# Patient Record
Sex: Female | Born: 1950 | Race: White | Hispanic: No | Marital: Married | Smoking: Never smoker
Health system: Southern US, Community
[De-identification: ages and names within clinical notes are randomized; demographics above are authoritative.]

## PROBLEM LIST (undated history)

## (undated) DIAGNOSIS — I10 Essential (primary) hypertension: Secondary | ICD-10-CM

## (undated) DIAGNOSIS — E785 Hyperlipidemia, unspecified: Secondary | ICD-10-CM

## (undated) DIAGNOSIS — K449 Diaphragmatic hernia without obstruction or gangrene: Secondary | ICD-10-CM

## (undated) DIAGNOSIS — F419 Anxiety disorder, unspecified: Secondary | ICD-10-CM

## (undated) DIAGNOSIS — E538 Deficiency of other specified B group vitamins: Secondary | ICD-10-CM

## (undated) DIAGNOSIS — K5190 Ulcerative colitis, unspecified, without complications: Secondary | ICD-10-CM

## (undated) DIAGNOSIS — K219 Gastro-esophageal reflux disease without esophagitis: Secondary | ICD-10-CM

## (undated) DIAGNOSIS — N3281 Overactive bladder: Secondary | ICD-10-CM

## (undated) DIAGNOSIS — J320 Chronic maxillary sinusitis: Secondary | ICD-10-CM

## (undated) DIAGNOSIS — R748 Abnormal levels of other serum enzymes: Secondary | ICD-10-CM

## (undated) DIAGNOSIS — K222 Esophageal obstruction: Secondary | ICD-10-CM

## (undated) DIAGNOSIS — K6289 Other specified diseases of anus and rectum: Secondary | ICD-10-CM

## (undated) DIAGNOSIS — M1990 Unspecified osteoarthritis, unspecified site: Secondary | ICD-10-CM

## (undated) HISTORY — PX: CATARACT EXTRACTION: SUR2

## (undated) HISTORY — DX: Essential (primary) hypertension: I10

## (undated) HISTORY — DX: Gastro-esophageal reflux disease without esophagitis: K21.9

## (undated) HISTORY — DX: Ulcerative colitis, unspecified, without complications: K51.90

## (undated) HISTORY — DX: Chronic maxillary sinusitis: J32.0

## (undated) HISTORY — DX: Diaphragmatic hernia without obstruction or gangrene: K44.9

## (undated) HISTORY — DX: Other specified diseases of anus and rectum: K62.89

## (undated) HISTORY — DX: Hyperlipidemia, unspecified: E78.5

## (undated) HISTORY — DX: Unspecified osteoarthritis, unspecified site: M19.90

## (undated) HISTORY — PX: NOSE SURGERY: SHX723

## (undated) HISTORY — DX: Anxiety disorder, unspecified: F41.9

## (undated) HISTORY — DX: Overactive bladder: N32.81

## (undated) HISTORY — DX: Abnormal levels of other serum enzymes: R74.8

## (undated) HISTORY — PX: OTHER SURGICAL HISTORY: SHX169

## (undated) HISTORY — DX: Deficiency of other specified B group vitamins: E53.8

## (undated) HISTORY — DX: Esophageal obstruction: K22.2

---

## 1997-09-18 ENCOUNTER — Emergency Department (HOSPITAL_COMMUNITY): Admission: EM | Admit: 1997-09-18 | Discharge: 1997-09-18 | Payer: Self-pay | Admitting: Emergency Medicine

## 1998-01-04 ENCOUNTER — Encounter: Payer: Self-pay | Admitting: Obstetrics and Gynecology

## 1998-01-04 ENCOUNTER — Ambulatory Visit (HOSPITAL_COMMUNITY): Admission: RE | Admit: 1998-01-04 | Discharge: 1998-01-04 | Payer: Self-pay | Admitting: Obstetrics and Gynecology

## 1998-10-18 ENCOUNTER — Ambulatory Visit (HOSPITAL_COMMUNITY): Admission: RE | Admit: 1998-10-18 | Discharge: 1998-10-18 | Payer: Self-pay | Admitting: Gastroenterology

## 1998-10-18 ENCOUNTER — Encounter: Payer: Self-pay | Admitting: Gastroenterology

## 1998-11-07 ENCOUNTER — Encounter (INDEPENDENT_AMBULATORY_CARE_PROVIDER_SITE_OTHER): Payer: Self-pay | Admitting: Specialist

## 1998-11-07 ENCOUNTER — Other Ambulatory Visit: Admission: RE | Admit: 1998-11-07 | Discharge: 1998-11-07 | Payer: Self-pay | Admitting: Gastroenterology

## 1999-01-23 ENCOUNTER — Ambulatory Visit (HOSPITAL_COMMUNITY): Admission: RE | Admit: 1999-01-23 | Discharge: 1999-01-23 | Payer: Self-pay | Admitting: Obstetrics and Gynecology

## 1999-01-23 ENCOUNTER — Encounter: Payer: Self-pay | Admitting: Obstetrics and Gynecology

## 1999-01-24 ENCOUNTER — Other Ambulatory Visit: Admission: RE | Admit: 1999-01-24 | Discharge: 1999-01-24 | Payer: Self-pay | Admitting: Obstetrics and Gynecology

## 2000-01-25 ENCOUNTER — Ambulatory Visit (HOSPITAL_COMMUNITY): Admission: RE | Admit: 2000-01-25 | Discharge: 2000-01-25 | Payer: Self-pay | Admitting: Obstetrics and Gynecology

## 2000-01-25 ENCOUNTER — Encounter: Payer: Self-pay | Admitting: Obstetrics and Gynecology

## 2000-01-31 ENCOUNTER — Other Ambulatory Visit: Admission: RE | Admit: 2000-01-31 | Discharge: 2000-01-31 | Payer: Self-pay | Admitting: Obstetrics and Gynecology

## 2001-12-16 ENCOUNTER — Encounter: Payer: Self-pay | Admitting: Obstetrics and Gynecology

## 2001-12-16 ENCOUNTER — Other Ambulatory Visit: Admission: RE | Admit: 2001-12-16 | Discharge: 2001-12-16 | Payer: Self-pay | Admitting: Obstetrics and Gynecology

## 2001-12-16 ENCOUNTER — Ambulatory Visit (HOSPITAL_COMMUNITY): Admission: RE | Admit: 2001-12-16 | Discharge: 2001-12-16 | Payer: Self-pay | Admitting: Obstetrics and Gynecology

## 2004-11-02 ENCOUNTER — Encounter: Admission: RE | Admit: 2004-11-02 | Discharge: 2004-11-02 | Payer: Self-pay | Admitting: Obstetrics and Gynecology

## 2005-09-16 ENCOUNTER — Inpatient Hospital Stay (HOSPITAL_COMMUNITY): Admission: EM | Admit: 2005-09-16 | Discharge: 2005-09-19 | Payer: Self-pay | Admitting: Family Medicine

## 2005-09-16 ENCOUNTER — Ambulatory Visit: Payer: Self-pay | Admitting: Family Medicine

## 2007-02-25 ENCOUNTER — Ambulatory Visit: Payer: Self-pay | Admitting: Gastroenterology

## 2007-02-25 DIAGNOSIS — K5150 Left sided colitis without complications: Secondary | ICD-10-CM

## 2007-03-10 ENCOUNTER — Encounter: Payer: Self-pay | Admitting: Gastroenterology

## 2007-03-10 ENCOUNTER — Ambulatory Visit: Payer: Self-pay | Admitting: Gastroenterology

## 2007-03-14 ENCOUNTER — Ambulatory Visit: Payer: Self-pay | Admitting: Gastroenterology

## 2007-03-14 LAB — CONVERTED CEMR LAB
ALT: 22 units/L (ref 0–35)
AST: 18 units/L (ref 0–37)
Albumin: 3.7 g/dL (ref 3.5–5.2)
Albumin: 4 g/dL (ref 3.5–5.2)
Alkaline Phosphatase: 96 units/L (ref 39–117)
Alkaline Phosphatase: 87 units/L (ref 39–117)
BUN: 11 mg/dL (ref 6–23)
BUN: 11 mg/dL (ref 6–23)
Basophils Absolute: 0.1 10*3/uL (ref 0.0–0.1)
Basophils Relative: 1.5 % — ABNORMAL HIGH (ref 0.0–1.0)
CO2: 28 meq/L (ref 19–32)
Calcium: 8.9 mg/dL (ref 8.4–10.5)
Chloride: 104 meq/L (ref 96–112)
Ferritin: 158.7 ng/mL (ref 10.0–291.0)
Folate: 10.1 ng/mL
GFR calc Af Amer: 134 mL/min
GFR calc Af Amer: 112 mL/min
GFR calc non Af Amer: 93 mL/min
Hemoglobin: 13.6 g/dL (ref 12.0–15.0)
Iron: 44 ug/dL (ref 42–145)
MCHC: 34.9 g/dL (ref 30.0–36.0)
Monocytes Absolute: 0.5 10*3/uL (ref 0.2–0.7)
Monocytes Relative: 6.5 % (ref 3.0–11.0)
Potassium: 3.6 meq/L (ref 3.5–5.1)
RBC: 4.54 M/uL (ref 3.87–5.11)
RDW: 12 % (ref 11.5–14.6)
Saturation Ratios: 14.1 % — ABNORMAL LOW (ref 20.0–50.0)
Saturation Ratios: 13.7 % — ABNORMAL LOW (ref 20.0–50.0)
Sodium: 141 meq/L (ref 135–145)
TSH: 0.83 microintl units/mL (ref 0.35–5.50)
Total Protein: 6.8 g/dL (ref 6.0–8.3)
Transferrin: 222.9 mg/dL (ref >=212.0)
Vitamin B-12: 389 pg/mL (ref 211–911)

## 2007-04-24 DIAGNOSIS — M810 Age-related osteoporosis without current pathological fracture: Secondary | ICD-10-CM | POA: Insufficient documentation

## 2007-04-24 DIAGNOSIS — K219 Gastro-esophageal reflux disease without esophagitis: Secondary | ICD-10-CM

## 2007-04-24 DIAGNOSIS — J320 Chronic maxillary sinusitis: Secondary | ICD-10-CM

## 2007-11-28 ENCOUNTER — Telehealth: Payer: Self-pay | Admitting: Gastroenterology

## 2008-01-26 ENCOUNTER — Telehealth: Payer: Self-pay | Admitting: Gastroenterology

## 2008-01-29 DIAGNOSIS — F411 Generalized anxiety disorder: Secondary | ICD-10-CM | POA: Insufficient documentation

## 2008-01-29 DIAGNOSIS — I10 Essential (primary) hypertension: Secondary | ICD-10-CM

## 2008-01-30 ENCOUNTER — Ambulatory Visit: Payer: Self-pay | Admitting: Gastroenterology

## 2008-01-30 DIAGNOSIS — E538 Deficiency of other specified B group vitamins: Secondary | ICD-10-CM | POA: Insufficient documentation

## 2008-02-06 ENCOUNTER — Ambulatory Visit: Payer: Self-pay | Admitting: Gastroenterology

## 2008-04-01 ENCOUNTER — Encounter: Admission: RE | Admit: 2008-04-01 | Discharge: 2008-04-01 | Payer: Self-pay | Admitting: Emergency Medicine

## 2009-12-06 ENCOUNTER — Emergency Department (HOSPITAL_COMMUNITY): Admission: EM | Admit: 2009-12-06 | Discharge: 2009-12-07 | Payer: Self-pay | Admitting: Emergency Medicine

## 2010-03-30 ENCOUNTER — Encounter: Payer: Self-pay | Admitting: Gastroenterology

## 2010-04-07 ENCOUNTER — Ambulatory Visit
Admission: RE | Admit: 2010-04-07 | Discharge: 2010-04-07 | Payer: Self-pay | Source: Home / Self Care | Attending: Gastroenterology | Admitting: Gastroenterology

## 2010-04-07 ENCOUNTER — Encounter (INDEPENDENT_AMBULATORY_CARE_PROVIDER_SITE_OTHER): Payer: Self-pay | Admitting: *Deleted

## 2010-04-07 ENCOUNTER — Other Ambulatory Visit: Payer: Self-pay | Admitting: Gastroenterology

## 2010-04-07 ENCOUNTER — Encounter: Payer: Self-pay | Admitting: Gastroenterology

## 2010-04-07 ENCOUNTER — Ambulatory Visit: Payer: Self-pay | Admitting: Internal Medicine

## 2010-04-07 DIAGNOSIS — R740 Nonspecific elevation of levels of transaminase and lactic acid dehydrogenase [LDH]: Secondary | ICD-10-CM

## 2010-04-07 DIAGNOSIS — K222 Esophageal obstruction: Secondary | ICD-10-CM | POA: Insufficient documentation

## 2010-04-07 DIAGNOSIS — K5120 Ulcerative (chronic) proctitis without complications: Secondary | ICD-10-CM | POA: Insufficient documentation

## 2010-04-07 DIAGNOSIS — M129 Arthropathy, unspecified: Secondary | ICD-10-CM | POA: Insufficient documentation

## 2010-04-07 DIAGNOSIS — K449 Diaphragmatic hernia without obstruction or gangrene: Secondary | ICD-10-CM | POA: Insufficient documentation

## 2010-04-07 DIAGNOSIS — R1012 Left upper quadrant pain: Secondary | ICD-10-CM | POA: Insufficient documentation

## 2010-04-07 LAB — CONVERTED CEMR LAB
Anti Nuclear Antibody(ANA): NEGATIVE
HCV Ab: NEGATIVE

## 2010-04-07 LAB — COMPREHENSIVE METABOLIC PANEL
ALT: 47 U/L — ABNORMAL HIGH (ref 0–35)
AST: 26 U/L (ref 0–37)
Albumin: 4.1 g/dL (ref 3.5–5.2)
Alkaline Phosphatase: 101 U/L (ref 39–117)
BUN: 15 mg/dL (ref 6–23)
CO2: 29 mEq/L (ref 19–32)
Calcium: 9.1 mg/dL (ref 8.4–10.5)
Chloride: 105 mEq/L (ref 96–112)
Creatinine, Ser: 0.8 mg/dL (ref 0.4–1.2)
GFR: 74.17 mL/min (ref >60.00)
Glucose, Bld: 81 mg/dL (ref 70–99)
Potassium: 4 mEq/L (ref 3.5–5.1)
Sodium: 141 mEq/L (ref 135–145)
Total Bilirubin: 1 mg/dL (ref 0.3–1.2)
Total Protein: 6.9 g/dL (ref 6.0–8.3)

## 2010-04-07 LAB — AMYLASE: Amylase: 56 U/L (ref 27–131)

## 2010-04-07 LAB — FOLATE: Folate: 15.5 ng/mL

## 2010-04-07 LAB — IBC PANEL
Iron: 115 ug/dL (ref 42–145)
Saturation Ratios: 29.2 % (ref 20.0–50.0)
Transferrin: 281 mg/dL (ref 212.0–360.0)

## 2010-04-07 LAB — FERRITIN: Ferritin: 121.3 ng/mL (ref 10.0–291.0)

## 2010-04-07 LAB — LIPASE: Lipase: 30 U/L (ref 11.0–59.0)

## 2010-04-07 LAB — VITAMIN B12: Vitamin B-12: 285 pg/mL (ref 211–911)

## 2010-04-07 LAB — SEDIMENTATION RATE: Sed Rate: 20 mm/hr (ref 0–22)

## 2010-04-07 LAB — HIGH SENSITIVITY CRP: CRP, High Sensitivity: 2.34 mg/L (ref 0.00–5.00)

## 2010-04-09 ENCOUNTER — Telehealth: Payer: Self-pay | Admitting: Gastroenterology

## 2010-04-12 ENCOUNTER — Encounter: Payer: Self-pay | Admitting: Gastroenterology

## 2010-04-12 ENCOUNTER — Ambulatory Visit
Admission: RE | Admit: 2010-04-12 | Discharge: 2010-04-12 | Payer: Self-pay | Source: Home / Self Care | Attending: Gastroenterology | Admitting: Gastroenterology

## 2010-04-18 ENCOUNTER — Encounter: Payer: Self-pay | Admitting: Gastroenterology

## 2010-04-18 ENCOUNTER — Telehealth: Payer: Self-pay | Admitting: Gastroenterology

## 2010-05-02 ENCOUNTER — Ambulatory Visit
Admission: RE | Admit: 2010-05-02 | Discharge: 2010-05-02 | Payer: Self-pay | Source: Home / Self Care | Attending: Gastroenterology | Admitting: Gastroenterology

## 2010-05-02 DIAGNOSIS — R0789 Other chest pain: Secondary | ICD-10-CM | POA: Insufficient documentation

## 2010-05-04 NOTE — Progress Notes (Signed)
Summary: Questions   Phone Note Call from Patient Call back at [phone]   Caller: Patient Call For: Dr. Sharlett Iles Reason for Call: Talk to Nurse Summary of Call: Pt just got the machine we ordered for her to help with her lung and she does not know how to use it, would like to speak to nurse Initial call taken by: Martinique Johnson,  April 18, 2010 8:54 AM  Follow-up for Phone Call        advised pt that per Dr. Sharlett Iles she can return the machine and just take deep breaths. She veralized understanding and will come to her appt at the end of the month. Follow-up by: Bernita Buffy CMA Deborra Medina),  April 18, 2010 9:39 AM

## 2010-05-04 NOTE — Assessment & Plan Note (Signed)
Summary: increased lts/lk    History of Present Illness Visit Type: Initial Consult Primary GI MD: Verl Blalock MD FACP FAGA Primary Provider: Nena Jordan, MD  Requesting Provider: Nena Jordan, MD  Chief Complaint: Elevated liver function test  History of Present Illness:   Jessica Stephens is a 60 year old Caucasian female lab none for many years with left-sided ulcerative colitis. Last colonoscopy was in 2008 and showed mild activity but no dysplasia on screening biopsies. She has been maintained on Asacol 4 mg twice a day which she apparently does not take regularly. 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 basis. She is followed by Dr. Ivar Bury and is on lisinopril and Amiodipine or hypertension.  She recently has had left-sided pleuritic chest pain which prompted emergency room visit at Thanksgiving. Apparently chest x-ray, EKG, and general exam was unremarkable. She apparently was recently discovered to have a urinary tract infection and was treated with antibiotics by primary care. She denies any specific genitourinary, cardiovascular, or pulmonary complaints at this time except for left-sided chest pain worsening with deep inspiration. She denies worsening acid reflux, dysphagia, or flare of her colitis. In fact, she has constipation with some gas and bloating, but denies rectal bleeding, fever, chills, anorexia or weight loss.  As part of her workup she been found to have mildly elevated serum transaminases approximately twice normal. She has no known hepatitis exposure, and liver enzymes in 2008 were normal. She does have osteoarthritis, osteoporosis, has recently discontinued her calcium, vitamin D, and Fosamax. She does use ethanol apparently fairly heavily. She denies a known history of hepatitis or pancreatitis. Her main complaint today is chronic fatigue. There is no history of mental status changes, pleuritis,  icterus, or known liver or gallbladder disease. She does suffer from chronic anxiety syndrome.   GI Review of Systems      Denies abdominal pain, acid reflux, belching, bloating, chest pain, dysphagia with liquids, dysphagia with solids, heartburn, loss of appetite, nausea, vomiting, vomiting blood, weight loss, and  weight gain.      Reports liver problems.     Denies anal fissure, black tarry stools, change in bowel habit, constipation, diarrhea, diverticulosis, fecal incontinence, heme positive stool, hemorrhoids, irritable bowel syndrome, jaundice, light color stool, rectal bleeding, and  rectal pain.    Current Medications (verified): 1)  Asacol 400 Mg  Tbec (Mesalamine) .... Take Two By Mouth Once Daily 2)  Nexium 40 Mg  Cpdr (Esomeprazole Magnesium) .... As Needed 3)  Lisinopril 10 Mg Tabs (Lisinopril) .Marland Kitchen.. 1 Tablet By Mouth Once Daily 4)  Amlodipine Besylate 10 Mg Tabs (Amlodipine Besylate) .Marland Kitchen.. 1 Tablet By Mouth Once Daily  Allergies (verified): 1)  ! Sulfa  Past History:  Past medical, surgical, family and social histories (including risk factors) reviewed for relevance to current acute and chronic problems.  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 c-section x 2 Nose surgery  Family History: Reviewed history from 01/29/2008 and no changes required. No FH of Colon Cancer: Family History of Diabetes: father Family History of Heart Disease:   Social History: Reviewed history from 01/30/2008 and no changes required. 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  Alcohol Use - no Illicit Drug Use - no Daily Caffeine Use  Review of Systems       The patient complains of arthritis/joint pain, back pain, blood in urine, and fatigue.  The patient denies allergy/sinus, anemia, anxiety-new, breast changes/lumps, change in vision, confusion, cough, coughing up blood, depression-new, fainting, fever, headaches-new, hearing problems, heart murmur, heart rhythm changes, itching, menstrual pain, muscle pains/cramps, night sweats, nosebleeds, pregnancy symptoms, shortness of breath, skin rash, sleeping problems, sore throat, swelling of feet/legs, swollen lymph glands, thirst - excessive , urination - excessive , urination changes/pain, urine leakage, vision changes, and voice change.    Vital Signs:  Patient profile:   60 year old female Height:      62 inches Weight:      160 pounds BMI:     29.37 BSA:     1.74 Pulse rate:   84 / minute Pulse rhythm:   regular BP sitting:   128 / 74  (left arm) Cuff size:   regular  Vitals Entered By: Hope Pigeon CMA (April 07, 2010 8:58 AM)  Physical Exam  General:  Well developed, well nourished, no acute distress.Somewhat agitated very verbose patient. Head:  Normocephalic and atraumatic. Eyes:  PERRLA, no icterus.exam deferred to patient's ophthalmologist.   Neck:  Supple; no masses or thyromegaly. Lungs:  Clear throughout to auscultation. Heart:  Regular rate and rhythm; no murmurs, rubs,  or bruits. Abdomen:  Soft, nontender and nondistended. No masses, hepatosplenomegaly or hernias noted. Normal bowel sounds.No CVA tenderness noted. Rectal:  deferred Msk:  Symmetrical with no gross deformities. Normal posture. Extremities:  No clubbing, cyanosis, edema or deformities noted. Neurologic:  Alert and  oriented x4;  grossly normal neurologically. Cervical Nodes:  No significant cervical adenopathy. Psych:  hyperactive and agitated.     Impression & Recommendations:  Problem # 1:  ULCERATIVE PROCTITIS  (ICD-556.2) Assessment Improved Continue Asacol as tolerated. She will need followup colonoscopy in the near future. I have ordered CT scan of the abdomen and pelvis to evaluate her rather severe left-sided pleuritic type chest pain. Labs also in order. I have given her p.r.n. tramadol 50 mg every 6-8 hours.She has been placed on MiraLax 8 ounces q.h.s. for constipation. Orders: TLB-CMP (Comprehensive Metabolic Pnl) (24097-DZHG) TLB-B12, Serum-Total ONLY (99242-A83) TLB-Ferritin (41962-IWL) TLB-Folic Acid (Folate) (79892-JJH) TLB-IBC Pnl (Iron/FE;Transferrin) (83550-IBC) TLB-Amylase (82150-AMYL) TLB-Lipase (83690-LIPASE) TLB-CRP-High Sensitivity (C-Reactive Protein) (86140-FCRP) TLB-Sedimentation Rate (ESR) (85652-ESR) T-ANA (41740-81448) T-Hepatitis B Surface Antigen (18563-14970) T-Hepatitis C Anti HCV (26378) T-CDT (carbohydrate deficient transferrin) (58850-27741) CT Chest/Abdomen w IV and Oral Contrast (CT CH/ABD w IV/Oral )  Problem # 2:  LUQ PAIN (ICD-789.02) Assessment: Deteriorated workup as per above.p.r.n. tramadol ordered. Orders: TLB-CMP (Comprehensive Metabolic Pnl) (28786-VEHM) TLB-B12, Serum-Total ONLY (09470-J62) TLB-Ferritin (83662-HUT) TLB-Folic Acid (Folate) (65465-KPT) TLB-IBC Pnl (Iron/FE;Transferrin) (83550-IBC) TLB-Amylase (82150-AMYL) TLB-Lipase (83690-LIPASE) TLB-CRP-High Sensitivity (C-Reactive Protein) (86140-FCRP) TLB-Sedimentation Rate (ESR) (85652-ESR) T-ANA (46568-12751) T-Hepatitis B Surface Antigen (70017-49449) T-Hepatitis C Anti HCV (67591) T-CDT (carbohydrate deficient transferrin) (63846-65993) CT Chest/Abdomen w IV and Oral Contrast (CT CH/ABD w IV/Oral )  Problem # 3:  VITAMIN B12 DEFICIENCY (ICD-266.2) Assessment: Improved repeat labs ordered  Problem # 4:  ANXIETY, CHRONIC (ICD-300.00) Assessment: Deteriorated Consider treatment for anxiety-depression.  Problem # 5:  HIATAL HERNIA (ICD-553.3) Assessment: Improved Patient uses  p.r.n. Nexium and apparently is asymptomatic at this time. However, some of her chest pain may be related to GERD, and I suspect she will need repeat endoscopy and colonoscopy in followup.  Problem # 6:  HYPERTENSION (ICD-401.9) Assessment: Improved Blood Pressure today is 120/74 and she is to continue her antihypertensives as per primary care.  Problem # 7:  TRANSAMINASES, SERUM, ELEVATED (ICD-790.4) Assessment: Deteriorated Etiology of elevated liver enzymes unclear, rule out autoimmune disease, chronic active hepatitis, iron storage disease, versus ethanol abuse and fatty liver versus occult hepatobiliary inflammatory processes. CT Scan and labs have been ordered. She is not on any medications that are typically associated with drug hepatitis. There is certainly no evidence of chronic liver disease on exam or evidence of hepatosplenomegaly.  Patient Instructions: 1)  Copy sent to : Nena Jordan, MD 2)  Please go to the basement today for your labs.  3)  Your CT Scan is scheduled for today, please follow the seperate instructions. 4)  Please continue current medications.  5)  Your prescription(s) have been sent to you pharmacy.  6)  Take Miralax at night. 7)  The medication list was reviewed and reconciled.  All changed / newly prescribed medications were explained.  A complete medication list was provided to the patient / caregiver. Prescriptions: TRAMADOL HCL 50 MG TABS (TRAMADOL HCL) take one by mouth two times a day as needed  #60 x 3   Entered by:   Bernita Buffy CMA ([place])   Authorized by:   Sable Feil MD [facility]   Signed by:   Bernita Buffy CMA (AAMA) on 04/07/2010   Method used:   Faxed to ...       [facility] (retail)       [address]       [place], [place]  [address]       Ph: [phone]       Fax: [phone]   [place]:   [phone]

## 2010-05-04 NOTE — Letter (Signed)
Summary: Patient [facility] Biopsy Results  [facility]  [place], [place] [address]   Phone: [phone]  Fax: [phone]        April 18, 2010 MRN: [id number]    Jessica Stephens [address] [place], [place]  [address]    Dear Ms. Liss,  I am pleased to inform you that the biopsies taken during your recent endoscopic examination did not show any evidence of cancer upon pathologic examination.Small bowel biopsies did not show evidence of celiac disease.  Additional information/recommendations:  __No further action is needed at this time.  Please follow-up with      your primary care physician for your other healthcare needs.  __ Please call [phone] to schedule a return visit to review      your condition.  _x_ Continue with the treatment plan as outlined on the day of your      exam.  __ You should have a repeat endoscopic examination for this problem              in _ months/years.   Please call us if you are having persistent problems or have questions about your condition that have not been fully answered at this time.  Sincerely,  Sable Feil MD [facility]  This letter has been electronically signed by your physician.  Appended Document: Patient Notice-Endo Biopsy Results Letter mailed

## 2010-05-04 NOTE — Letter (Signed)
Summary: [facility] Instructions  [facility]  [place], [place] [address]   Phone: [phone]  Fax: [phone]       Jessica Stephens    1950-07-17    MRN: [id number]        Procedure Day Sudie Grumbling: Wednesday 04-12-10     Arrival Time: 2:00 pm     Procedure Time: 3:00 pm     Location of Procedure:                    _x _  [place] (4th Floor)                        [place]   Starting 5 days prior to your procedure  04-07-10(today) do not eat nuts, seeds, popcorn, corn, beans, peas,  salads, or any raw vegetables.  Do not take any fiber supplements (e.g. Metamucil, Citrucel, and Benefiber).  THE DAY BEFORE YOUR PROCEDURE         DATE: 04-11-10   DAY:  Tuesday   1.  Drink clear liquids the entire day-NO SOLID FOOD  2.  Do not drink anything colored red or purple.  Avoid juices with pulp.  No orange juice.  3.  Drink at least 64 oz. (8 glasses) of fluid/clear liquids during the day to prevent dehydration and help the prep work efficiently.  CLEAR LIQUIDS INCLUDE: Water Jello Ice Popsicles Tea (sugar ok, no milk/cream) Powdered fruit flavored drinks Coffee (sugar ok, no milk/cream) Gatorade Juice: apple, white grape, white cranberry  Lemonade Clear bullion, consomm, broth Carbonated beverages (any kind) Strained chicken noodle soup Hard Candy                             4.  In the morning, mix first dose of MoviPrep solution:    Empty 1 Pouch A and 1 Pouch B into the disposable container    Add lukewarm drinking water to the top line of the container. Mix to dissolve    Refrigerate (mixed solution should be used within 24 hrs)  5.  Begin drinking the prep at 5:00 p.m. The MoviPrep container is divided by 4 marks.   Every 15 minutes drink the solution down to the next mark (approximately 8 oz) until the full liter is complete.   6.  Follow completed prep with 16 oz of clear liquid of your  choice (Nothing red or purple).  Continue to drink clear liquids until bedtime.  7.  Before going to bed, mix second dose of MoviPrep solution:    Empty 1 Pouch A and 1 Pouch B into the disposable container    Add lukewarm drinking water to the top line of the container. Mix to dissolve    Refrigerate  THE DAY OF YOUR PROCEDURE      DATE:  04-12-10  DAY:  Wednesday  Beginning at  10:00 a.m. (5 hours before procedure):         1. Every 15 minutes, drink the solution down to the next mark (approx 8 oz) until the full liter is complete.  2. Follow completed prep with 16 oz. of clear liquid of your choice.    3. You may drink clear liquids until  1:00 p.m. (2 HOURS BEFORE PROCEDURE).   MEDICATION INSTRUCTIONS  Unless otherwise instructed, you should take regular prescription medications with a small sip of water  as early as possible the morning of your procedure.           OTHER INSTRUCTIONS  You will need a responsible adult at least 58 years of age to accompany you and drive you home.   This person must remain in the waiting room during your procedure.  Wear loose fitting clothing that is easily removed.  Leave jewelry and other valuables at home.  However, you may wish to bring a book to read or  an iPod/MP3 player to listen to music as you wait for your procedure to start.  Remove all body piercing jewelry and leave at home.  Total time from sign-in until discharge is approximately 2-3 hours.  You should go home directly after your procedure and rest.  You can resume normal activities the  day after your procedure.  The day of your procedure you should not:   Drive   Make legal decisions   Operate machinery   Drink alcohol   Return to work  You will receive specific instructions about eating, activities and medications before you leave.    The above instructions have been reviewed and explained to me by   Sundra Aland RN  April 07, 2010 5:18  PM    I fully understand and can verbalize these instructions _____________________________ Date _________

## 2010-05-04 NOTE — Progress Notes (Signed)
   Phone Note Call from Patient   Caller: Patient Summary of Call: On call note. Pt called stating MoviPrep is too expensive and she wants another bowel prep. Change to generic PEG solution, 2 liter split dosing protocol, in place of MoviPrep. [facility] to [facility] on [address]. Initial call taken by: Ladene Artist MD Marval Regal,  April 09, 2010 11:44 AM

## 2010-05-04 NOTE — Miscellaneous (Signed)
Summary: Dexilant Rx and samples  Clinical Lists Changes  Medications: Changed medication from [place] 40 MG  CPDR (ESOMEPRAZOLE MAGNESIUM) as needed to DEXILANT 60 MG CPDR (DEXLANSOPRAZOLE) take one by mouth once daily - Signed Rx of DEXILANT 60 MG CPDR (DEXLANSOPRAZOLE) take one by mouth once daily;  #30 x 6;  Signed;  Entered by: Bernita Buffy CMA (AAMA);  Authorized by: Sable Feil MD [facility];  Method used: Electronically to [facility] [address] [place], [place], [place]  [address], Ph: [phone], Fax: [phone]    Prescriptions: DEXILANT 60 MG CPDR (DEXLANSOPRAZOLE) take one by mouth once daily  #30 x 6   Entered by:   Bernita Buffy CMA ([place])   Authorized by:   Sable Feil MD [facility]   Signed by:   Bernita Buffy CMA ([place]) on 04/12/2010   Method used:   Electronically to        [facility] (retail)       [address]       [place], [place]  [address]       Ph: [phone]       Fax: [phone]   [place]:   [phone]

## 2010-05-04 NOTE — Procedures (Signed)
Summary: Colonoscopy  Patient: Jessica Stephens Note: All result statuses are Final unless otherwise noted.  Tests: (1) Colonoscopy (COL)   COL Colonoscopy           [facility].     [place], [place]  [address]           COLONOSCOPY PROCEDURE REPORT           PATIENT:  [name], [name]  MR#:  [id number]     BIRTHDATE:  [birth year], 63 yrs. old  GENDER:  female     ENDOSCOPIST:  Loralee Pacas. Sharlett Iles, MD, [facility]     REF. BY:     PROCEDURE DATE:  04/12/2010     PROCEDURE:  Average-risk screening colonoscopy     G0121     ASA CLASS:  Class II     INDICATIONS:  Abdominal pain, extent of chronic inflammatory bowel     disease     MEDICATIONS:   Fentanyl 50 mcg IV, Versed 7 mg IV           DESCRIPTION OF PROCEDURE:   After the risks benefits and     alternatives of the procedure were thoroughly explained, informed     consent was obtained.  Digital rectal exam was performed and     revealed no abnormalities.   The LB 180AL [id number] endoscope was     introduced through the anus and advanced to the cecum, which was     identified by both the appendix and ileocecal valve, without     limitations.  The quality of the prep was excellent, using     MoviPrep.  The instrument was then slowly withdrawn as the colon     was fully examined.     <<PROCEDUREIMAGES>>           FINDINGS:  No polyps or cancers were seen.  This was otherwise a     normal examination of the colon. NO ACTIVE COLITIS NOTED.     Retroflexed views in the rectum revealed no abnormalities.    The     scope was then withdrawn from the patient and the procedure     completed.           COMPLICATIONS:  None     ENDOSCOPIC IMPRESSION:     1) No polyps or cancers     2) Otherwise normal examination     RECOMMENDATIONS:     1) Repeat Colonoscopy in 5 years.     CONTINUE MEDS     REPEAT EXAM:  No           ______________________________     Loralee Pacas. Sharlett Iles, MD, [facility]           CC:           n.     eSIGNED:   Loralee Pacas. Sriansh Farra at 04/12/2010 04:05 PM           [place], [id number]  Note: An exclamation mark (!) indicates a result that was not dispersed into the flowsheet. Document Creation Date: 04/12/2010 4:05 PM _______________________________________________________________________  (1) Order result status: Final Collection or observation date-time: 04/12/2010 15:59 Requested date-time:  Receipt date-time:  Reported date-time:  Referring Physician:   Ordering Physician: Verl Blalock [phone]) Specimen Source:  Source: Tawanna Cooler Order Number: [id number] Lab site:   Appended Document: Colonoscopy    Clinical Lists Changes  Observations: Added new observation of  COLONNXTDUE: 04/2015 (04/12/2010 16:50)      Appended Document: Colonoscopy     Procedures Next Due Date:    Colonoscopy: 04/2015

## 2010-05-04 NOTE — Procedures (Signed)
Summary: Upper Endoscopy  Patient: [name] Note: All result statuses are Final unless otherwise noted.  Tests: (1) Upper Endoscopy (EGD)   EGD Upper Endoscopy       [facility].     [place], [place]  [address]           ENDOSCOPY PROCEDURE REPORT           PATIENT:  Jessica Stephens, Jessica Stephens  MR#:  [id number]     BIRTHDATE:  September 20, 1950, 45 yrs. old  GENDER:  female           ENDOSCOPIST:  Loralee Pacas. Sharlett Iles, MD, [facility]     Referred by:           PROCEDURE DATE:  04/12/2010     PROCEDURE:  EGD with biopsy, [address]     ASA CLASS:  Class II     INDICATIONS:  LUQ PAIN.HX. OF GALLSTONES           MEDICATIONS:   There was residual sedation effect present from     prior procedure., Fentanyl 25 mcg IV     TOPICAL ANESTHETIC:           DESCRIPTION OF PROCEDURE:   After the risks benefits and     alternatives of the procedure were thoroughly explained, informed     consent was obtained.  The LB GIF-H180 [id number] endoscope was     introduced through the mouth and advanced to the second portion of     the duodenum, without limitations.  The instrument was slowly     withdrawn as the mucosa was fully examined.     <<PROCEDUREIMAGES>>           Multiple erosions were found in the distal esophagus. SEE     PICTURES.  A hiatal hernia was found. SMALL 2CM HH NOTED.  Normal     duodenal folds were noted. SI BX. DONE.  The stomach was entered     and closely examined. The antrum, angularis, and lesser curvature     were well visualized, including a retroflexed view of the cardia     and fundus. The stomach wall was normally distensable. The scope     passed easily through the pylorus into the duodenum.     Retroflexed views revealed no abnormalities.    The scope was then     withdrawn from the patient and the procedure completed.           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     RECOMMENDATIONS:     1) Await biopsy results     2) follow-up o.v first available     DEXILANT 60MG/DAILY.           REPEAT EXAM:  No           ______________________________     Loralee Pacas. Sharlett Iles, MD, [facility]           CC:           n.     eSIGNED:   Loralee Pacas.  [place] at 04/12/2010 04:18 PM           [place], [id number]  Note: An exclamation mark (!) indicates a result that was not dispersed into the flowsheet. Document Creation Date: 04/12/2010 4:19 PM _______________________________________________________________________  (1) Order result status: Final Collection or observation date-time: 04/12/2010 16:11 Requested date-time:  Receipt date-time:  Reported date-time:  Referring Physician:   Ordering Physician: Verl Blalock [phone]) Specimen Source:  Source: Tawanna Cooler Order Number: [id number] Lab site:

## 2010-05-04 NOTE — Miscellaneous (Signed)
Summary: previsit prep/rm  Clinical Lists Changes  Medications: Added new medication of MOVIPREP 100 GM  SOLR (PEG-KCL-NACL-NASULF-NA ASC-C) As per prep instructions. - Signed Rx of MOVIPREP 100 GM  SOLR (PEG-KCL-NACL-NASULF-NA ASC-C) As per prep instructions.;  #1 x 0;  Signed;  Entered by: Sundra Aland RN;  Authorized by: Sable Feil MD [facility];  Method used: [facility] to [facility] [address], [place], [place], [place]  [address], Ph: [phone], Fax: [phone] Observations: Added new observation of ALLERGY REV: Done (04/07/2010 15:44)    Prescriptions: MOVIPREP 100 GM  SOLR (PEG-KCL-NACL-NASULF-NA ASC-C) As per prep instructions.  #1 x 0   Entered by:   Sundra Aland RN   Authorized by:   Sable Feil MD [facility]   Signed by:   Sundra Aland RN on 04/07/2010   Method used:   Electronically to        [facility] (retail)       [address]       [place], [place]  [address]       Ph: [phone]       Fax: [phone]   [place]:   [phone]

## 2010-05-10 NOTE — Assessment & Plan Note (Signed)
Summary: follow up ECL and Chest CT/lk    History of Present Illness Visit Type: Follow-up Visit Primary GI MD: Verl Blalock MD FACP FAGA Primary Provider: Nena Jordan, MD  Requesting Provider: na Chief Complaint: F/u from endo/colon and chest CT. Pt states that she feels better and denies any GI complaints. Pt does have a question about Asacol and Dexilant  History of Present Illness:   Better,some gas,reflux improved GERD,works 3 jobs and eats frequently,stools.more.frequent,pulmonary.better,she was found to have atelectasis and is doing pulmonary inhalations. As part of her workup her CT scan showed calcifications in abdominal aorta, her father had a heart attack at age 11, she is treated for hypertension, and has a history of hyperlipidemia. Statins were apparently discontinued by Dr. Osvaldo Angst because of liver function test abnormalities. CT Scan did show small gallstones but no evidence of gallbladder infection, pancreatitis, or prominent biliary structures.  Hepatitis workup and workup for causes of metabolic liver disease were normal. She does have a borderline B12 level. Overall the patient is much improved with a reflux on Dexilant 60 mg a day. This also has helped her bowel pattern. Her colonoscopy showed that she is in remission in terms of her IBD. She does continue on low-dose aminosalicylate.   GI Review of Systems      Denies abdominal pain, acid reflux, belching, bloating, chest pain, dysphagia with liquids, dysphagia with solids, heartburn, loss of appetite, nausea, vomiting, vomiting blood, weight loss, and  weight gain.        Denies anal fissure, black tarry stools, change in bowel habit, constipation, diarrhea, diverticulosis, fecal incontinence, heme positive stool, hemorrhoids, irritable bowel syndrome, jaundice, light color stool, liver problems, rectal bleeding, and  rectal pain.    Current Medications (verified): 1)  Asacol 400 Mg  Tbec (Mesalamine) .... Take Two By  Mouth Once Daily 2)  Dexilant 60 Mg Cpdr (Dexlansoprazole) .... Take One By Mouth Once Daily 3)  Lisinopril 10 Mg Tabs (Lisinopril) .Marland Kitchen.. 1 Tablet By Mouth Once Daily 4)  Amlodipine Besylate 10 Mg Tabs (Amlodipine Besylate) .Marland Kitchen.. 1 Tablet By Mouth Once Daily 5)  Tramadol Hcl 50 Mg Tabs (Tramadol Hcl) .... Take One By Mouth Two Times A Day As Needed 6)  Miralax  Powd (Polyethylene Glycol 3350) .... Mix One Capful in 8 Ounces of Water and Drink At Night As Needed  Allergies (verified): 1)  ! Sulfa  Past History:  Past medical, surgical, family and social histories (including risk factors) reviewed for relevance to current acute and chronic problems.  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 c-section x 2 Nose surgery  Family History: Reviewed history from 01/29/2008 and no changes required. No FH of Colon Cancer: Family History of Diabetes: father Family History of Heart Disease:   Social History: Reviewed history from 01/30/2008 and no changes required. 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  Alcohol Use - no Illicit Drug Use - no Daily Caffeine Use  Review of Systems       The patient complains of fatigue and shortness of breath.  The patient denies allergy/sinus, anemia, anxiety-new, arthritis/joint pain, back pain, blood in urine, breast changes/lumps, change in vision, confusion, cough, coughing up  blood, depression-new, fainting, fever, headaches-new, hearing  problems, heart murmur, heart rhythm changes, itching, menstrual pain, muscle pains/cramps, night sweats, nosebleeds, pregnancy symptoms, skin rash, sleeping problems, sore throat, swelling of feet/legs, swollen lymph glands, thirst - excessive , urination - excessive , urination changes/pain, urine leakage, vision changes, and voice change.    Vital Signs:  Patient profile:   60 year old female Height:      62 inches Weight:      156 pounds BMI:     28.64 BSA:     1.72 Pulse rate:   88 / minute Pulse rhythm:   regular BP sitting:   128 / 74  (left arm) Cuff size:   regular  Vitals Entered By: Hope Pigeon CMA (May 02, 2010 10:01 AM)  Physical Exam  General:  Well developed, well nourished, no acute distress.healthy appearing.  healthy appearing.   Head:  Normocephalic and atraumatic. Eyes:  PERRLA, no icterus.exam deferred to patient's ophthalmologist.  exam deferred to patient's ophthalmologist.   Psych:  Alert and cooperative. Normal mood and affect.easily distracted.  easily distracted.     Impression & Recommendations:  Problem # 1:  TRANSAMINASES, SERUM, ELEVATED (ICD-790.4) Assessment Improved I see no reason why she could not restart for her elevated cholesterol levels. Hepatic workup otherwise has been unremarkable except for asymptomatic gallstones. I will refer her to cardiology for atypical chest pain, family history of coronary artery disease in her father, her hypertension, and her hyperlipidemia. She probably needs cardiac stress testing.  Problem # 2:  ULCERATIVE PROCTITIS (ICD-556.2) Assessment: Improved continue Asacol as listed. She needs followup colonoscopy every 5 years with dysplasia biopsies.  Problem # 3:  LUQ PAIN (ICD-789.02) Assessment: Improved Questionably related to  atelectasis of unexplained etiology.  Problem # 4:  ESOPHAGEAL STRICTURE (ICD-530.3) Assessment: Improved Continue anti-reflex maneuvers a daily PPI therapy as tolerated. She may  need periodic dilations depending on her symptomatology.  Other Orders: Cardiology Referral (Cardiology)  Patient Instructions: 1)  Copy sent to : Nena Jordan, MD  & Dr. Stanford Breed 2)  Please continue current medications.  3)  We have referred you to see Dr. Stanford Breed at [facility] the address is [place] [address] [address]. The Number is [phone] please call 24 hours in advance if you can not keep this appt. Date:05/29/2010 Time:12 (noon) 4)  The medication list was reviewed and reconciled.  All changed / newly prescribed medications were explained.  A complete medication list was provided to the patient / caregiver.

## 2010-05-29 ENCOUNTER — Ambulatory Visit (INDEPENDENT_AMBULATORY_CARE_PROVIDER_SITE_OTHER): Payer: 59 | Admitting: Cardiology

## 2010-05-29 ENCOUNTER — Encounter: Payer: Self-pay | Admitting: Cardiology

## 2010-05-29 DIAGNOSIS — E785 Hyperlipidemia, unspecified: Secondary | ICD-10-CM | POA: Insufficient documentation

## 2010-05-29 DIAGNOSIS — R0602 Shortness of breath: Secondary | ICD-10-CM

## 2010-05-29 DIAGNOSIS — R072 Precordial pain: Secondary | ICD-10-CM

## 2010-05-29 DIAGNOSIS — I10 Essential (primary) hypertension: Secondary | ICD-10-CM

## 2010-06-05 ENCOUNTER — Telehealth (INDEPENDENT_AMBULATORY_CARE_PROVIDER_SITE_OTHER): Payer: Self-pay | Admitting: *Deleted

## 2010-06-06 ENCOUNTER — Encounter: Payer: Self-pay | Admitting: Cardiology

## 2010-06-06 ENCOUNTER — Ambulatory Visit (HOSPITAL_COMMUNITY): Payer: 59 | Attending: Cardiology

## 2010-06-06 DIAGNOSIS — R0609 Other forms of dyspnea: Secondary | ICD-10-CM | POA: Insufficient documentation

## 2010-06-06 DIAGNOSIS — E785 Hyperlipidemia, unspecified: Secondary | ICD-10-CM | POA: Insufficient documentation

## 2010-06-06 DIAGNOSIS — R42 Dizziness and giddiness: Secondary | ICD-10-CM | POA: Insufficient documentation

## 2010-06-06 DIAGNOSIS — M549 Dorsalgia, unspecified: Secondary | ICD-10-CM | POA: Insufficient documentation

## 2010-06-06 DIAGNOSIS — R0989 Other specified symptoms and signs involving the circulatory and respiratory systems: Secondary | ICD-10-CM | POA: Insufficient documentation

## 2010-06-06 DIAGNOSIS — R072 Precordial pain: Secondary | ICD-10-CM

## 2010-06-06 DIAGNOSIS — R079 Chest pain, unspecified: Secondary | ICD-10-CM | POA: Insufficient documentation

## 2010-06-06 DIAGNOSIS — Z8249 Family history of ischemic heart disease and other diseases of the circulatory system: Secondary | ICD-10-CM | POA: Insufficient documentation

## 2010-06-06 DIAGNOSIS — I10 Essential (primary) hypertension: Secondary | ICD-10-CM | POA: Insufficient documentation

## 2010-06-08 ENCOUNTER — Telehealth (INDEPENDENT_AMBULATORY_CARE_PROVIDER_SITE_OTHER): Payer: Self-pay | Admitting: *Deleted

## 2010-06-08 NOTE — Assessment & Plan Note (Signed)
Summary: np6. atypical chestpain. gi workup has been negative . per le...    Referring Provider:  na Primary Provider:  Nena Jordan, MD   CC:  pt complains of sorness on back around shoulder blade...also pt complains of dizziness she thinks its realted to medication.Marland Kitchenalso sob.  History of Present Illness: 60 year old female for evaluation of chest pain. Pt has no prior cardiac history. However she does have some dyspnea on exertion relieved with rest. This is increased in severity compared to previous. There is no orthopnea or PND but there is occasional mild pedal edema. She does not have exertional chest pain although she occasionally has some pain in her shoulders. There is no palpitations or syncope. She has multiple risk factors including hypertension, hyperlipidemia and family history. Because of the above we were asked to further evaluate.  Current Medications (verified): 1)  Asacol 400 Mg  Tbec (Mesalamine) .... Take Two By Mouth Once Daily 2)  Dexilant 60 Mg Cpdr (Dexlansoprazole) .... Take One By Mouth Once Daily 3)  Lisinopril 10 Mg Tabs (Lisinopril) .Marland Kitchen.. 1 Tablet By Mouth Once Daily 4)  Amlodipine Besylate 10 Mg Tabs (Amlodipine Besylate) .Marland Kitchen.. 1 Tablet By Mouth Once Daily  Allergies: 1)  ! Sulfa  Past History:  Past Medical History: HYPERTENSION TRANSAMINASES, SERUM, ELEVATED ULCERATIVE PROCTITIS  ARTHRITIS  ESOPHAGEAL STRICTURE  HIATAL HERNIA  VITAMIN B12 DEFICIENCY  ANXIETY, CHRONIC ULCERATIVE COLITIS, LEFT SIDED G E R D  UNSPECIFIED OSTEOPOROSIS CHRONIC MAXILLARY SINUSITIS HYPERLIPIDEMIA  Past Surgical History: Reviewed history from 01/30/2008 and no changes required. c-section x 2 Nose surgery  Family History: Reviewed history from 01/29/2008 and no changes required. No FH of Colon Cancer: Son with tetralogy of fallot Family History of Diabetes: father Father with CABG in his early 79s   Social History: Reviewed history from 01/30/2008 and no changes  required. 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  Alcohol Use - occasional Illicit Drug Use - no Daily Caffeine Use  Review of Systems       Occasional pain in her lower extremities and joints but no fevers or chills, productive cough, hemoptysis, dysphasia, odynophagia, melena, hematochezia, dysuria, hematuria, rash, seizure activity, orthopnea, PND, pedal edema, claudication. Remaining systems are negative.   Vital Signs:  Patient profile:   60 year old female Height:      62 inches Weight:      159 pounds BMI:     29.19 Pulse rate:   78 / minute Resp:     14 per minute BP sitting:   160 / 80  (right arm)  Vitals Entered By: Burnett Kanaris (May 29, 2010 12:15 PM)  Physical Exam  General:  Well developed/well nourished in NAD Skin warm/dry Patient not depressed No peripheral clubbing Back-normal HEENT-normal/normal eyelids Neck supple/normal carotid upstroke bilaterally; no bruits; no JVD; no thyromegaly chest - CTA/ normal expansion CV - RRR/normal S1 and S2; no murmurs, rubs or gallops;  PMI nondisplaced Abdomen -NT/ND, no HSM, no mass, + bowel sounds, no bruit 2+ femoral pulses, no bruits Ext-no edema, chords, 2+ DP Neuro-grossly nonfocal     EKG  Procedure date:  05/29/2010  Findings:      Sinus rhythm with no ST changes.  Impression & Recommendations:  Problem # 1:  DYSPNEA (ICD-786.05) Patient complains of dyspnea on exertion and some back pain. Multiple risk factors. She also would like to begin an exercise program. Schedule stress echocardiogram. This will exclude ischemia and also quantify LV function. Her updated medication  list for this problem includes:    Lisinopril 10 Mg Tabs (Lisinopril) .Marland Kitchen... 1 tablet by mouth once daily    Amlodipine Besylate 10 Mg Tabs (Amlodipine besylate) .Marland Kitchen... 1 tablet by mouth once daily  Problem # 2:  CHEST PAIN, ATYPICAL (ICD-786.59) As per #1. Her updated medication list for  this problem includes:    Lisinopril 10 Mg Tabs (Lisinopril) .Marland Kitchen... 1 tablet by mouth once daily    Amlodipine Besylate 10 Mg Tabs (Amlodipine besylate) .Marland Kitchen... 1 tablet by mouth once daily  Orders: Stress Echo (Stress Echo)  Problem # 3:  HYPERTENSION (ICD-401.9) Her blood pressure is elevated today. I have asked her to follow this at home. She will then followup with her primary care physician and her lisinopril can be increased if needed. Her updated medication list for this problem includes:    Lisinopril 10 Mg Tabs (Lisinopril) .Marland Kitchen... 1 tablet by mouth once daily    Amlodipine Besylate 10 Mg Tabs (Amlodipine besylate) .Marland Kitchen... 1 tablet by mouth once daily  Problem # 4:  HYPERLIPIDEMIA (ICD-272.4) Management per primary care.  Problem # 5:  ULCERATIVE COLITIS, LEFT SIDED (ICD-556.5)  Patient Instructions: 1)  Your physician has requested that you have a stress echocardiogram. For further information please visit [email or web address].  Please follow instruction sheet as given.

## 2010-06-13 NOTE — Progress Notes (Signed)
Summary: stress echo appt.  Phone Note Outgoing Call Call back at Work Phone  Call back at [phone]   Call placed by: Eliezer Lofts, EMT-P,  June 05, 2010 2:19 PM Call placed to: Patient Action Taken: Phone Call Completed Summary of Call: S/w patient ref: stress echo appt. Eliezer Lofts, EMT-P  June 05, 2010 2:19 PM

## 2010-06-13 NOTE — Progress Notes (Signed)
Summary: labs from 04/07/10 are not covered under ICD code.   Phone Note Other Incoming   Caller: [place] Summary of Call: Elmo Putt called to report the Hepatitis labs are not covered under ICD code for UC. She stated the patient is being billed for the labs. Read over OV notes and the correct code shoud be 790.4 Initial call taken by: Shella Maxim RN,  June 08, 2010 1:59 PM  Follow-up for Phone Call        Called billing at [facility] and they will change the ICD code per Dr Buel Ream notes for problem #7- Elevated Transaminases. Randell Loop will add the code and reprocess. Follow-up by: Shella Maxim RN,  June 08, 2010 2:03 PM

## 2010-06-15 LAB — URINALYSIS, ROUTINE W REFLEX MICROSCOPIC
Bilirubin Urine: NEGATIVE
Ketones, ur: NEGATIVE mg/dL
Nitrite: NEGATIVE
Specific Gravity, Urine: 1.013 (ref 1.005–1.030)
Urobilinogen, UA: 0.2 mg/dL (ref 0.0–1.0)

## 2010-06-15 LAB — URINE CULTURE: Colony Count: >=100000

## 2010-06-15 LAB — URINE MICROSCOPIC-ADD ON

## 2010-07-03 ENCOUNTER — Telehealth: Payer: Self-pay | Admitting: Gastroenterology

## 2010-07-03 NOTE — Telephone Encounter (Signed)
Pt called in to report pt is still being billed for 2 tests from 04/07/10 OV. Per notes, I called [facility] on 06/08/10 and requested they add an add'l ICD code for Hep B&C labs. Called [facility] and was put on hold for 15 minutes until 5PM.

## 2010-07-04 NOTE — Telephone Encounter (Signed)
Spoke with [facility] billing. She will resubmit ICD code 790.4 to cover Hep C Antibody and Hep B Surface AG tests. Informed pt.

## 2010-08-15 NOTE — Assessment & Plan Note (Signed)
Jessica Stephens                         GASTROENTEROLOGY OFFICE NOTE   Jessica Stephens, Jessica Stephens                     MRN:          [id number]  DATE:02/25/2007                            DOB:          [birth year]    Jessica Stephens comes in for refill on her Asacol.  She is taking 800 mg twice a  day for chronic left-sided ulcerative colitis and has been in remission  for several years.  Her last colonoscopy was 5 years ago.   She currently is asymptomatic but complains of chronic sinusitis with  nasal congestion and recurrent bloody noses.  She apparently has had a  course of what sounds like Levaquin and amoxicillin per Dr. Everlene Farrier.  She  denies other cardiopulmonary complaints.  Her appetite is good, and her  weight is stable.  She is having regular bowel movements without melena  or hematochezia.  She does complain of rather marked chronic acid reflux  and is not on any therapy at this time.  She had endoscopy last  performed in September 2003, had a 5 cm hiatal hernia with a peptic  stricture of esophagus that was dilatated.   Jessica Stephens is following a regular diet.  Denies any specific food  intolerances.  She denies any other upper GI or hepatobiliary  complaints.   PAST MEDICAL HISTORY:  Remarkable for:  1. Mild essential hypertension.  She apparently is on verapamil 240 mg      a day.  2. 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   FAMILY HISTORY:  Remarkable for diabetes in her father but no known  gastrointestinal problems.   SOCIAL HISTORY:  She is married and lives with her husband and children.  She has a high school education.  She works as a Network engineer at two  different jobs.  She does not smoke or abuse ethanol.   REVIEW OF SYSTEMS:  Noncontributory.  She specifically denies skin  rashes, joint pains, or oral stomatitis.  She denies any current  cardiovascular, pulmonary, or neuropsychiatric  difficulties.   PHYSICAL EXAMINATION:  GENERAL:  She is a healthy-appearing white female  in no distress, appearing her stated age.  VITAL SIGNS:  She is 5 feet 1 inch tall and weighs 154 pounds.  Blood  pressure 142/82, pulse 82 and regular.  SKIN:  I cannot appreciate stigmata of chronic liver disease.  NECK:  No thyromegaly or lymphadenopathy.  CHEST:  Entirely clear.  CARDIAC:  She is in a regular rhythm without murmurs, gallops, or rubs.  ABDOMEN:  I cannot appreciate hepatosplenomegaly, abdominal masses, or  tenderness.  Bowel sounds are normal.  EXTREMITIES:  Unremarkable.  NEUROLOGIC:  Mental status is clear.   ASSESSMENT:  1. Chronic left-sided ulcerative colitis, on remission on moderate      doses of aminosalicylates.  2. 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  3. Chronic sinusitis.  4. Resolving diarrhea after multiple antibiotics.  5. History of osteoporosis with need for calcium replacement therapy.   RECOMMENDATIONS:  1. Switch  to Lialda 2.4 grams a day.  2. Reflux regimen with Aciphex 20 mg a day.  3. Caltrate 600 with vitamin D twice a day for osteoporosis.  4. Screening laboratory parameters.  5. Deconamine SR twice a day for congestion.  I could not appreciate      any tenderness or other symptoms of acute sinusitis on exam.  6. B12 shot 1000 mcg times 3, then start weekly B12 gel.  7. Outpatient endoscopy, colonoscopy her convenience.  8. Flu shot today at her request.     Jessica Stephens. Sharlett Iles, MD, [facility] Ore, Accident  Electronically Signed    DRP/MedQ  DD: 02/25/2007  DT: 02/25/2007  Job #: [id number]   cc:   Richardson Landry A. Everlene Farrier, M.D.

## 2010-08-18 NOTE — H&P (Signed)
Jessica Stephens, Jessica Stephens              ACCOUNT NO.:  [id number]   MEDICAL RECORD NO.:  [id number]          PATIENT TYPE:  INP   LOCATION:  5729                         FACILITY:  [facility]   PHYSICIAN:  Cletus Gash T. Pickard II, MDDATE OF BIRTH:  Sep 27, 1950   DATE OF ADMISSION:  09/16/2005  DATE OF DISCHARGE:                                HISTORY & PHYSICAL   CHIEF COMPLAINT:  Left knee pain.   HISTORY OF PRESENT ILLNESS:  The patient is a 60 year old white female with  a past medical history significant for ulcerative colitis who developed left  knee pain approximately 3 weeks ago.  She was seen at urgent medical care  and originally treated as gout.  The pain did not improve.  Serum studies  included a rheumatoid factor that was negative, and ANA that was negative,  uric acid of 3.6.  She was seen last week in urgent medical care, and the  knee was aspirated and the fluid was sent.  The patient was placed on  prednisone for presumed gout on Friday.  The culture returned methicillin-  sensitive Staphylococcus aureus after 24 hours.  The fluid had 74% PMNs.  Unable to get a cell count, however, because there was insufficient fluid  that was sent.  The crystal analysis was negative.  The patient was referred  to [facility] for presumed septic joint.  Clinically, the  knee is improving on prednisone.  She does report questionable low grade  fevers, but is otherwise healthy.   PAST MEDICAL HISTORY:  1.  Ulcerative colitis.  2.  Hypertension.   PAST SURGICAL HISTORY:  Low transverse cesarean section x2.   MEDICATIONS:  1.  Asacol 1600 mg p.o. daily.  2.  Verapamil 180 mg p.o. daily.   ALLERGIES:  No known drug allergies.   SOCIAL HISTORY:  The patient is married with 2 children.  She denies any  tobacco or drugs.  Occasional alcohol.  Works 2 jobs in Scientist, research (medical).   FAMILY HISTORY:  Dad died from a staph infection while in a nursing home  after a hip fracture and multiple  surgeries.  Mother is living with  arthritis that is rheumatoid.  She has one sister who has osteoarthritis.   PHYSICAL EXAMINATION:  VITAL SIGNS:  Vitals are noted.  GENERAL:  In no apparent distress.  Alert and oriented x3.  Non-toxic.  HEENT:  Normocephalic and atraumatic.  Pupils equal, round and reactive to  light.  Extraocular movements are intact.  TMs are canals care clear.  There  is no lymphadenopathy of the neck, no thyromegaly.  CARDIOVASCULAR:  Regular rate and rhythm.  No murmurs, rubs, or gallops.  PULMONARY:  Clear to auscultation bilaterally.  No wheezes, crackles or  rales.  ABDOMEN:  Soft, nontender, nondistended.  Positive bowel sounds.  EXTREMITIES:  No clubbing, cyanosis, or edema.  On knee exam, the patient  has a negative anterior drawer and negative posterior drawer.  There is no  laxity to varus or valgus stress.  There is mild warmth to palpation, but  there is no erythema.  The patient  has a very small, if any, perceptible  effusion.  There is a positive Apley's test, a positive McMurray's, and a  negative patella grind.   ASSESSMENT AND PLAN:  A 60 year old white female with:  1.  Knee pain.  I spoke with Dr. Onnie Graham of orthopedics.  I really      appreciated his input and opinion.  Will aspirate the knee if able.      Will send the fluid for a cell count, __________ and crystal analysis,      and then begin empiric antibiotics.  Will begin vancomycin until culture      results return.  If unable to get the fluid, will hold off on the      antibiotics, as clinically the patient does not appear to have a septic      joint.  Will also get an MRI while waiting.  I suspect a possible      meniscal tear with a sympathetic effusion.  2.  Ulcerative colitis.  Will continue her Asacol three tablets p.o. daily.  3.  Fluids, electrolytes, and nutrition.  Will begin a regular diet, KVO IV      fluid and check a BNP.  4.  Hematology.  Get a baseline sedimentation rate  and a CBC.  5.  Prophylaxis.  Ambulate in hallways.  No indication for Protonix.      Cletus Gash T. Pedro Earls, MD     WTP/MEDQ  D:  09/16/2005  T:  09/17/2005  Job:  [id number]   cc:   William A. Andria Frames, M.D.  Fax: [place]. Everlene Farrier, M.D.  Fax: [phone]

## 2010-08-18 NOTE — Discharge Summary (Signed)
[name], [name] NO.:  [id number]   MEDICAL RECORD NO.:  [id number]          PATIENT TYPE:  INP   LOCATION:  5729                         FACILITY:  [place]   PHYSICIAN:  Richardine Service, M.D.    DATE OF BIRTH:  [birth year]   DATE OF ADMISSION:  09/16/2005  DATE OF DISCHARGE:  09/19/2005                                 DISCHARGE SUMMARY   ATTENDING PHYSICIAN ON ADMISSION:  Jamal Collin. Andria Frames, M.D.   ATTENDING PHYSICIAN AT DISCHARGE:  Blane Ohara McDiarmid, M.D.   ADMISSION DIAGNOSES:  1.  Left knee pain of three weeks' duration.  2.  History of ulcerative colitis.  3.  History of hypertension.   DISCHARGE DIAGNOSES:  1.  Left Baker cyst.  2.  Ulcerative colitis.  3.  Hypertension.   HOSPITAL COURSE:  In brief, Jessica Stephens is a 60 year old lady who was  admitted to [place] for a concern of chronic left knee pain of three weeks'  duration.  The patient was sent to [place] from [facility].  She was  initially seen on September 14, 2005, at [facility] where there a concern  for septic joint.  She underwent thoracentesis and cell count was 74 polys  and 21 lymphocytes.  There were no crystals identified, however, it was only  a 1 mL specimen of blood, and was also consistent with a traumatic tap,  however, cultures did come back growing methicillin-sensitive Staphylococcus  aureus.  Prior to cultures coming back, the patient was treated as a gout  inflammation on June 15, when she was started on prednisone 40 mg, then  completing an eight-day taper.  She was also placed on colchicine 0.6 mg  t.i.d. at that time.  A repeat thoracentesis was attempted upon admission on  September 16, 2005, but was unsuccessful and only was considered a traumatic tap  with the drainage of blood.  Therefore, a uric acid was obtained which was  low at 3.6, antinuclear antibody was obtained and was negative, giving  concerns for seronegative arthritis in a patient with ulcerative colitis.  Rheumatoid factor was also negative, however, the patient did have an  elevated erythrocyte sedimentation rate of 67.  Blood cultures prior to  discharge were no growth to date and the patient was afebrile throughout her  hospital course here at 97.8, therefore, the methicillin-sensitive  Staphylococcus aureus was not treated and was considered a contaminant.  She  did undergo MRI on September 17, 2005, which showed a small joint effusion with a  large complex Baker cyst behind the left knee with intense enhancement of  the synovium of the joint capsule indicating an inflammatory versus  infectious process.  Orthopedics was consulted and the patient saw Dr.  Onnie Graham, who performed a left steroid injection with 9 mL of Xylocaine plus 1  mL of Depo-Medrol.  The patient had significant relief of her symptoms after  this injection, and was also started on ibuprofen 400 mg p.o. q.6 h. x24  hours.  She elevated her knee, as well as applied ice, compression and a  physical therapy consult  prior to discharge, who recommended the patient use  a cane with going up and down flights of stairs in order to decrease the  level of pain, and given details on ways to reduce the swelling and activity  modifications.   DISCHARGE MEDICATIONS:  Include the following:  1.  Verapamil 180 mg SR daily.  2.  Methylamine 1600 mg daily.  Continue as before.  3.  Calcium supplement per bottle instructions.  Continue as before.  4.  Multivitamin daily.  Continue as before.  5.  Lortab 2.5/500 one tablet every eight hours as needed for severe pain.      Prescription given for #12.  6.  Ibuprofen 200 mg every six hours as needed for swelling.  The patient      was instructed to purchase this over the counter and there was no      prescription needed.  Instructed to continue ice packs and elevation at      least four times a day.  She will follow up with Dr. Dellis Filbert at [facility], and was told to schedule this  appointment within three days      prior to discharge and given the number [phone].  She will follow up      with Dr. Onnie Graham of orthopedics, and given the number [phone] and to      call to schedule an appointment in two weeks.  She was instructed to      return to the emergency department or clinic for inability to bear      weight, fever, increased swelling, inability to eat or drink, or take      anything by mouth, or any other concerns.      Richardine Service, M.D.     MR/MEDQ  D:  09/19/2005  T:  09/19/2005  Job:  [id number]

## 2011-05-23 ENCOUNTER — Ambulatory Visit (INDEPENDENT_AMBULATORY_CARE_PROVIDER_SITE_OTHER): Payer: 59 | Admitting: Emergency Medicine

## 2011-05-23 VITALS — BP 137/76 | HR 83 | Temp 98.0°F | Resp 16 | Ht 61.0 in | Wt 168.8 lb

## 2011-05-23 DIAGNOSIS — N390 Urinary tract infection, site not specified: Secondary | ICD-10-CM

## 2011-05-23 DIAGNOSIS — M545 Low back pain: Secondary | ICD-10-CM

## 2011-05-23 DIAGNOSIS — R300 Dysuria: Secondary | ICD-10-CM

## 2011-05-23 LAB — POCT URINALYSIS DIPSTICK
Nitrite, UA: NEGATIVE
Protein, UA: NEGATIVE
Spec Grav, UA: <=1.005
Urobilinogen, UA: 0.2

## 2011-05-23 LAB — POCT UA - MICROSCOPIC ONLY
Casts, Ur, LPF, POC: NEGATIVE
Crystals, Ur, HPF, POC: NEGATIVE
Mucus, UA: NEGATIVE
Yeast, UA: NEGATIVE

## 2011-05-23 MED ORDER — CIPROFLOXACIN HCL 500 MG PO TABS: 250.0000 mg | ORAL_TABLET | Freq: Two times a day (BID) | ORAL | Status: AC

## 2011-05-23 NOTE — Progress Notes (Signed)
  Subjective:    Patient ID: Jessica Stephens, female    DOB: 1950-11-29, 61 y.o.   MRN: [id number]  HPI patient enters with the onset today of urinary frequency burning. She did get up through the night but that is not unusual for her. She also has had some low back pain. She denies fevers chills or other symptoms.    Review of Systems  Constitutional: Negative.   HENT: Negative.   Eyes: Negative.   Respiratory: Negative.   Cardiovascular: Negative.   Gastrointestinal: Negative.   Genitourinary: Negative.   Musculoskeletal: Negative.   Neurological: Negative.   Hematological: Negative.        Objective:   Physical Exam physical exam reveals an alert cooperative female in no acute distress. Chest clear heart regular rate no murmurs abdomen soft no tenderness. There is mild tenderness over the lower LS spine        Assessment & Plan:  Urinalysis is consistent with urinary tract infection we'll treat with 5 days of antibiotics handout given regarding increasing fluids cranberry juice et Ronney Asters.

## 2011-05-23 NOTE — Patient Instructions (Signed)
Urinary Tract Infection Infections of the urinary tract can start in several places. A bladder infection (cystitis), a kidney infection (pyelonephritis), and a prostate infection (prostatitis) are different types of urinary tract infections (UTIs). They usually get better if treated with medicines (antibiotics) that kill germs. Take all the medicine until it is gone. You or your child may feel better in a few days, but TAKE ALL MEDICINE or the infection may not respond and may become more difficult to treat. HOME CARE INSTRUCTIONS   Drink enough water and fluids to keep the urine clear or pale yellow. Cranberry juice is especially recommended, in addition to large amounts of water.   Avoid caffeine, tea, and carbonated beverages. They tend to irritate the bladder.   Alcohol may irritate the prostate.   Only take over-the-counter or prescription medicines for pain, discomfort, or fever as directed by your caregiver.  To prevent further infections:  Empty the bladder often. Avoid holding urine for long periods of time.   After a bowel movement, women should cleanse from front to back. Use each tissue only once.   Empty the bladder before and after sexual intercourse.  FINDING OUT THE RESULTS OF YOUR TEST Not all test results are available during your visit. If your or your child's test results are not back during the visit, make an appointment with your caregiver to find out the results. Do not assume everything is normal if you have not heard from your caregiver or the medical facility. It is important for you to follow up on all test results. SEEK MEDICAL CARE IF:   There is back pain.   Your baby is older than 3 months with a rectal temperature of 100.5 F (38.1 C) or higher for more than 1 day.   Your or your child's problems (symptoms) are no better in 3 days. Return sooner if you or your child is getting worse.  SEEK IMMEDIATE MEDICAL CARE IF:   There is severe back pain or lower  abdominal pain.   You or your child develops chills.   You have a fever.   Your baby is older than 3 months with a rectal temperature of 102 F (38.9 C) or higher.   Your baby is 65 months old or younger with a rectal temperature of 100.4 F (38 C) or higher.   There is nausea or vomiting.   There is continued burning or discomfort with urination.  MAKE SURE YOU:   Understand these instructions.   Will watch your condition.   Will get help right away if you are not doing well or get worse.  Document Released: 12/27/2004 Document Revised: 11/29/2010 Document Reviewed: 08/01/2006 [facility] Patient Information 2012 [place].

## 2011-05-26 ENCOUNTER — Other Ambulatory Visit: Payer: Self-pay | Admitting: Physician Assistant

## 2011-05-26 MED ORDER — FLUCONAZOLE 150 MG PO TABS: 150.0000 mg | ORAL_TABLET | Freq: Once | ORAL | Status: AC

## 2011-05-26 NOTE — Telephone Encounter (Signed)
Pt developed vaginal symptoms after abx.

## 2011-05-27 ENCOUNTER — Telehealth: Payer: Self-pay

## 2011-05-27 NOTE — Telephone Encounter (Signed)
Can Cipro cause cough, or is this secondary?

## 2011-05-27 NOTE — Telephone Encounter (Signed)
Cough is not listed as a side effect of Cipro, be sure she is taking with food.  She may have picked up something while here at the clinic, RTC if cough worsens.

## 2011-05-27 NOTE — Telephone Encounter (Signed)
Patient states she is coughing and would like to know if it is due to the antibiotic she is taking.

## 2011-05-28 NOTE — Telephone Encounter (Signed)
PT WAS VERY UPSET AND DID NOT WANT TO RTC BECAUSE SHE COULD HAVE GOTTEN SICK HERE.  ADVISED HER THAT SHE COULD HAVE GOTTEN SICK OUTSIDE OF HERE AND COULD NOT BE FOR SURE THAT SHE GOT IT FROM HERE. PER SARAH W. ADVISED PT TO GET OTC MUCINEX DM AND DELSYM SYRUP.

## 2011-05-28 NOTE — Telephone Encounter (Signed)
LM WITH GENTLEMAN TO CB

## 2011-05-30 ENCOUNTER — Ambulatory Visit (INDEPENDENT_AMBULATORY_CARE_PROVIDER_SITE_OTHER): Payer: 59 | Admitting: Emergency Medicine

## 2011-05-30 VITALS — BP 161/88 | HR 79 | Temp 99.2°F | Resp 20 | Ht 60.5 in | Wt 170.0 lb

## 2011-05-30 DIAGNOSIS — J029 Acute pharyngitis, unspecified: Secondary | ICD-10-CM

## 2011-05-30 DIAGNOSIS — J9801 Acute bronchospasm: Secondary | ICD-10-CM

## 2011-05-30 DIAGNOSIS — R05 Cough: Secondary | ICD-10-CM

## 2011-05-30 DIAGNOSIS — B9789 Other viral agents as the cause of diseases classified elsewhere: Secondary | ICD-10-CM

## 2011-05-30 DIAGNOSIS — R509 Fever, unspecified: Secondary | ICD-10-CM

## 2011-05-30 LAB — POCT CBC
Granulocyte percent: 65.2 %G (ref 37–80)
HCT, POC: 42.5 % (ref 37.7–47.9)
Lymph, poc: 1.7 (ref 0.6–3.4)
MCHC: 32 g/dL (ref 31.8–35.4)
MID (cbc): 0.6 (ref 0–0.9)
MPV: 8.2 fL (ref 0–99.8)
POC Granulocyte: 4.3 (ref 2–6.9)
POC LYMPH PERCENT: 25.5 %L (ref 10–50)
POC MID %: 9.3 %M (ref 0–12)
Platelet Count, POC: 285 10*3/uL (ref 142–424)
RDW, POC: 13.5 %

## 2011-05-30 LAB — POCT RAPID STREP A (OFFICE): Rapid Strep A Screen: NEGATIVE

## 2011-05-30 MED ORDER — BENZONATATE 200 MG PO CAPS: 200.0000 mg | ORAL_CAPSULE | Freq: Three times a day (TID) | ORAL | Status: AC | PRN

## 2011-05-30 MED ORDER — ALBUTEROL SULFATE HFA 108 (90 BASE) MCG/ACT IN AERS: 2.0000 | INHALATION_SPRAY | RESPIRATORY_TRACT | Status: DC | PRN

## 2011-05-30 MED ORDER — ALBUTEROL SULFATE (2.5 MG/3ML) 0.083% IN NEBU
2.5000 mg | INHALATION_SOLUTION | Freq: Once | RESPIRATORY_TRACT | Status: AC
  Administered 2011-05-30: 2.5 mg via RESPIRATORY_TRACT

## 2011-05-30 MED ORDER — HYDROCODONE-HOMATROPINE 5-1.5 MG/5ML PO SYRP: 5.0000 mL | ORAL_SOLUTION | Freq: Four times a day (QID) | ORAL | Status: AC | PRN

## 2011-05-30 MED ORDER — ALBUTEROL SULFATE (2.5 MG/3ML) 0.083% IN NEBU: 2.5000 mg | INHALATION_SOLUTION | Freq: Four times a day (QID) | RESPIRATORY_TRACT | Status: DC | PRN

## 2011-05-30 NOTE — Patient Instructions (Signed)
Cough, Adult  A cough is a reflex that helps clear your throat and airways. It can help heal the body or may be a reaction to an irritated airway. A cough may only last 2 or 3 weeks (acute) or may last more than 8 weeks (chronic).  CAUSES Acute cough:  Viral or bacterial infections.  Chronic cough:  Infections.   Allergies.   Asthma.   Post-nasal drip.   Smoking.   Heartburn or acid reflux.   Some medicines.   Chronic lung problems (COPD).   Cancer.  SYMPTOMS   Cough.   Fever.   Chest pain.   Increased breathing rate.   High-pitched whistling sound when breathing (wheezing).   Colored mucus that you cough up (sputum).  TREATMENT   A bacterial cough may be treated with antibiotic medicine.   A viral cough must run its course and will not respond to antibiotics.   Your caregiver may recommend other treatments if you have a chronic cough.  HOME CARE INSTRUCTIONS   Only take over-the-counter or prescription medicines for pain, discomfort, or fever as directed by your caregiver. Use cough suppressants only as directed by your caregiver.   Use a cold steam vaporizer or humidifier in your bedroom or home to help loosen secretions.   Sleep in a semi-upright position if your cough is worse at night.   Rest as needed.   Stop smoking if you smoke.  SEEK IMMEDIATE MEDICAL CARE IF:   You have pus in your sputum.   Your cough starts to worsen.   You cannot control your cough with suppressants and are losing sleep.   You begin coughing up blood.   You have difficulty breathing.   You develop pain which is getting worse or is uncontrolled with medicine.   You have a fever.  MAKE SURE YOU:   Understand these instructions.   Will watch your condition.   Will get help right away if you are not doing well or get worse.  Document Released: 09/15/2010 Document Revised: 11/29/2010 Document Reviewed: 09/15/2010 [facility] Patient Information 2012 [place].

## 2011-05-30 NOTE — Progress Notes (Signed)
  Subjective:    Patient ID: Jessica Stephens, female    DOB: Apr 22, 1950, 61 y.o.   MRN: [id number]  HPI patient enters with onset Saturday of head congestion sore throat nasal drainage fever or flushing she also has had a cough but her cough has been nonproductive. She has paroxysms of cough.    Review of Systems     Objective:   Physical Exam H. EENT exam reveals nasal congestion. The TMs are clear the throat is red and inflamed. Neck is supple there is no adenopathy. Chest exam revealed no true wheezes poor air exchange in the bases no rales were heard.        Assessment & Plan:  Assessment is flulike illness check a blood test CBC and rapid strep.

## 2011-05-31 ENCOUNTER — Telehealth: Payer: Self-pay

## 2011-05-31 NOTE — Telephone Encounter (Signed)
Spoke with pt she received the nebules for a nebulizer machine. I spoke with Jessica Stephens and we concluded that the rx was a mistake and Dr Everlene Farrier gave her a neb treatment here in the office. Pt understood and will try to take nebules back to the pharmacy

## 2011-05-31 NOTE — Telephone Encounter (Signed)
.  [facility] PT STATES SHE WAS GIVEN A PRO AIR INHALER AND HER HUSBAND PICKED UP A SOLUTION FROM THE PHARMACY, SHE DOESN'T KNOW WHAT TO DO WITH IT PLEASE CALL [phone]   [facility]

## 2011-06-03 ENCOUNTER — Ambulatory Visit (INDEPENDENT_AMBULATORY_CARE_PROVIDER_SITE_OTHER): Payer: 59 | Admitting: Family Medicine

## 2011-06-03 ENCOUNTER — Ambulatory Visit: Payer: 59

## 2011-06-03 ENCOUNTER — Telehealth: Payer: Self-pay

## 2011-06-03 ENCOUNTER — Encounter: Payer: Self-pay | Admitting: *Deleted

## 2011-06-03 VITALS — BP 156/80 | HR 103 | Temp 98.1°F | Resp 18 | Ht 60.5 in | Wt 163.4 lb

## 2011-06-03 DIAGNOSIS — R109 Unspecified abdominal pain: Secondary | ICD-10-CM

## 2011-06-03 DIAGNOSIS — R0902 Hypoxemia: Secondary | ICD-10-CM

## 2011-06-03 DIAGNOSIS — R05 Cough: Secondary | ICD-10-CM

## 2011-06-03 DIAGNOSIS — R059 Cough, unspecified: Secondary | ICD-10-CM

## 2011-06-03 DIAGNOSIS — J45909 Unspecified asthma, uncomplicated: Secondary | ICD-10-CM

## 2011-06-03 LAB — POCT CBC
Granulocyte percent: 61.2 %G (ref 37–80)
Hemoglobin: 14.3 g/dL (ref 12.2–16.2)
Lymph, poc: 2.2 (ref 0.6–3.4)
MCHC: 32.5 g/dL (ref 31.8–35.4)
MPV: 8.5 fL (ref 0–99.8)
POC Granulocyte: 4.5 (ref 2–6.9)
POC MID %: 8.5 %M (ref 0–12)

## 2011-06-03 MED ORDER — ALBUTEROL SULFATE (2.5 MG/3ML) 0.083% IN NEBU: 2.5000 mg | INHALATION_SOLUTION | Freq: Once | RESPIRATORY_TRACT | Status: DC

## 2011-06-03 MED ORDER — METHYLPREDNISOLONE SODIUM SUCC 125 MG IJ SOLR: 125.0000 mg | Freq: Once | INTRAMUSCULAR | Status: DC

## 2011-06-03 MED ORDER — IPRATROPIUM BROMIDE 0.02 % IN SOLN
0.5000 mg | Freq: Once | RESPIRATORY_TRACT | Status: AC
  Administered 2011-06-03: 0.5 mg via RESPIRATORY_TRACT

## 2011-06-03 MED ORDER — PREDNISONE 20 MG PO TABS: ORAL_TABLET | ORAL | Status: AC

## 2011-06-03 MED ORDER — ALBUTEROL SULFATE (2.5 MG/3ML) 0.083% IN NEBU
2.5000 mg | INHALATION_SOLUTION | Freq: Once | RESPIRATORY_TRACT | Status: AC
  Administered 2011-06-03: 2.5 mg via RESPIRATORY_TRACT

## 2011-06-03 MED ORDER — SODIUM CHLORIDE 0.9 % IV SOLN: 125.0000 mg | Freq: Once | INTRAVENOUS | Status: DC

## 2011-06-03 MED ORDER — METHYLPREDNISOLONE SODIUM SUCC 125 MG IJ SOLR
125.0000 mg | Freq: Once | INTRAMUSCULAR | Status: AC
  Administered 2011-06-03: 125 mg via INTRAMUSCULAR

## 2011-06-03 MED ORDER — AMOXICILLIN 875 MG PO TABS: 875.0000 mg | ORAL_TABLET | Freq: Two times a day (BID) | ORAL | Status: AC

## 2011-06-03 MED ORDER — IPRATROPIUM BROMIDE 0.02 % IN SOLN: 0.5000 mg | Freq: Once | RESPIRATORY_TRACT | Status: DC

## 2011-06-03 NOTE — Patient Instructions (Signed)
  Drink lots of fluids.    Take Amoxicillin 825 twice daily per the prescription Dr. Laney Pastor called in Use inhaler Take Prednisone as ordered   Return or ER if breathing worse

## 2011-06-03 NOTE — Progress Notes (Signed)
S:  Not better since a few days ago.  Wheezing , coughing.  Afebrile but sweated.  O: bilateral basilar rhonchi HEENT neg Heart rrr  [facility] reading (PRIMARY) by  Dr. Linna Darner Possible minimal left base congestion at heart border  .

## 2011-06-03 NOTE — Telephone Encounter (Signed)
PT IN OFFICE

## 2011-06-03 NOTE — Telephone Encounter (Signed)
AMOX [phone] bid sent to [facility] Inform her

## 2011-06-03 NOTE — Telephone Encounter (Signed)
.  [facility]  PATIENT STATES SHE SAW DR. DAUB FOR THE FLU ON Wednesday. SHE THINKS SHE IS STILL WHEEZING, BUT FEELS SOME BETTER. SHE WAS GIVEN AN INHLAER AND A BREATHING TREATMENT. NOW SHE ALSO HAS A SINUS INFECTION. SHE STATES SHE HAS TO GET BACK TO WORK. SHE DID NOT WANT TO RETURN TO THE CENTER. SHE WANTS DR. Laney Pastor TO CALL HER BACK. BEST PHONE [phone] (CELL) MBC      PHARMACY CHOICE ([facility])

## 2011-06-03 NOTE — Telephone Encounter (Signed)
PLEASE ADVISE.

## 2011-07-05 ENCOUNTER — Telehealth: Payer: Self-pay | Admitting: Gastroenterology

## 2011-07-05 NOTE — Telephone Encounter (Signed)
Advised pt to call her insurance company and find out what alt are covered cheaper for her and she can call back. I have mailed her a list of her alt drugs she will call back

## 2011-12-05 ENCOUNTER — Encounter: Payer: Self-pay | Admitting: Cardiology

## 2011-12-05 ENCOUNTER — Encounter: Payer: Self-pay | Admitting: *Deleted

## 2011-12-21 ENCOUNTER — Other Ambulatory Visit: Payer: Self-pay | Admitting: Emergency Medicine

## 2012-01-03 ENCOUNTER — Ambulatory Visit (INDEPENDENT_AMBULATORY_CARE_PROVIDER_SITE_OTHER): Payer: 59 | Admitting: Emergency Medicine

## 2012-01-03 ENCOUNTER — Ambulatory Visit: Payer: 59

## 2012-01-03 VITALS — BP 132/78 | HR 78 | Temp 98.3°F | Resp 16 | Wt 169.0 lb

## 2012-01-03 DIAGNOSIS — R05 Cough: Secondary | ICD-10-CM

## 2012-01-03 DIAGNOSIS — G8929 Other chronic pain: Secondary | ICD-10-CM

## 2012-01-03 DIAGNOSIS — I10 Essential (primary) hypertension: Secondary | ICD-10-CM

## 2012-01-03 DIAGNOSIS — IMO0001 Reserved for inherently not codable concepts without codable children: Secondary | ICD-10-CM

## 2012-01-03 DIAGNOSIS — R059 Cough, unspecified: Secondary | ICD-10-CM

## 2012-01-03 DIAGNOSIS — Z23 Encounter for immunization: Secondary | ICD-10-CM

## 2012-01-03 DIAGNOSIS — Z9109 Other allergy status, other than to drugs and biological substances: Secondary | ICD-10-CM

## 2012-01-03 DIAGNOSIS — K529 Noninfective gastroenteritis and colitis, unspecified: Secondary | ICD-10-CM

## 2012-01-03 DIAGNOSIS — R635 Abnormal weight gain: Secondary | ICD-10-CM

## 2012-01-03 DIAGNOSIS — M25539 Pain in unspecified wrist: Secondary | ICD-10-CM

## 2012-01-03 DIAGNOSIS — K5289 Other specified noninfective gastroenteritis and colitis: Secondary | ICD-10-CM

## 2012-01-03 LAB — POCT CBC
Granulocyte percent: 63.4 %G (ref 37–80)
MCH, POC: 29.7 pg (ref 27–31.2)
MCV: 91.4 fL (ref 80–97)
MID (cbc): 0.5 (ref 0–0.9)
MPV: 8.2 fL (ref 0–99.8)
POC LYMPH PERCENT: 29.3 %L (ref 10–50)
POC MID %: 7.3 %M (ref 0–12)
Platelet Count, POC: 348 10*3/uL (ref 142–424)
RBC: 4.95 M/uL (ref 4.04–5.48)
RDW, POC: 13.9 %

## 2012-01-03 LAB — COMPREHENSIVE METABOLIC PANEL
ALT: 20 U/L (ref 0–35)
AST: 17 U/L (ref 0–37)
Alkaline Phosphatase: 95 U/L (ref 39–117)
BUN: 12 mg/dL (ref 6–23)
Calcium: 9.8 mg/dL (ref 8.4–10.5)
Chloride: 106 mEq/L (ref 96–112)
Creat: 0.81 mg/dL (ref 0.50–1.10)
Potassium: 4.2 mEq/L (ref 3.5–5.3)

## 2012-01-03 LAB — LIPID PANEL: HDL: 43 mg/dL (ref >39)

## 2012-01-03 LAB — TSH: TSH: 1.942 u[IU]/mL (ref 0.350–4.500)

## 2012-01-03 MED ORDER — DEXLANSOPRAZOLE 60 MG PO CPDR: 60.0000 mg | DELAYED_RELEASE_CAPSULE | Freq: Every day | ORAL | Status: DC

## 2012-01-03 MED ORDER — AMLODIPINE BESYLATE 10 MG PO TABS: 10.0000 mg | ORAL_TABLET | Freq: Every day | ORAL | Status: DC

## 2012-01-03 MED ORDER — FLUTICASONE PROPIONATE 50 MCG/ACT NA SUSP: 2.0000 | Freq: Every day | NASAL | Status: DC

## 2012-01-03 MED ORDER — LISINOPRIL 10 MG PO TABS: 10.0000 mg | ORAL_TABLET | Freq: Every day | ORAL | Status: DC

## 2012-01-03 MED ORDER — ALBUTEROL SULFATE HFA 108 (90 BASE) MCG/ACT IN AERS: 2.0000 | INHALATION_SPRAY | RESPIRATORY_TRACT | Status: DC | PRN

## 2012-01-03 MED ORDER — MESALAMINE 400 MG PO CPDR: 400.0000 mg | DELAYED_RELEASE_CAPSULE | Freq: Two times a day (BID) | ORAL | Status: DC

## 2012-01-03 NOTE — Progress Notes (Signed)
  Subjective:    Patient ID: Jessica Stephens, female    DOB: 03-10-1951, 61 y.o.   MRN: [id number]  HPI patient here for followup of multiple medical problems.  Problem #1 is reflux. Patient currently on dexilant, her symptoms have been well-controlled she has no complaints regarding this. At the problem #2 his colitis currently treated with Asacol with good results no recent problems  Problem #3 is hypertension currently treated with amlodipine and lisinipril                                                                     Problem#4  her allergies which are currently flaring up some but responding well to Flonase  Problem #5 patient having pain in her right wrist. She does a lot of lifting at work this [place] and other heavy objects he is having pain over the ulnar styloid of her right wrist.  Review of Systems     Objective:   Physical Exam HEENT exam reveals nasal congestion. Throat is clear neck is supple chest clear to auscultation and percussion cardiac exam is unremarkable. There is tenderness over the right ulnar styloid with pain with any motion of the right wrist and pain with extension flexion and ulnar deviation against resistance  [facility] reading (PRIMARY) by  Dr Everlene Farrier no acute injury seen no fractures        Assessment & Plan:  Patient has tendinitis of her wrist. We'll place in a splint. Her medications were refilled. Her blood pressures at the present time are at goal.. Her GI symptoms of colitis and reflux are currently under

## 2012-01-03 NOTE — Patient Instructions (Addendum)
Please apply ice to the wrist. Please with the wrist splint. Please take Tylenol as needed for pain rather than advil

## 2012-02-06 ENCOUNTER — Ambulatory Visit (INDEPENDENT_AMBULATORY_CARE_PROVIDER_SITE_OTHER): Payer: 59 | Admitting: Emergency Medicine

## 2012-02-06 ENCOUNTER — Other Ambulatory Visit: Payer: Self-pay | Admitting: *Deleted

## 2012-02-06 VITALS — BP 118/80 | HR 74 | Temp 98.3°F | Resp 20 | Ht 61.0 in | Wt 158.8 lb

## 2012-02-06 DIAGNOSIS — J029 Acute pharyngitis, unspecified: Secondary | ICD-10-CM

## 2012-02-06 DIAGNOSIS — R05 Cough: Secondary | ICD-10-CM

## 2012-02-06 DIAGNOSIS — Z9109 Other allergy status, other than to drugs and biological substances: Secondary | ICD-10-CM

## 2012-02-06 LAB — POCT RAPID STREP A (OFFICE): Rapid Strep A Screen: NEGATIVE

## 2012-02-06 MED ORDER — ALBUTEROL SULFATE HFA 108 (90 BASE) MCG/ACT IN AERS: 2.0000 | INHALATION_SPRAY | RESPIRATORY_TRACT | Status: DC | PRN

## 2012-02-06 MED ORDER — PREDNISONE 10 MG PO TABS: ORAL_TABLET | ORAL | Status: DC

## 2012-02-06 NOTE — Progress Notes (Signed)
  Subjective:    Patient ID: Jessica Stephens, female    DOB: [birth year], 61 y.o.   MRN: [id number]  HPI    Review of Systems     Objective:   Physical Exam        Results for orders placed in visit on 02/06/12  POCT RAPID STREP A (OFFICE)      Component Value Range   Rapid Strep A Screen Negative  Negative   Assessment & Plan:

## 2012-04-28 ENCOUNTER — Ambulatory Visit (INDEPENDENT_AMBULATORY_CARE_PROVIDER_SITE_OTHER): Payer: BC Managed Care – PPO | Admitting: Family Medicine

## 2012-04-28 VITALS — BP 158/87 | HR 77 | Temp 98.4°F | Resp 16 | Ht 61.0 in | Wt 165.0 lb

## 2012-04-28 DIAGNOSIS — R6889 Other general symptoms and signs: Secondary | ICD-10-CM

## 2012-04-28 DIAGNOSIS — J111 Influenza due to unidentified influenza virus with other respiratory manifestations: Secondary | ICD-10-CM

## 2012-04-28 DIAGNOSIS — J101 Influenza due to other identified influenza virus with other respiratory manifestations: Secondary | ICD-10-CM

## 2012-04-28 LAB — POCT INFLUENZA A/B: Influenza B, POC: NEGATIVE

## 2012-04-28 MED ORDER — BENZONATATE 100 MG PO CAPS: ORAL_CAPSULE | ORAL | Status: DC

## 2012-04-28 MED ORDER — OSELTAMIVIR PHOSPHATE 75 MG PO CAPS: 75.0000 mg | ORAL_CAPSULE | Freq: Two times a day (BID) | ORAL | Status: DC

## 2012-04-28 NOTE — Progress Notes (Signed)
Subjective: 62 year old lady who has been sick for for 5 days. She's had congestion and coughing. She aches in her body. She had some bleeding from the right side of her nose today. She is been very congested. She has a little bit of a sore throat. She get flu shot this fall. She does not smoke.  Objective:  afebrile, pleasant and alert. Her TMs are normal. Nose has some old blood in both sides, more in the right. Throat mildly erythematous, maybe just from coughing. Chest is clear to auscultation. No wheezes rales or rhonchi were heard this time. Heart was regular without murmurs. Neck supple without significant nodes.  Assessment: Viral syndrome with upper respiratory congestion and cough and body aches, probably influenza.  Plan: Flu swab  Results for orders placed in visit on 04/28/12  POCT INFLUENZA A/B      Component Value Range   Influenza A, POC Positive     Influenza B, POC Negative     Assessment: Influenza A  Plan: Treat symptomatically. Tamiflu twice a day for 5 days. Drink plenty of fluids. Get plenty of rest.

## 2012-04-28 NOTE — Patient Instructions (Addendum)
Drink lots of fluids and get plenty of rest.  Take tamiflu twice daily  Take cough syrup at night, and cough pills in daytime if needed.  Influenza A (H1N1) H1N1 formerly called "swine flu" is a new influenza virus causing sickness in people. The H1N1 virus is different from seasonal influenza viruses. However, the H1N1 symptoms are similar to seasonal influenza and it is spread from person to person. You may be at higher risk for serious problems if you have underlying serious medical conditions. The CDC and the [facility] are following reported cases around the world. CAUSES   The flu is thought to spread mainly person-to-person through coughing or sneezing of infected people.  A person may become infected by touching something with the virus on it and then touching their mouth or nose. SYMPTOMS   Fever.  Headache.  Tiredness.  Cough.  Sore throat.  Runny or stuffy nose.  Body aches.  Diarrhea and vomiting These symptoms are referred to as "flu-like symptoms." A lot of different illnesses, including the common cold, may have similar symptoms. DIAGNOSIS   There are tests that can tell if you have the H1N1 virus.  Confirmed cases of H1N1 will be reported to the state or local health department.  A doctor's exam may be needed to tell whether you have an infection that is a complication of the flu. HOME CARE INSTRUCTIONS   Stay informed. Visit the [facility] website for current recommendations. Visit [employer]. You may also call 1-800-CDC-INFO 661-268-9832).  Get help early if you develop any of the above symptoms.  If you are at high risk from complications of the flu, talk to your caregiver as soon as you develop flu-like symptoms. Those at higher risk for complications include:  People 65 years or older.  People with chronic medical conditions.  Pregnant women.  Young children.  Your caregiver may recommend antiviral medicine to help treat  the flu.  If you get the flu, get plenty of rest, drink enough water and fluids to keep your urine clear or pale yellow, and avoid using alcohol or tobacco.  You may take over-the-counter medicine to relieve the symptoms of the flu if your caregiver approves. (Never give aspirin to children or teenagers who have flu-like symptoms, particularly fever). TREATMENT  If you do get sick, antiviral drugs are available. These drugs can make your illness milder and make you feel better faster. Treatment should start soon after illness starts. It is only effective if taken within the first day of becoming ill. Only your caregiver can prescribe antiviral medication.  PREVENTION   Cover your nose and mouth with a tissue or your arm when you cough or sneeze. Throw the tissue away.  Wash your hands often with soap and warm water, especially after you cough or sneeze. Alcohol-based cleaners are also effective against germs.  Avoid touching your eyes, nose or mouth. This is one way germs spread.  Try to avoid contact with sick people. Follow public health advice regarding school closures. Avoid crowds.  Stay home if you get sick. Limit contact with others to keep from infecting them. People infected with the H1N1 virus may be able to infect others anywhere from 1 day before feeling sick to 5-7 days after getting flu symptoms.  An H1N1 vaccine is available to help protect against the virus. In addition to the H1N1 vaccine, you will need to be vaccinated for seasonal influenza. The H1N1 and seasonal vaccines may be given on the same  day. The CDC especially recommends the H1N1 vaccine for:  Pregnant women.  People who live with or care for children younger than 60 months of age.  Health care and emergency services personnel.  Persons between the ages of 62 months through 67 years of age.  People from ages 59 through 74 years who are at higher risk for H1N1 because of chronic health disorders or immune system  problems. FACEMASKS In community and home settings, the use of facemasks and N95 respirators are not normally recommended. In certain circumstances, a facemask or N95 respirator may be used for persons at increased risk of severe illness from influenza. Your caregiver can give additional recommendations for facemask use. IN CHILDREN, EMERGENCY WARNING SIGNS THAT NEED URGENT MEDICAL CARE:  Fast breathing or trouble breathing.  Bluish skin color.  Not drinking enough fluids.  Not waking up or not interacting normally.  Being so fussy that the child does not want to be held.  Your child has an oral temperature above 102 F (38.9 C), not controlled by medicine.  Your baby is older than 3 months with a rectal temperature of 102 F (38.9 C) or higher.  Your baby is 77 months old or younger with a rectal temperature of 100.4 F (38 C) or higher.  Flu-like symptoms improve but then return with fever and worse cough. IN ADULTS, EMERGENCY WARNING SIGNS THAT NEED URGENT MEDICAL CARE:  Difficulty breathing or shortness of breath.  Pain or pressure in the chest or abdomen.  Sudden dizziness.  Confusion.  Severe or persistent vomiting.  Bluish color.  You have a oral temperature above 102 F (38.9 C), not controlled by medicine.  Flu-like symptoms improve but return with fever and worse cough. SEEK IMMEDIATE MEDICAL CARE IF:  You or someone you know is experiencing any of the above symptoms. When you arrive at the emergency center, report that you think you have the flu. You may be asked to wear a mask and/or sit in a secluded area to protect others from getting sick. MAKE SURE YOU:   Understand these instructions.  Will watch your condition.  Will get help right away if you are not doing well or get worse. Some of this information courtesy of the CDC.  Document Released: 09/05/2007 Document Revised: 06/11/2011 Document Reviewed: 09/05/2007 [facility] Patient Information 2013  [place].

## 2012-05-01 ENCOUNTER — Telehealth: Payer: Self-pay

## 2012-05-01 NOTE — Telephone Encounter (Signed)
Patient would like someone to call her regarding sinus infection.   [phone] (H)

## 2012-05-01 NOTE — Telephone Encounter (Signed)
She has the flu. Was here on 04/28/12. Spoke to patient. Her cough has decreased. She still has nasal congestion associated with the flu. I have advised her to use Mucinex bid and if this does not help she is to return to clinic for re evaluation.

## 2012-06-16 ENCOUNTER — Encounter: Payer: Self-pay | Admitting: Emergency Medicine

## 2012-06-26 ENCOUNTER — Telehealth: Payer: Self-pay | Admitting: Radiology

## 2012-06-26 ENCOUNTER — Ambulatory Visit: Payer: BC Managed Care – PPO

## 2012-06-26 ENCOUNTER — Ambulatory Visit (INDEPENDENT_AMBULATORY_CARE_PROVIDER_SITE_OTHER): Payer: BC Managed Care – PPO | Admitting: Emergency Medicine

## 2012-06-26 VITALS — BP 134/70 | HR 66 | Temp 98.0°F | Resp 16 | Ht 61.0 in | Wt 162.0 lb

## 2012-06-26 DIAGNOSIS — H9192 Unspecified hearing loss, left ear: Secondary | ICD-10-CM

## 2012-06-26 DIAGNOSIS — R51 Headache: Secondary | ICD-10-CM

## 2012-06-26 DIAGNOSIS — H9190 Unspecified hearing loss, unspecified ear: Secondary | ICD-10-CM

## 2012-06-26 DIAGNOSIS — R42 Dizziness and giddiness: Secondary | ICD-10-CM

## 2012-06-26 DIAGNOSIS — J329 Chronic sinusitis, unspecified: Secondary | ICD-10-CM

## 2012-06-26 DIAGNOSIS — H9203 Otalgia, bilateral: Secondary | ICD-10-CM

## 2012-06-26 MED ORDER — MECLIZINE HCL 25 MG PO TABS: ORAL_TABLET | ORAL | Status: DC

## 2012-06-26 NOTE — Telephone Encounter (Signed)
Patient called back about sinus infection, she is requesting antibiotic for this. Discussed with Dr Everlene Farrier and he wants her to have MRI first.

## 2012-06-26 NOTE — Patient Instructions (Addendum)
Vertigo Vertigo means you feel like you or your surroundings are moving when they are not. Vertigo can be dangerous if it occurs when you are at work, driving, or performing difficult activities.  CAUSES  Vertigo occurs when there is a conflict of signals sent to your brain from the visual and sensory systems in your body. There are many different causes of vertigo, including:  Infections, especially in the inner ear.  A bad reaction to a drug or misuse of alcohol and medicines.  Withdrawal from drugs or alcohol.  Rapidly changing positions, such as lying down or rolling over in bed.  A migraine headache.  Decreased blood flow to the brain.  Increased pressure in the brain from a head injury, infection, tumor, or bleeding. SYMPTOMS  You may feel as though the world is spinning around or you are falling to the ground. Because your balance is upset, vertigo can cause nausea and vomiting. You may have involuntary eye movements (nystagmus). DIAGNOSIS  Vertigo is usually diagnosed by physical exam. If the cause of your vertigo is unknown, your caregiver may perform imaging tests, such as an MRI scan (magnetic resonance imaging). TREATMENT  Most cases of vertigo resolve on their own, without treatment. Depending on the cause, your caregiver may prescribe certain medicines. If your vertigo is related to body position issues, your caregiver may recommend movements or procedures to correct the problem. In rare cases, if your vertigo is caused by certain inner ear problems, you may need surgery. HOME CARE INSTRUCTIONS   Follow your caregiver's instructions.  Avoid driving.  Avoid operating heavy machinery.  Avoid performing any tasks that would be dangerous to you or others during a vertigo episode.  Tell your caregiver if you notice that certain medicines seem to be causing your vertigo. Some of the medicines used to treat vertigo episodes can actually make them worse in some people. SEEK  IMMEDIATE MEDICAL CARE IF:   Your medicines do not relieve your vertigo or are making it worse.  You develop problems with talking, walking, weakness, or using your arms, hands, or legs.  You develop severe headaches.  Your nausea or vomiting continues or gets worse.  You develop visual changes.  A family member notices behavioral changes.  Your condition gets worse. MAKE SURE YOU:  Understand these instructions.  Will watch your condition.  Will get help right away if you are not doing well or get worse. Document Released: 12/27/2004 Document Revised: 06/11/2011 Document Reviewed: 10/05/2010 [facility] Patient Information 2013 [place].

## 2012-06-26 NOTE — Progress Notes (Signed)
  Subjective:    Patient ID: Jessica Stephens, female    DOB: 1950-09-01, 62 y.o.   MRN: [id number]  HPI patient and her stating for the last 6 weeks she has had dizziness when she moves her head. She feels unsteady. She has persistent nasal congestion and intermittent nasal drainage. She has not been bothered with a cough. She has had no fever associated with this. She does feel that her hearing has been diminished in her left year. She denies ringing in her ears    Review of Systems     Objective:   Physical Exam patient is alert and cooperative 1 she gazes upward or gaze is downward she has a sensation of vertigo. Her neck is otherwise supple. There are no focal neurological signs. TMs are slightly scarred but no acute infection. The nasal cavity reveals dry mucous membranes with crusting.  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 [facility] reading (PRIMARY) by  Dr. 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 She has evidence by x-ray of chronic sinus disease which appears to be worse on the right she does complain of vertiginous symptoms on movement of her eyes. Will proceed with MRI with contrast with attention to the left acoustic nerve as well as an appointment to see Dr. Elwyn Reach

## 2012-06-27 ENCOUNTER — Other Ambulatory Visit: Payer: Self-pay | Admitting: Radiology

## 2012-06-27 DIAGNOSIS — R51 Headache: Secondary | ICD-10-CM

## 2012-06-27 DIAGNOSIS — H8110 Benign paroxysmal vertigo, unspecified ear: Secondary | ICD-10-CM

## 2012-07-03 ENCOUNTER — Telehealth: Payer: Self-pay

## 2012-07-03 MED ORDER — CEFDINIR 300 MG PO CAPS: 300.0000 mg | ORAL_CAPSULE | Freq: Two times a day (BID) | ORAL | Status: DC

## 2012-07-03 NOTE — Telephone Encounter (Signed)
PT STATES HER SINUSES ARE OUT OF CONTROL AND SHE CANNOT AFFORD TO HAVE THE MRI WHICH IS OVER $1000.00, REALLY NEED TO TAKE SOMETHING ASAP PLEASE CALL PT AT [phone]   [facility] [address]

## 2012-07-03 NOTE — Telephone Encounter (Signed)
Sent this in for her. 

## 2012-07-03 NOTE — Telephone Encounter (Signed)
I called patient to talk with her regarding the situation with her sinuses. She apparently had gone to work. I spoke with her husband and told him we would try her on Omnicef 300 mg twice a day #20 no refills

## 2012-07-03 NOTE — Telephone Encounter (Signed)
Patient can not afford MRI , you wanted her to have this prior to treating sinusitis, ongoing for 6 weeks. Please advise. Amy

## 2012-07-07 ENCOUNTER — Inpatient Hospital Stay: Admission: RE | Admit: 2012-07-07 | Payer: 59 | Source: Ambulatory Visit

## 2012-07-15 ENCOUNTER — Other Ambulatory Visit: Payer: Self-pay | Admitting: Otolaryngology

## 2012-07-15 DIAGNOSIS — J309 Allergic rhinitis, unspecified: Secondary | ICD-10-CM

## 2012-07-17 ENCOUNTER — Other Ambulatory Visit: Payer: 59

## 2012-07-24 ENCOUNTER — Ambulatory Visit (HOSPITAL_COMMUNITY)
Admission: RE | Admit: 2012-07-24 | Discharge: 2012-07-24 | Disposition: A | Discharge status: Home / Self Care | Payer: BC Managed Care – PPO | Source: Ambulatory Visit | Attending: Otolaryngology | Admitting: Otolaryngology

## 2012-07-24 DIAGNOSIS — J309 Allergic rhinitis, unspecified: Secondary | ICD-10-CM

## 2012-07-24 DIAGNOSIS — H538 Other visual disturbances: Secondary | ICD-10-CM | POA: Insufficient documentation

## 2012-07-24 DIAGNOSIS — J3489 Other specified disorders of nose and nasal sinuses: Secondary | ICD-10-CM | POA: Insufficient documentation

## 2012-07-24 DIAGNOSIS — R42 Dizziness and giddiness: Secondary | ICD-10-CM | POA: Insufficient documentation

## 2012-12-05 ENCOUNTER — Ambulatory Visit (INDEPENDENT_AMBULATORY_CARE_PROVIDER_SITE_OTHER): Payer: BC Managed Care – PPO | Admitting: Emergency Medicine

## 2012-12-05 VITALS — BP 126/80 | HR 70 | Temp 98.5°F | Resp 18 | Ht 60.0 in | Wt 163.0 lb

## 2012-12-05 DIAGNOSIS — Z9109 Other allergy status, other than to drugs and biological substances: Secondary | ICD-10-CM

## 2012-12-05 DIAGNOSIS — R05 Cough: Secondary | ICD-10-CM

## 2012-12-05 LAB — POCT RAPID STREP A (OFFICE): Rapid Strep A Screen: NEGATIVE

## 2012-12-05 MED ORDER — ALBUTEROL SULFATE (2.5 MG/3ML) 0.083% IN NEBU
2.5000 mg | INHALATION_SOLUTION | Freq: Once | RESPIRATORY_TRACT | Status: AC
  Administered 2012-12-05: 2.5 mg via RESPIRATORY_TRACT

## 2012-12-05 MED ORDER — PREDNISONE 10 MG PO TABS: ORAL_TABLET | ORAL | Status: DC

## 2012-12-05 MED ORDER — ALBUTEROL SULFATE HFA 108 (90 BASE) MCG/ACT IN AERS: 2.0000 | INHALATION_SPRAY | RESPIRATORY_TRACT | Status: DC | PRN

## 2012-12-05 MED ORDER — AZELASTINE HCL 0.1 % NA SOLN: 2.0000 | Freq: Two times a day (BID) | NASAL | Status: DC

## 2012-12-05 NOTE — Progress Notes (Signed)
  Subjective:    Patient ID: Jessica Stephens, female    DOB: 09-Sep-1950, 62 y.o.   MRN: [id number]  HPI    Review of Systems     Objective:   Physical Exam  Results for orders placed in visit on 04/28/12  POCT INFLUENZA A/B      Result Value Range   Influenza A, POC Positive     Influenza B, POC Negative          Assessment & Plan:

## 2012-12-05 NOTE — Progress Notes (Signed)
  Subjective:    Patient ID: Jessica Stephens, female    DOB: 02-01-1951, 62 y.o.   MRN: [id number]  HPI patient enters with onset yesterday of a tickle in her throat associated with head congestion sore throat and a dry nonproductive cough. She has used her son's has to Pro as well as used her albuterol inhaler .   Review of Systems     Objective:   Physical Exam TMs are clear nose is congested throat is red chest exam revealed prolongation of expiration but no true wheezes  Results for orders placed in visit on 12/05/12  POCT RAPID STREP A (OFFICE)      Result Value Range   Rapid Strep A Screen Negative  Negative         Assessment & Plan:  Patient here with allergic rhinitis and reactive airways disease. We'll treat with albuterol HFA Astelin inhaler she has cough syrup to take if needed.

## 2013-01-06 ENCOUNTER — Other Ambulatory Visit: Payer: Self-pay | Admitting: Emergency Medicine

## 2013-01-06 NOTE — Telephone Encounter (Signed)
PT CALLED REGARDING HER MEDICATION, ISN'T WORKING RIGHT NOW AND DOESN'T HAVE THE MONEY TO COME IN. SHE REALLY NEED THE DELZICOL 400MGS. PLEASE CALL [phone]

## 2013-01-06 NOTE — Telephone Encounter (Signed)
Dr Everlene Farrier, do you want to give pt RFs? You've seen pt recently, but not for colitis since 01/2012.

## 2013-01-07 ENCOUNTER — Other Ambulatory Visit: Payer: Self-pay | Admitting: Emergency Medicine

## 2013-01-22 ENCOUNTER — Ambulatory Visit (INDEPENDENT_AMBULATORY_CARE_PROVIDER_SITE_OTHER): Payer: BC Managed Care – PPO | Admitting: Emergency Medicine

## 2013-01-22 VITALS — BP 132/62 | HR 81 | Temp 97.5°F | Resp 18 | Ht 60.0 in | Wt 158.0 lb

## 2013-01-22 DIAGNOSIS — Z9109 Other allergy status, other than to drugs and biological substances: Secondary | ICD-10-CM

## 2013-01-22 DIAGNOSIS — Z23 Encounter for immunization: Secondary | ICD-10-CM

## 2013-01-22 DIAGNOSIS — I10 Essential (primary) hypertension: Secondary | ICD-10-CM

## 2013-01-22 MED ORDER — AMLODIPINE BESYLATE 10 MG PO TABS: ORAL_TABLET | ORAL | Status: DC

## 2013-01-22 MED ORDER — LOSARTAN POTASSIUM 50 MG PO TABS: 50.0000 mg | ORAL_TABLET | Freq: Every day | ORAL | Status: DC

## 2013-01-22 NOTE — Progress Notes (Signed)
This chart was scribed for Arlyss Queen, MD by Elby Beck, ED Scribe. This patient was seen in room Room 8 and the patient's care was started at 8:37 AM.  Subjective:    Patient ID: Jessica Stephens, female    DOB: 1950/07/31, 62 y.o.   MRN: [id number]  HPI  HPI Comments: Jessica Stephens is a 62 y.o. Female with a history of HTN and maxillary sinusitis who presents to [facility] requesting an influenza vaccination. She also states that she would like refills of her prescribed medications today, as she is changing her insurance. She states that has sinus congestion at baseline. She states that she regularly uses a netty pot with some relief. She denies chest pain or SOB. She currently works part-time at [employer], and states that she lost her full-time job recently, and is looking for a new full-time job.   Past Medical History  Diagnosis Date  . Hypertension   . Abnormal transaminases   . Proctitis   . Arthritis   . Esophageal stricture   . Hiatal hernia   . Vitamin B12 deficiency   . Anxiety   . Ulcerative colitis     left sided  . GERD (gastroesophageal reflux disease)   . Maxillary sinusitis   . Hyperlipidemia    History   Social History  . Marital Status: Married    Spouse Name: N/A    Number of Children: N/A  . Years of Education: N/A   Occupational History  . Not on file.   Social History Main Topics  . Smoking status: Never Smoker   . Smokeless tobacco: Not on file  . Alcohol Use: No  . Drug Use: No  . Sexual Activity: Yes    Birth Control/ Protection: None   Other Topics Concern  . Not on file   Social History Narrative  . No narrative on file   Past Surgical History  Procedure Laterality Date  . Cesarean section    . Nose surg    . Nose surgery      Review of Systems  HENT: Positive for congestion.   Respiratory: Positive for cough (mild). Negative for shortness of breath.   Cardiovascular: Negative for chest pain.       Objective:   Physical Exam  is alert and cooperative. Her TMs are clear. Neck was supple chest was clear heart regular rate no murmurs.      Assessment & Plan:  I am going to change her from lisinopril to losartan to see if that helps him with cough. Her amlodipine was refilled. She is going to come in in January for a physical and we will do her blood work then to. She has been using her son's cortisone nasal spray with good relief of her respiratory symptoms  described in this documentation, which was scribed in my presence. The recorded information has been reviewed and is accurate.

## 2013-03-05 ENCOUNTER — Ambulatory Visit: Payer: Self-pay | Admitting: Physician Assistant

## 2013-03-05 VITALS — BP 150/80 | HR 80 | Temp 98.2°F | Resp 18 | Ht 61.0 in | Wt 160.0 lb

## 2013-03-05 DIAGNOSIS — R05 Cough: Secondary | ICD-10-CM

## 2013-03-05 DIAGNOSIS — J019 Acute sinusitis, unspecified: Secondary | ICD-10-CM

## 2013-03-05 MED ORDER — CEFDINIR 300 MG PO CAPS: 300.0000 mg | ORAL_CAPSULE | Freq: Two times a day (BID) | ORAL | Status: DC

## 2013-03-05 MED ORDER — HYDROCODONE-HOMATROPINE 5-1.5 MG/5ML PO SYRP: ORAL_SOLUTION | ORAL | Status: DC

## 2013-03-05 MED ORDER — METHYLPREDNISOLONE ACETATE 80 MG/ML IJ SUSP
80.0000 mg | Freq: Once | INTRAMUSCULAR | Status: AC
  Administered 2013-03-05: 80 mg via INTRAMUSCULAR

## 2013-03-05 MED ORDER — PREDNISONE 20 MG PO TABS: ORAL_TABLET | ORAL | Status: DC

## 2013-03-05 NOTE — Progress Notes (Signed)
Patient ID: Jessica Stephens MRN: [id number], DOB: 28-Oct-1950, 62 y.o. Date of Encounter: 03/05/2013, 10:44 AM  Primary Physician: Jenny Reichmann, MD  Chief Complaint:  Chief Complaint  Patient presents with  . Cough  . Sinusitis    x 2-3 days  . Chills  . Otalgia    both ear    HPI: 63 y.o. female presents with 4-5 day history of nasal congestion, post nasal drip, sore throat, sinus pressure, and a 2-3 day history of cough. Afebrile. No chills. Nasal congestion thick and green/yellow. Sinus pressure is the worst symptom. Cough is productive secondary to post nasal drip and not associated with time of day. No SOB or wheezing. Ears feel full, leading to sensation of muffled hearing. Has tried OTC cold preps without success. No GI complaints. Appetite normal. Multiple sick contacts at work. No recent antibiotics, or recent travels. Typical symptoms.    No leg trauma, sedentary periods, h/o cancer, or tobacco use.  Past Medical History  Diagnosis Date  . Hypertension   . Abnormal transaminases   . Proctitis   . Arthritis   . Esophageal stricture   . Hiatal hernia   . Vitamin B12 deficiency   . Anxiety   . Ulcerative colitis     left sided  . GERD (gastroesophageal reflux disease)   . Maxillary sinusitis   . Hyperlipidemia      Home Meds: Prior to Admission medications   Medication Sig Start Date End Date Taking? Authorizing Provider  amLODipine (NORVASC) 10 MG tablet Take one tablet daily for blood pressure 01/22/13  Yes Darlyne Russian, MD  Cetirizine HCl (ZYRTEC ALLERGY PO) Take 1 tablet by mouth daily.   Yes Historical Provider, MD  DELZICOL 400 MG CPDR DR capsule Take one capsule by mouth twice daily 01/06/13  Yes Darlyne Russian, MD  esomeprazole (NEXIUM) 20 MG capsule Take 20 mg by mouth daily before breakfast.   Yes Historical Provider, MD                losartan (COZAAR) 50 MG tablet Take 1 tablet (50 mg total) by mouth daily. 01/22/13  No Darlyne Russian, MD            Allergies:  Allergies  Allergen Reactions  . Alendronate Sodium   . Sulfonamide Derivatives     History   Social History  . Marital Status: Married    Spouse Name: N/A    Number of Children: N/A  . Years of Education: N/A   Occupational History  . Not on file.   Social History Main Topics  . Smoking status: Never Smoker   . Smokeless tobacco: Not on file  . Alcohol Use: No  . Drug Use: No  . Sexual Activity: Yes    Birth Control/ Protection: None   Other Topics Concern  . Not on file   Social History Narrative  . No narrative on file     Review of Systems: Constitutional: Positive for fatigue. Negative for chills or fever HEENT: see above Cardiovascular: negative for chest pain or palpitations Respiratory: Positive for cough. Negative for wheezing, or shortness of breath Abdominal: negative for abdominal pain, nausea, vomiting or diarrhea Dermatological: negative for rash Neurologic: Positive for headache. Negative for dizziness or vertigo   Physical Exam: Blood pressure 150/80, pulse 80, temperature 98.2 F (36.8 C), temperature source Oral, resp. rate 18, height 5' 1"  (1.549 m), weight 160 lb (72.576 kg), SpO2 99.00%., Body mass index is  30.25 kg/(m^2). General: Well developed, well nourished, in no acute distress. Head: Normocephalic, atraumatic, eyes without discharge, sclera non-icteric, nares are congested. Bilateral auditory canals clear, TM's are without perforation, pearly grey with reflective cone of light bilaterally. Bilateral maxillary sinus TTP. Oral cavity moist, dentition normal. Posterior pharynx with post nasal drip and mild erythema. No peritonsillar abscess or tonsillar exudate. Uvula midline.  Neck: Supple. No thyromegaly. Full ROM. No lymphadenopathy. Lungs: Clear bilaterally to auscultation without wheezes, rales, or rhonchi. Breathing is unlabored.  Heart: RRR with S1 S2. No murmurs, rubs, or gallops appreciated. Msk:  Strength and tone  normal for age. Extremities: No clubbing or cyanosis. No edema. Neuro: Alert and oriented X 3. Moves all extremities spontaneously. CNII-XII grossly in tact. Psych:  Responds to questions appropriately with a normal affect.     ASSESSMENT AND PLAN:  62 y.o. female with sinusitis and cough -Omnicef 300 mg 1 po bid #20 no RF  -Depo Medrol 80 mg IM -Prednisone 20 mg #18 3x3, 2x3, 1x3 no RF, patient tolerates without issue -Hycodan #4oz 1 tsp po q 4-6 hours prn cough no RF SED, patient tolerates without issue -Mucinex -Tylenol/Motrin prn -Rest/fluids -RTC precautions -RTC 3-5 days if no improvement  Signed, Christell Faith, PA-C [facility] [place], [place] [address] [phone] 03/05/2013 10:44 AM

## 2013-03-05 NOTE — Patient Instructions (Addendum)
Take your antibiotic twice a day. Your prednisone taper is 3 for 3 days, 2 for 3 days, and 1 for 3 days. Use your cough syrup as needed. Call me if you have any questions.  Sinusitis Sinusitis is redness, soreness, and swelling (inflammation) of the paranasal sinuses. Paranasal sinuses are air pockets within the bones of your face (beneath the eyes, the middle of the forehead, or above the eyes). In healthy paranasal sinuses, mucus is able to drain out, and air is able to circulate through them by way of your nose. However, when your paranasal sinuses are inflamed, mucus and air can become trapped. This can allow bacteria and other germs to grow and cause infection. Sinusitis can develop quickly and last only a short time (acute) or continue over a long period (chronic). Sinusitis that lasts for more than 12 weeks is considered chronic.  CAUSES  Causes of sinusitis include:  Allergies.  Structural abnormalities, such as displacement of the cartilage that separates your nostrils (deviated septum), which can decrease the air flow through your nose and sinuses and affect sinus drainage.  Functional abnormalities, such as when the small hairs (cilia) that line your sinuses and help remove mucus do not work properly or are not present. SYMPTOMS  Symptoms of acute and chronic sinusitis are the same. The primary symptoms are pain and pressure around the affected sinuses. Other symptoms include:  Upper toothache.  Earache.  Headache.  Bad breath.  Decreased sense of smell and taste.  A cough, which worsens when you are lying flat.  Fatigue.  Fever.  Thick drainage from your nose, which often is green and may contain pus (purulent).  Swelling and warmth over the affected sinuses. DIAGNOSIS  Your caregiver will perform a physical exam. During the exam, your caregiver may:  Look in your nose for signs of abnormal growths in your nostrils (nasal polyps).  Tap over the affected sinus to  check for signs of infection.  View the inside of your sinuses (endoscopy) with a special imaging device with a light attached (endoscope), which is inserted into your sinuses. If your caregiver suspects that you have chronic sinusitis, one or more of the following tests may be recommended:  Allergy tests.  Nasal culture A sample of mucus is taken from your nose and sent to a lab and screened for bacteria.  Nasal cytology A sample of mucus is taken from your nose and examined by your caregiver to determine if your sinusitis is related to an allergy. TREATMENT  Most cases of acute sinusitis are related to a viral infection and will resolve on their own within 10 days. Sometimes medicines are prescribed to help relieve symptoms (pain medicine, decongestants, nasal steroid sprays, or saline sprays).  However, for sinusitis related to a bacterial infection, your caregiver will prescribe antibiotic medicines. These are medicines that will help kill the bacteria causing the infection.  Rarely, sinusitis is caused by a fungal infection. In theses cases, your caregiver will prescribe antifungal medicine. For some cases of chronic sinusitis, surgery is needed. Generally, these are cases in which sinusitis recurs more than 3 times per year, despite other treatments. HOME CARE INSTRUCTIONS   Drink plenty of water. Water helps thin the mucus so your sinuses can drain more easily.  Use a humidifier.  Inhale steam 3 to 4 times a day (for example, sit in the bathroom with the shower running).  Apply a warm, moist washcloth to your face 3 to 4 times a day, or  as directed by your caregiver.  Use saline nasal sprays to help moisten and clean your sinuses.  Take over-the-counter or prescription medicines for pain, discomfort, or fever only as directed by your caregiver. SEEK IMMEDIATE MEDICAL CARE IF:  You have increasing pain or severe headaches.  You have nausea, vomiting, or drowsiness.  You have  swelling around your face.  You have vision problems.  You have a stiff neck.  You have difficulty breathing. MAKE SURE YOU:   Understand these instructions.  Will watch your condition.  Will get help right away if you are not doing well or get worse. Document Released: 03/19/2005 Document Revised: 06/11/2011 Document Reviewed: 04/03/2011 [facility] Patient Information 2014 [place], [place].

## 2013-04-08 ENCOUNTER — Ambulatory Visit (INDEPENDENT_AMBULATORY_CARE_PROVIDER_SITE_OTHER): Payer: 59 | Admitting: Emergency Medicine

## 2013-04-08 VITALS — BP 166/84 | HR 69 | Temp 97.7°F | Resp 18 | Wt 159.0 lb

## 2013-04-08 DIAGNOSIS — R300 Dysuria: Secondary | ICD-10-CM

## 2013-04-08 DIAGNOSIS — Z01419 Encounter for gynecological examination (general) (routine) without abnormal findings: Secondary | ICD-10-CM

## 2013-04-08 DIAGNOSIS — R109 Unspecified abdominal pain: Secondary | ICD-10-CM

## 2013-04-08 DIAGNOSIS — N8111 Cystocele, midline: Secondary | ICD-10-CM

## 2013-04-08 DIAGNOSIS — IMO0002 Reserved for concepts with insufficient information to code with codable children: Secondary | ICD-10-CM

## 2013-04-08 LAB — POCT URINALYSIS DIPSTICK
Nitrite, UA: NEGATIVE
PROTEIN UA: NEGATIVE
Urobilinogen, UA: 0.2
pH, UA: 6.5

## 2013-04-08 LAB — POCT UA - MICROSCOPIC ONLY
Bacteria, U Microscopic: NEGATIVE
Casts, Ur, LPF, POC: NEGATIVE
Crystals, Ur, HPF, POC: NEGATIVE
Mucus, UA: NEGATIVE
Yeast, UA: NEGATIVE

## 2013-04-08 NOTE — Patient Instructions (Signed)
You're being referred for pelvic ultrasound and referral to GYN specialist to evaluate you for your current symptoms

## 2013-04-08 NOTE — Progress Notes (Signed)
Subjective:    Patient ID: Jessica Stephens, female    DOB: [birth year], 63 y.o.   MRN: [id number] This chart was scribed for Everlene Farrier , MD by Quintin Alto, ED Scribe. This patient was seen in room 9 and the patient's care was started at 10:21 AM.  HPI HPI Comments: Jessica Stephens is a 63 y.o. female who presents to the Emergency Department w/hx of recurrent UTIs complaining of const nat, gradually worsened dysuria and urinary frequency. She also reports she is concerned for a cystocele because she is able to see and feel her bladder has dropped into her vagina.   Patient Active Problem List   Diagnosis Date Noted  . HYPERLIPIDEMIA 05/29/2010  . DYSPNEA 05/29/2010  . CHEST PAIN, ATYPICAL 05/02/2010  . ESOPHAGEAL STRICTURE 04/07/2010  . HIATAL HERNIA 04/07/2010  . ULCERATIVE PROCTITIS 04/07/2010  . ARTHRITIS 04/07/2010  . LUQ PAIN 04/07/2010  . TRANSAMINASES, SERUM, ELEVATED 04/07/2010  . VITAMIN B12 DEFICIENCY 01/30/2008  . ANXIETY, CHRONIC 01/29/2008  . HYPERTENSION 01/29/2008  . CHRONIC MAXILLARY SINUSITIS 04/24/2007  . G E R D 04/24/2007  . UNSPECIFIED OSTEOPOROSIS 04/24/2007  . ULCERATIVE COLITIS, LEFT SIDED 02/25/2007    Results for orders placed in visit on 04/08/13  POCT UA - MICROSCOPIC ONLY      Result Value Range   WBC, Ur, HPF, POC 2-4     RBC, urine, microscopic 0-2     Bacteria, U Microscopic neg     Mucus, UA neg     Epithelial cells, urine per micros 0-2     Crystals, Ur, HPF, POC neg     Casts, Ur, LPF, POC neg     Yeast, UA neg    POCT URINALYSIS DIPSTICK      Result Value Range   Color, UA yellow     Clarity, UA clear     Glucose, UA meg     Bilirubin, UA meg     Ketones, UA meg     Spec Grav, UA <=1.005     Blood, UA trace-lysed     pH, UA 6.5     Protein, UA neg     Urobilinogen, UA 0.2     Nitrite, UA neg     Leukocytes, UA small (1+)      History   Social History  . Marital Status: Married    Spouse Name: N/A    Number of Children:  N/A  . Years of Education: N/A   Occupational History  . Not on file.   Social History Main Topics  . Smoking status: Never Smoker   . Smokeless tobacco: Not on file  . Alcohol Use: No  . Drug Use: No  . Sexual Activity: Yes    Birth Control/ Protection: None   Other Topics Concern  . Not on file   Social History Narrative  . No narrative on file   Review of Systems  Constitutional: Negative for fever and chills.  Gastrointestinal: Negative for abdominal pain.  Genitourinary: Positive for dysuria and frequency. Negative for hematuria.      Objective:   Physical Exam Nursing note and vitals reviewed. Constitutional: Patient is oriented to person, place, and time. Patient appears well-developed and well-nourished. No distress.  HENT:  Head: Normocephalic and atraumatic.  Eyes: Conjunctivae are normal. Right eye exhibits no discharge. Left eye exhibits no discharge.  Neck: Normal range of motion.  Cardiovascular: Normal rate.   Pulmonary/Chest: Effort normal.  Musculoskeletal: Normal range of motion. Patient  exhibits no edema.  Neurological: Patient is alert and oriented to person, place, and time.  Skin: Skin is warm and dry.  Psychiatric: Patient has a normal mood and affect. Behavior is normal.  There is tenderness in the suprapubic area on abdominal exam. She does have a small cystocele present. She is tender in both adnexa and in the suprapubic area. There is no bleeding there is no discharge DIAGNOSTIC STUDIES: Oxygen Saturation is 99% on RA, normal by my interpretation.    COORDINATION OF CARE: 10:22 AM- Pt advised of plan for treatment and pt agrees.     Assessment & Plan:   Referral made to GYN. Referral made for a pelvic ultrasound. Pap smear was done

## 2013-04-09 ENCOUNTER — Other Ambulatory Visit: Payer: Self-pay | Admitting: Radiology

## 2013-04-09 DIAGNOSIS — R102 Pelvic and perineal pain: Secondary | ICD-10-CM

## 2013-04-09 LAB — URINE CULTURE
Colony Count: NO GROWTH
Organism ID, Bacteria: NO GROWTH

## 2013-04-09 LAB — PAP IG (IMAGE GUIDED)

## 2013-04-10 ENCOUNTER — Telehealth: Payer: Self-pay | Admitting: Emergency Medicine

## 2013-04-10 NOTE — Telephone Encounter (Signed)
Patient called back and was advised.

## 2013-04-10 NOTE — Telephone Encounter (Signed)
Please call Pap smear is normal

## 2013-04-10 NOTE — Telephone Encounter (Signed)
Called her to advise. Left message for her to call back.

## 2013-04-17 ENCOUNTER — Ambulatory Visit
Admission: RE | Admit: 2013-04-17 | Discharge: 2013-04-17 | Disposition: A | Discharge status: Home / Self Care | Payer: 59 | Source: Ambulatory Visit | Attending: Emergency Medicine | Admitting: Emergency Medicine

## 2013-04-17 DIAGNOSIS — IMO0002 Reserved for concepts with insufficient information to code with codable children: Secondary | ICD-10-CM

## 2013-04-17 DIAGNOSIS — R102 Pelvic and perineal pain: Secondary | ICD-10-CM

## 2013-04-20 ENCOUNTER — Telehealth: Payer: Self-pay

## 2013-04-20 NOTE — Telephone Encounter (Signed)
PT IS CALLING TO GET HER ULTRASOUND RESULTS AND TO FIND OUT WHY SHE IS BEING REFERRED TO A GYN PLEASE CALL PT TO ADVISE

## 2013-04-21 NOTE — Telephone Encounter (Signed)
Please advise the results of the [place].  Will advise Referral to GYN was done at her appt due to recurrent UTI and lower abd pain.

## 2013-04-21 NOTE — Telephone Encounter (Signed)
The ultrasound showed a small amount of fluid in the endometrial canal and a small fibroid. Referral should be in the works to see if the patient needs an endometrial biopsy or sampling. Please be sure referral is in for GYN.

## 2013-04-22 NOTE — Telephone Encounter (Signed)
Patient advised of her [place] results. She has an appt today at 930. She will call us with an update.

## 2013-04-28 ENCOUNTER — Telehealth: Payer: Self-pay

## 2013-04-28 NOTE — Telephone Encounter (Signed)
Per Dr. Perfecto Kingdom prior note- this medication is managed by Dr. Buel Ream office. They are addressing.

## 2013-04-28 NOTE — Telephone Encounter (Signed)
The following was copied/pasted into this encounter from an erroneous encounter entered into an incorrect patient's chart. :   Darlyne Russian, MD Physician Signed  Service date: 04/28/2013 3:22 PM  :   Please call and Lialda  1.2 g to take 2 tablets daily #60 refill for one year     Olene Floss, RN Registered Nurse Signed  Service date: 04/28/2013 8:52 AM : Dorthy Cooler 1.5g daily , 4 caps. Or Lialda 2.4 gram daily.     Sable Feil, MD Physician Signed  Service date: 04/28/2013 8:23 AM :  Dalphine Handing should be ok     Darlyne Russian, MD Physician Signed  Service date: 04/27/2013 5:33 PM :  That medication would need to be changed by Dr. Sharlett Iles to be sure she is on medication he wants her on     Belva Chimes, LPN Licensed Practical Nurse Signed  Service date: 04/27/2013 1:29 PM :  What do you suggest as an alternative to Dexcol?     Konrad Saha  Signed  Service date: 04/27/2013 9:37 AM    PT STATES SHE HAVE CROHNS DISEASE AND IS HAVING A PROBLEM GETTING HER MEDICINE SINCE HER INSURANCE HAS CHANGED AND THEY DON'T COVER THE [place] WANTED TO KNOW WHAT DR DAUB CAN DO AND SHE SEES DR PATTERSON FOR HER STOMACH PLEASE CALL [phone]

## 2013-04-28 NOTE — Telephone Encounter (Signed)
Dr Everlene Farrier, called [facility] and was advised that Lialda is to be USED WITH EXTREME CAUTION IN PT'S WITH SULFA ALLERGIES. If Delzicol is denied we will send in this information to ins also, but will have to call Dr Sharlett Iles back to advise.

## 2013-04-28 NOTE — Telephone Encounter (Signed)
Discussed w/Dr Everlene Farrier and we will wait to send in the [place] Rx until the PA decision is known in hopes that Delzicol will be covered.

## 2013-04-28 NOTE — Telephone Encounter (Signed)
Pt called to report Delzicol which she has been on for 8 years for Crohn Disease is not covered by [facility] this year. I had not received a PA req, but pt gave me Rx ins info and I completed a PA on covermymeds. Pt had tried Asacol before changed to Delzicol 40 years ago. Phone  [phone] [facility].

## 2013-05-01 ENCOUNTER — Telehealth: Payer: Self-pay | Admitting: *Deleted

## 2013-05-01 ENCOUNTER — Telehealth: Payer: Self-pay | Admitting: Gastroenterology

## 2013-05-01 NOTE — Telephone Encounter (Signed)
I called patient back and had to leave a message on her voice mail. Patient has been getting Delzicol refilled with Dr. Everlene Farrier, last RX sent [phone] with 6 refills. Patient has not had an office visit since 04-07-2010, patient will need an office visit before we can give samples. Notes from nurse at Dr. Perfecto Kingdom office is below.     Dallas Schimke, RN at 05/01/2013 11:17 AM     Status: Signed        Called to check on PA status and was told that PA is not even possible - ins just doesn't cover the Delzicol w/out trial of all three covered meds which are Apriso, Lialda and Pentasa. I explained that pt has sulfa allergies and can not use these meds (all have the same warning as Lialda). She advised that we would have to go through the Appeals process which means that the provider has to write a letter of medical necessity and explain why the covered meds can not be used and fax to [phone]. Notified pt and she stated she has started taking her Delzicol 1 tab every other day to try to make it last. We need to mark the Appeal as URGENT. Pt will check w/Dr Patterson's office to see if he has any samples.

## 2013-05-01 NOTE — Telephone Encounter (Signed)
Patient walked in to talk to me. Patient said that she has been getting her medication through Dr. Everlene Farrier and she knows that she has not been here in awhile to see Dr. Sharlett Iles. I advised patient that Dr. Sharlett Iles likes yearly follow up visits and we have not seen her in three years. Patient advised that she was aware of that. Patient started crying stating she only has three pills left, Dr. Perfecto Kingdom office does not have samples and Dr. Everlene Farrier is doing a peer to peer with insurance company to get medication approved. I advised patient unfortunately we cannot give her a prescription for medication and with samples, I can only give her one box which equals 12 capsules because she has not had an office visit in three years. Patient requested an appointment with Dr. Sharlett Iles, I advised Dr. Sharlett Iles is retiring May 15, 2013 and spots are full, plus she needs to get in ASAP to get her medication situated and have her UC checked. Patient is scheduled with Janett Billow, [place] for 05-06-2013. Patient verbally understands that for samples or a prescription she must have an office visit.

## 2013-05-01 NOTE — Telephone Encounter (Signed)
Called to check on PA status and was told that PA is not even possible - ins just doesn't cover the Delzicol w/out trial of all three covered meds which are Apriso, Lialda and Pentasa. I explained that pt has sulfa allergies and can not use these meds (all have the same warning as Lialda). She advised that we would have to go through the Appeals process which means that the provider has to write a letter of medical necessity and explain why the covered meds can not be used and fax to [phone]. Notified pt and she stated she has started taking her Delzicol 1 tab every other day to try to make it last. We need to mark the Appeal as URGENT. Pt will check w/Dr Patterson's office to see if he has any samples.

## 2013-05-03 NOTE — Telephone Encounter (Signed)
I will speak with Dr. Buel Ream office. I will asked them whether they want me to write a letter or they want me  to write a letter

## 2013-05-04 NOTE — Telephone Encounter (Signed)
Pt CB and stated that she has been on this medicine since she was 63 y.o. It was originally named something different, but manufacturer changed the name to [place]. Letter written, signed and faxed w/confirmation.

## 2013-05-04 NOTE — Telephone Encounter (Signed)
Dr Sharlett Iles has not seen pt in over 3 years. Dr Everlene Farrier will write letter, but [facility] for pt to verify that she has been on this med for 40 years.

## 2013-05-05 NOTE — Telephone Encounter (Signed)
Dr Everlene Farrier, we received denial of Appeal stating that ins plan does not cover Delzicol regardless of " any determination of medical judgement". Dr Everlene Farrier, what do you want to do?

## 2013-05-05 NOTE — Telephone Encounter (Signed)
Make her an appointment to see the gastroenterologist and he can decide what would be the appropriate plan for her

## 2013-05-06 ENCOUNTER — Ambulatory Visit (INDEPENDENT_AMBULATORY_CARE_PROVIDER_SITE_OTHER): Payer: 59 | Admitting: Gastroenterology

## 2013-05-06 ENCOUNTER — Encounter: Payer: Self-pay | Admitting: Gastroenterology

## 2013-05-06 ENCOUNTER — Other Ambulatory Visit (INDEPENDENT_AMBULATORY_CARE_PROVIDER_SITE_OTHER): Payer: 59

## 2013-05-06 VITALS — BP 122/72 | HR 66 | Ht 60.0 in | Wt 155.0 lb

## 2013-05-06 DIAGNOSIS — H8110 Benign paroxysmal vertigo, unspecified ear: Secondary | ICD-10-CM

## 2013-05-06 DIAGNOSIS — K5150 Left sided colitis without complications: Secondary | ICD-10-CM

## 2013-05-06 DIAGNOSIS — R51 Headache: Secondary | ICD-10-CM

## 2013-05-06 LAB — COMPREHENSIVE METABOLIC PANEL
ALK PHOS: 91 U/L (ref 39–117)
ALT: 31 U/L (ref 0–35)
AST: 24 U/L (ref 0–37)
Albumin: 4.1 g/dL (ref 3.5–5.2)
BILIRUBIN TOTAL: 0.8 mg/dL (ref 0.3–1.2)
BUN: 11 mg/dL (ref 6–23)
CO2: 30 mEq/L (ref 19–32)
Calcium: 9.2 mg/dL (ref 8.4–10.5)
Chloride: 103 mEq/L (ref 96–112)
Creatinine, Ser: 0.7 mg/dL (ref 0.4–1.2)
GFR: 96.94 mL/min (ref >60.00)
Glucose, Bld: 79 mg/dL (ref 70–99)
Potassium: 3.6 mEq/L (ref 3.5–5.1)
Sodium: 140 mEq/L (ref 135–145)
Total Protein: 7.2 g/dL (ref 6.0–8.3)

## 2013-05-06 LAB — CBC WITH DIFFERENTIAL/PLATELET
BASOS ABS: 0 10*3/uL (ref 0.0–0.1)
Basophils Relative: 0.6 % (ref 0.0–3.0)
Eosinophils Absolute: 0.3 10*3/uL (ref 0.0–0.7)
Eosinophils Relative: 3.8 % (ref 0.0–5.0)
HEMATOCRIT: 43.6 % (ref 36.0–46.0)
Hemoglobin: 14.8 g/dL (ref 12.0–15.0)
LYMPHS ABS: 1.6 10*3/uL (ref 0.7–4.0)
Lymphocytes Relative: 21.5 % (ref 12.0–46.0)
MCHC: 34 g/dL (ref 30.0–36.0)
MCV: 89.8 fl (ref 78.0–100.0)
MONOS PCT: 6.9 % (ref 3.0–12.0)
Monocytes Absolute: 0.5 10*3/uL (ref 0.1–1.0)
Neutro Abs: 4.9 10*3/uL (ref 1.4–7.7)
Neutrophils Relative %: 67.2 % (ref 43.0–77.0)
PLATELETS: 333 10*3/uL (ref 150.0–400.0)
RBC: 4.85 Mil/uL (ref 3.87–5.11)
RDW: 13.5 % (ref 11.5–14.6)
WBC: 7.3 10*3/uL (ref 4.5–10.5)

## 2013-05-06 NOTE — Patient Instructions (Signed)
Please go to the basement level to have your labs drawn.  We have given you samples of Delzicol.  Take as directed.

## 2013-05-06 NOTE — Telephone Encounter (Signed)
Pt is at [facility] today being seen. Called and made them aware of medication prob w/ins not covering Delzicol and her sulfa allergy and need to find a new alternative.

## 2013-05-06 NOTE — Progress Notes (Signed)
05/06/2013 Jessica Stephens [id number] Sep 05, 1950   HISTORY OF PRESENT ILLNESS:  Patient is a pleasant 63 year old female who is known to Dr. Sharlett Iles for treatment of her left-sided UC.  She has been taking Asacol/Delzicol 800 mg daily and had has been in remission on that dose for quite some time.  She comes in today for routine follow-up.  No problems.  No rectal bleeding.  Has one loose BM daily.  Her last colonoscopy was in 04/2010 at which time it was normal with no polyps or active colitis seen.  She has been undergoing colonoscopy every 5 years.   Past Medical History  Diagnosis Date  . Hypertension   . Abnormal transaminases   . Proctitis   . Arthritis   . Esophageal stricture   . Hiatal hernia   . Vitamin B12 deficiency   . Anxiety   . Ulcerative colitis     left sided  . GERD (gastroesophageal reflux disease)   . Maxillary sinusitis   . Hyperlipidemia    Past Surgical History  Procedure Laterality Date  . Cesarean section    . Nose surg    . Nose surgery      reports that she has never smoked. She has never used smokeless tobacco. She reports that she does not drink alcohol or use illicit drugs. family history includes Arthritis in her mother; Dementia in her mother. Allergies  Allergen Reactions  . Alendronate Sodium   . Sulfonamide Derivatives       Outpatient Encounter Prescriptions as of 05/06/2013  Medication Sig  . amLODipine (NORVASC) 10 MG tablet Take one tablet daily for blood pressure  . Cetirizine HCl (ZYRTEC ALLERGY PO) Take 1 tablet by mouth daily.  . DELZICOL 400 MG CPDR DR capsule Take one capsule by mouth twice daily  . esomeprazole (NEXIUM) 20 MG capsule Take 20 mg by mouth daily before breakfast.  . losartan (COZAAR) 50 MG tablet Take 1 tablet (50 mg total) by mouth daily.     REVIEW OF SYSTEMS  : All other systems reviewed and negative except where noted in the History of Present Illness.   PHYSICAL EXAM: BP 122/72  Pulse 66  Ht 5'  (1.524 m)  Wt 155 lb (70.308 kg)  BMI 30.27 kg/m2 General: Well developed white female in no acute distress Head: Normocephalic and atraumatic Eyes:  Sclerae anicteric, conjunctiva pink. Ears: Normal auditory acuity.  Lungs: Clear throughout to auscultation Heart: Regular rate and rhythm Abdomen: Soft, non-distended.  Normal bowel sounds.  Non-tender. Musculoskeletal: Symmetrical with no gross deformities  Skin: No lesions on visible extremities Extremities: No edema  Neurological: Alert oriented x 4, grossly non-focal. Psychological:  Alert and cooperative. Normal mood and affect  ASSESSMENT AND PLAN: -Left-sided UC:  In remission for several years on only 800 mg of Asacol/Delzicol daily.  Here today for routine follow-up.  Will check CBC and CMP.  Otherwise she will continue her medication at current dose.

## 2013-05-22 ENCOUNTER — Encounter: Payer: Self-pay | Admitting: Cardiology

## 2014-01-27 ENCOUNTER — Ambulatory Visit (INDEPENDENT_AMBULATORY_CARE_PROVIDER_SITE_OTHER): Payer: 59 | Admitting: Family Medicine

## 2014-01-27 VITALS — BP 140/70 | HR 64 | Temp 98.0°F | Resp 18 | Wt 160.0 lb

## 2014-01-27 DIAGNOSIS — R350 Frequency of micturition: Secondary | ICD-10-CM

## 2014-01-27 DIAGNOSIS — M545 Low back pain, unspecified: Secondary | ICD-10-CM

## 2014-01-27 DIAGNOSIS — Z9109 Other allergy status, other than to drugs and biological substances: Secondary | ICD-10-CM

## 2014-01-27 DIAGNOSIS — Z91048 Other nonmedicinal substance allergy status: Secondary | ICD-10-CM

## 2014-01-27 DIAGNOSIS — Z23 Encounter for immunization: Secondary | ICD-10-CM

## 2014-01-27 DIAGNOSIS — I10 Essential (primary) hypertension: Secondary | ICD-10-CM

## 2014-01-27 DIAGNOSIS — K5190 Ulcerative colitis, unspecified, without complications: Secondary | ICD-10-CM

## 2014-01-27 DIAGNOSIS — Z0000 Encounter for general adult medical examination without abnormal findings: Secondary | ICD-10-CM

## 2014-01-27 LAB — POCT URINALYSIS DIPSTICK
Bilirubin, UA: NEGATIVE
Glucose, UA: NEGATIVE
Ketones, UA: NEGATIVE
NITRITE UA: NEGATIVE
PH UA: 7
Protein, UA: NEGATIVE
Spec Grav, UA: 1.01
Urobilinogen, UA: 0.2

## 2014-01-27 LAB — POCT UA - MICROSCOPIC ONLY
Bacteria, U Microscopic: NEGATIVE
CASTS, UR, LPF, POC: NEGATIVE
Crystals, Ur, HPF, POC: NEGATIVE
MUCUS UA: NEGATIVE
YEAST UA: NEGATIVE

## 2014-01-27 MED ORDER — MESALAMINE 400 MG PO CPDR: DELAYED_RELEASE_CAPSULE | ORAL | Status: DC

## 2014-01-27 MED ORDER — CIPROFLOXACIN HCL 500 MG PO TABS: 500.0000 mg | ORAL_TABLET | Freq: Two times a day (BID) | ORAL | Status: DC

## 2014-01-27 MED ORDER — MESALAMINE 400 MG PO CPDR: 400.0000 mg | DELAYED_RELEASE_CAPSULE | Freq: Two times a day (BID) | ORAL | Status: DC

## 2014-01-27 MED ORDER — CETIRIZINE HCL 10 MG PO TABS: 10.0000 mg | ORAL_TABLET | Freq: Every day | ORAL | Status: DC

## 2014-01-27 MED ORDER — LOSARTAN POTASSIUM 50 MG PO TABS: 50.0000 mg | ORAL_TABLET | Freq: Every day | ORAL | Status: DC

## 2014-01-27 MED ORDER — INFLUENZA VAC SPLIT QUAD 0.5 ML IM SUSY: 0.5000 mL | PREFILLED_SYRINGE | INTRAMUSCULAR | Status: DC

## 2014-01-27 MED ORDER — PNEUMOCOCCAL 13-VAL CONJ VACC IM SUSP: 0.5000 mL | INTRAMUSCULAR | Status: DC

## 2014-01-27 MED ORDER — AMLODIPINE BESYLATE 10 MG PO TABS: ORAL_TABLET | ORAL | Status: DC

## 2014-01-27 NOTE — Patient Instructions (Signed)

## 2014-01-27 NOTE — Progress Notes (Signed)
° °  Subjective:    Patient ID: Jessica Stephens, female    DOB: [birth year], 63 y.o.   MRN: [id number] This chart was scribed for Robyn Haber, MD by Marti Sleigh, Medical Scribe. This patient was seen in Room 12 and the patient's care was started at 10:00 AM.  HPI HPI Comments: Jessica Stephens is a 63 y.o. female with a hx of HTN, ulcerative colitis, HLD, and chronic maxillary sinusitis who presents to [facility] complaining of UTI. Pt also wants a flu vaccine and medication refill. Pt states she is having mild dysuria, urgency and hesitancy. Pt endorses sleep disturbance, and increased nocturnal urination. Pt takes her ulcerative colitis medication once per day. Pt would like a pneumonia shot. Pt states she takes vitamin B12, daily.   Pt works in Scientist, research (medical).   Review of Systems  Constitutional: Negative for fever and chills.  HENT: Positive for congestion.   Respiratory: Negative for shortness of breath.   Cardiovascular: Negative for chest pain.  Gastrointestinal: Negative for nausea and vomiting.  Genitourinary: Positive for dysuria, frequency and difficulty urinating.       Objective:   Physical Exam  Nursing note and vitals reviewed. Constitutional: She is oriented to person, place, and time. She appears well-developed and well-nourished.  HENT:  Head: Normocephalic and atraumatic.  Eyes: Pupils are equal, round, and reactive to light.  Neck: No JVD present.  Cardiovascular: Normal rate and regular rhythm.   Pulmonary/Chest: Effort normal and breath sounds normal. No respiratory distress.  Neurological: She is alert and oriented to person, place, and time.  Skin: Skin is warm and dry.  Psychiatric: She has a normal mood and affect. Her behavior is normal.  no CVAT     Assessment & Plan:    Urinary frequency - Plan: POCT UA - Microscopic Only, POCT urinalysis dipstick, Urine culture, ciprofloxacin (CIPRO) 500 MG tablet  Bilateral low back pain without sciatica - Plan: POCT UA -  Microscopic Only, POCT urinalysis dipstick, Urine culture  Annual physical exam - Plan: pneumococcal 13-valent conjugate vaccine (PREVNAR 13) injection 0.5 mL, Influenza vac split quadrivalent PF (FLUARIX) injection 0.5 mL  Essential hypertension - Plan: losartan (COZAAR) 50 MG tablet, cetirizine (ZYRTEC ALLERGY) 10 MG tablet  Environmental allergies - Plan: cetirizine (ZYRTEC ALLERGY) 10 MG tablet, amLODipine (NORVASC) 10 MG tablet  Ulcerative colitis, without complications - Plan: Mesalamine (DELZICOL) 400 MG CPDR DR capsule, DISCONTINUED: Mesalamine (DELZICOL) 400 MG CPDR DR capsule  Signed, Robyn Haber, MD

## 2014-01-28 LAB — URINE CULTURE
Colony Count: NO GROWTH
Organism ID, Bacteria: NO GROWTH

## 2014-02-19 ENCOUNTER — Telehealth: Payer: Self-pay

## 2014-02-19 NOTE — Telephone Encounter (Signed)
Pt called in and states she was here about a month ago and she got a script for Cipro and she is having the same symptoms again and [facility] [address]. She can be reached @ [phone]. Thank you

## 2014-02-19 NOTE — Telephone Encounter (Signed)
Pt needs to RTC- we can not prescribe abx over the phone. She needs an OV. Pt has been advised and will be in tomorrow.

## 2014-02-20 ENCOUNTER — Ambulatory Visit (INDEPENDENT_AMBULATORY_CARE_PROVIDER_SITE_OTHER): Payer: 59 | Admitting: Emergency Medicine

## 2014-02-20 ENCOUNTER — Ambulatory Visit (INDEPENDENT_AMBULATORY_CARE_PROVIDER_SITE_OTHER): Payer: 59

## 2014-02-20 VITALS — BP 164/74 | HR 75 | Temp 97.9°F | Resp 16 | Ht 61.0 in | Wt 162.0 lb

## 2014-02-20 DIAGNOSIS — R300 Dysuria: Secondary | ICD-10-CM

## 2014-02-20 LAB — POCT URINALYSIS DIPSTICK
BILIRUBIN UA: NEGATIVE
Glucose, UA: NEGATIVE
KETONES UA: NEGATIVE
Nitrite, UA: NEGATIVE
PH UA: 6
Protein, UA: NEGATIVE
Spec Grav, UA: <=1.005
Urobilinogen, UA: 0.2

## 2014-02-20 LAB — POCT UA - MICROSCOPIC ONLY
CASTS, UR, LPF, POC: NEGATIVE
Crystals, Ur, HPF, POC: NEGATIVE
MUCUS UA: NEGATIVE
Yeast, UA: NEGATIVE

## 2014-02-20 NOTE — Patient Instructions (Signed)

## 2014-02-20 NOTE — Progress Notes (Addendum)
Subjective:    Patient ID: Jessica Stephens, female    DOB: 1951-03-16, 63 y.o.   MRN: [id number] This chart was scribed for Arlyss Queen, MD by Cathie Hoops, ED Scribe. The patient was seen in Room 11. The patient's care was started at 9:39 AM.   02/20/2014  Chief Complaint  Patient presents with  . Urinary Tract Infection    frequency, dysuria and L flank pain since yesterday   HPI HPI Comments: Jessica Stephens is a 63 y.o. female who presents to the [facility] complaining of new, moderate, gradually worsening dysuria onset one day ago. She has associated low back pain, waking up to urinate and freqency. She states she drinks several of liters of water before bed. Pt denies history of kidney stone. She notes she previously had an UTI that she thinks may have returned. She states she drinks caffeine from coffee. Pt notes she feels like she has this issue due to stress. She denies   Review of Systems  Constitutional: Negative for fever and chills.  HENT: Positive for congestion.   Gastrointestinal: Negative for nausea and vomiting.  Genitourinary: Positive for dysuria and frequency.  Musculoskeletal: Positive for back pain.   Past Medical History  Diagnosis Date  . Hypertension   . Abnormal transaminases   . Proctitis   . Arthritis   . Esophageal stricture   . Hiatal hernia   . Vitamin B12 deficiency   . Anxiety   . Ulcerative colitis     left sided  . GERD (gastroesophageal reflux disease)   . Maxillary sinusitis   . Hyperlipidemia    Past Surgical History  Procedure Laterality Date  . Cesarean section    . Nose surg    . Nose surgery     Allergies  Allergen Reactions  . Alendronate Sodium   . Sulfonamide Derivatives    Current Outpatient Prescriptions  Medication Sig Dispense Refill  . amLODipine (NORVASC) 10 MG tablet Take one tablet daily for blood pressure 30 tablet 11  . cetirizine (ZYRTEC ALLERGY) 10 MG tablet Take 1 tablet (10 mg  total) by mouth daily. 90 tablet 3  . ciprofloxacin (CIPRO) 500 MG tablet Take 1 tablet (500 mg total) by mouth 2 (two) times daily. 10 tablet 0  . esomeprazole (NEXIUM) 20 MG capsule Take 20 mg by mouth daily before breakfast.    . losartan (COZAAR) 50 MG tablet Take 1 tablet (50 mg total) by mouth daily. 90 tablet 3  . Mesalamine (DELZICOL) 400 MG CPDR DR capsule Take 1 capsule (400 mg total) by mouth 2 (two) times daily. One capsule daily 180 capsule 3   No current facility-administered medications for this visit.       Objective:  Triage Vitals: BP 164/74 mmHg  Pulse 75  Temp(Src) 97.9 F (36.6 C)  Resp 16  Ht 5' 1"  (1.549 m)  Wt 162 lb (73.483 kg)  BMI 30.63 kg/m2  SpO2 97%  9:44 AM: Manual BP Reading Right Arm while Sitting 144/70   Physical Exam  Constitutional: She is oriented to person, place, and time. She appears well-developed and well-nourished. No distress.  HENT:  Head: Normocephalic and atraumatic.  Significant nasal congestion.  Eyes: Conjunctivae and EOM are normal.  Neck: Neck supple. No tracheal deviation present.  Cardiovascular: Normal rate.   Pulmonary/Chest: Effort normal. No respiratory distress.  Abdominal: Soft.  Musculoskeletal: Normal range of motion.  Tenderness of the L5-S1 on the lower left.  Neurological:  She is alert and oriented to person, place, and time.  Skin: Skin is warm and dry.  Psychiatric: She has a normal mood and affect. Her behavior is normal.  Nursing note and vitals reviewed.  Results for orders placed or performed in visit on 02/20/14  POCT urinalysis dipstick  Result Value Ref Range   Color, UA yellow    Clarity, UA clear    Glucose, UA neg    Bilirubin, UA neg    Ketones, UA neg    Spec Grav, UA <=1.005    Blood, UA trace    pH, UA 6.0    Protein, UA neg    Urobilinogen, UA 0.2    Nitrite, UA neg    Leukocytes, UA small (1+)   POCT UA - Microscopic Only  Result Value Ref Range   WBC, Ur, HPF, POC 2-4     RBC, urine, microscopic 1-3    Bacteria, U Microscopic trace    Mucus, UA neg    Epithelial cells, urine per micros 1-3    Crystals, Ur, HPF, POC neg    Casts, Ur, LPF, POC neg    Yeast, UA neg    [facility] reading (PRIMARY) by  Dr. Everlene Farrier patient has constipation and arthritic changes of the lumbar spine otherwise unremarkable.  Assessment & Plan:  9:47 AM- Patient informed of current plan for treatment and evaluation and agrees with plan at this time. We'll treat with decreased caffeine intake I advised her to get Azo-Standard. If her culture is negative would advise- referral for evaluation.I personally performed the services described in this documentation, which was scribed in my presence. The recorded information has been reviewed and is accurate.

## 2014-02-23 ENCOUNTER — Telehealth: Payer: Self-pay

## 2014-02-23 LAB — URINE CULTURE: Colony Count: >=100000

## 2014-02-23 MED ORDER — AMOXICILLIN 500 MG PO CAPS: 500.0000 mg | ORAL_CAPSULE | Freq: Two times a day (BID) | ORAL | Status: DC

## 2014-02-23 MED ORDER — AMOXICILLIN 500 MG PO CAPS: 500.0000 mg | ORAL_CAPSULE | Freq: Three times a day (TID) | ORAL | Status: DC

## 2014-02-23 MED ORDER — FLUCONAZOLE 150 MG PO TABS: 150.0000 mg | ORAL_TABLET | Freq: Once | ORAL | Status: DC

## 2014-02-23 NOTE — Telephone Encounter (Signed)
Patient called and call dropped while patient was talking.

## 2014-02-23 NOTE — Telephone Encounter (Signed)
Notes from LAB RESULTS:  Notes Recorded by Darlyne Russian, MD on 02/23/2014 at 11:00 AM B sure her prescription has been called in for amoxicillin. She should be on 500 twice a day for 10 days. Notes Recorded by Darlyne Russian, MD on 02/23/2014 at 7:53 AM Call patient she does have an infection with enterococcus. This bacteria is not sensitive to Cipro. Would advise amoxicillin 500 one twice a day for 10 days. Repeat urine in 2 weeks. Please call in prescription. Please be sure she can take penicillin.  LM for pt advising medication sent to the pharmacy- including diflucan. Pt needs to call back to make sure she understands to d/c Cipro and start Amox. Per lab notes she needs to RTC in 2 weeks for a recheck.

## 2014-02-23 NOTE — Telephone Encounter (Signed)
[facility] [address]    Pill form medication for yeast infection    [phone]

## 2014-02-24 NOTE — Telephone Encounter (Signed)
Spoke to pt- she understands medication.

## 2014-03-07 ENCOUNTER — Ambulatory Visit (INDEPENDENT_AMBULATORY_CARE_PROVIDER_SITE_OTHER): Payer: 59 | Admitting: Emergency Medicine

## 2014-03-07 VITALS — BP 152/80 | HR 75 | Temp 97.8°F | Resp 20 | Ht 61.0 in | Wt 161.2 lb

## 2014-03-07 DIAGNOSIS — R059 Cough, unspecified: Secondary | ICD-10-CM

## 2014-03-07 DIAGNOSIS — R509 Fever, unspecified: Secondary | ICD-10-CM

## 2014-03-07 DIAGNOSIS — R69 Illness, unspecified: Principal | ICD-10-CM

## 2014-03-07 DIAGNOSIS — J111 Influenza due to unidentified influenza virus with other respiratory manifestations: Secondary | ICD-10-CM

## 2014-03-07 DIAGNOSIS — R05 Cough: Secondary | ICD-10-CM

## 2014-03-07 LAB — POCT INFLUENZA A/B
INFLUENZA B, POC: NEGATIVE
Influenza A, POC: NEGATIVE

## 2014-03-07 MED ORDER — PROMETHAZINE-CODEINE 6.25-10 MG/5ML PO SYRP: 5.0000 mL | ORAL_SOLUTION | ORAL | Status: DC | PRN

## 2014-03-07 MED ORDER — PSEUDOEPHEDRINE-GUAIFENESIN ER 60-600 MG PO TB12: 1.0000 | ORAL_TABLET | Freq: Two times a day (BID) | ORAL | Status: DC

## 2014-03-07 MED ORDER — OSELTAMIVIR PHOSPHATE 75 MG PO CAPS: 75.0000 mg | ORAL_CAPSULE | Freq: Two times a day (BID) | ORAL | Status: DC

## 2014-03-07 NOTE — Progress Notes (Signed)
[facility] [address] Jessica Stephens [id number] [phone]  Date:  03/07/2014   Name:  Jessica Stephens   DOB:  May 17, 1950   MRN:  [id number]  PCP:  Jenny Reichmann, MD    Chief Complaint: Cough; Facial Pain; Nasal Congestion; Fever; and Ear Fullness   History of Present Illness:  Jessica Stephens is a 63 y.o. very pleasant female patient who presents with the following:  Ill suddenly on Friday night.  Has headache, nasal congestion and a watery drainage.  No post nasal drip. Cough that is not productive and no wheezing or shortness of breath. No nausea or vomiting.  No stool change or wheezing. Took a couple doses of amoxicillin with no improvement Fever to 102. No improvement with over the counter medications or other home remedies.  Denies other complaint or health concern today.   Patient Active Problem List   Diagnosis Date Noted  . HYPERLIPIDEMIA 05/29/2010  . DYSPNEA 05/29/2010  . CHEST PAIN, ATYPICAL 05/02/2010  . ESOPHAGEAL STRICTURE 04/07/2010  . HIATAL HERNIA 04/07/2010  . ULCERATIVE PROCTITIS 04/07/2010  . ARTHRITIS 04/07/2010  . LUQ PAIN 04/07/2010  . TRANSAMINASES, SERUM, ELEVATED 04/07/2010  . VITAMIN B12 DEFICIENCY 01/30/2008  . ANXIETY, CHRONIC 01/29/2008  . HYPERTENSION 01/29/2008  . CHRONIC MAXILLARY SINUSITIS 04/24/2007  . G E R D 04/24/2007  . UNSPECIFIED OSTEOPOROSIS 04/24/2007  . ULCERATIVE COLITIS, LEFT SIDED 02/25/2007    Past Medical History  Diagnosis Date  . Hypertension   . Abnormal transaminases   . Proctitis   . Arthritis   . Esophageal stricture   . Hiatal hernia   . Vitamin B12 deficiency   . Anxiety   . Ulcerative colitis     left sided  . GERD (gastroesophageal reflux disease)   . Maxillary sinusitis   . Hyperlipidemia     Past Surgical History  Procedure Laterality Date  . Cesarean section    . Nose surg    . Nose surgery      History  Substance Use Topics  . Smoking status: Never Smoker    . Smokeless tobacco: Never Used  . Alcohol Use: No    Family History  Problem Relation Age of Onset  . Arthritis Mother   . Dementia Mother     Allergies  Allergen Reactions  . Alendronate Sodium   . Sulfonamide Derivatives     Medication list has been reviewed and updated.  Current Outpatient Prescriptions on File Prior to Visit  Medication Sig Dispense Refill  . amLODipine (NORVASC) 10 MG tablet Take one tablet daily for blood pressure 30 tablet 11  . cetirizine (ZYRTEC ALLERGY) 10 MG tablet Take 1 tablet (10 mg total) by mouth daily. 90 tablet 3  . esomeprazole (NEXIUM) 20 MG capsule Take 20 mg by mouth daily before breakfast.    . losartan (COZAAR) 50 MG tablet Take 1 tablet (50 mg total) by mouth daily. 90 tablet 3  . Mesalamine (DELZICOL) 400 MG CPDR DR capsule Take 1 capsule (400 mg total) by mouth 2 (two) times daily. One capsule daily 180 capsule 3   No current facility-administered medications on file prior to visit.    Review of Systems:  As per HPI, otherwise negative.    Physical Examination: Filed Vitals:   03/07/14 0848  BP: 152/80  Pulse: 75  Temp: 97.8 F (36.6 C)  Resp: 20   Filed Vitals:   03/07/14 0848  Height: 5' 1"  (1.549 m)  Weight: 161 lb 3.2 oz (73.12 kg)   Body mass index is 30.47 kg/(m^2). Ideal Body Weight: Weight in (lb) to have BMI = 25: 132  GEN: WDWN, NAD, Non-toxic, A & O x 3 HEENT: Atraumatic, Normocephalic. Neck supple. No masses, No LAD. Ears and Nose: No external deformity. CV: RRR, No M/G/R. No JVD. No thrill. No extra heart sounds. PULM: CTA B, no wheezes, crackles, rhonchi. No retractions. No resp. distress. No accessory muscle use. ABD: S, NT, ND, +BS. No rebound. No HSM. EXTR: No c/c/e NEURO Normal gait.  PSYCH: Normally interactive. Conversant. Not depressed or anxious appearing.  Calm demeanor.    Assessment and Plan: ILI  tamiflu Phen c cod mucinex d  Signed,  Ellison Carwin, MD   Results for  orders placed or performed in visit on 03/07/14  POCT Influenza A/B  Result Value Ref Range   Influenza A, POC Negative    Influenza B, POC Negative

## 2014-03-07 NOTE — Patient Instructions (Signed)

## 2014-03-07 NOTE — Addendum Note (Signed)
Addended by: Roselee Culver on: 03/07/2014 10:16 AM   Modules accepted: Orders

## 2014-03-09 ENCOUNTER — Telehealth: Payer: Self-pay

## 2014-03-09 NOTE — Telephone Encounter (Signed)
Patient says Dr Ouida Sills diagnosed her with the flu, she says her sinuses are now clogged and she cant hear very well. She is requesting an rx for prednisone.

## 2014-03-10 NOTE — Telephone Encounter (Signed)
Prednisone is not a choice I would make.  Tell her to try some mucinex d

## 2014-03-10 NOTE — Telephone Encounter (Signed)
Patient calling back and requesting Prednisone to help with her sinus. Per patient she still has 2 days left of her [place] flu. [facility] [address]. 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 Patients call back number is [phone]

## 2014-03-11 NOTE — Telephone Encounter (Signed)
Lm for pt to RTC

## 2014-03-11 NOTE — Telephone Encounter (Signed)
Pt has been taking Musinex and it is not helping. Pt wants to have the prednisone called in.

## 2014-03-11 NOTE — Telephone Encounter (Signed)
Patient upset that it has been two days of no call back from anyone in regard to getting prednisone prescribed from Dr. Everlene Farrier. She only wants Dr. Everlene Farrier to get this message. She says mucinex d was given along side tamaflu and did not work for patient. She says Dr. Everlene Farrier is familiar with patients history. 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 She says she not trying to be a pest calling constantly she just wants to get better.   [phone]

## 2014-03-11 NOTE — Telephone Encounter (Signed)
I don't think prednisone is appropriate  She can come back in

## 2014-06-02 ENCOUNTER — Other Ambulatory Visit: Payer: Self-pay

## 2014-06-02 DIAGNOSIS — Z9109 Other allergy status, other than to drugs and biological substances: Secondary | ICD-10-CM

## 2014-06-02 MED ORDER — AMLODIPINE BESYLATE 10 MG PO TABS: ORAL_TABLET | ORAL | Status: DC

## 2014-08-19 ENCOUNTER — Emergency Department (HOSPITAL_COMMUNITY): Payer: 59

## 2014-08-19 ENCOUNTER — Inpatient Hospital Stay (HOSPITAL_COMMUNITY): Payer: 59

## 2014-08-19 ENCOUNTER — Encounter (HOSPITAL_COMMUNITY): Payer: Self-pay | Admitting: Emergency Medicine

## 2014-08-19 ENCOUNTER — Inpatient Hospital Stay (HOSPITAL_COMMUNITY)
Admission: EM | Admit: 2014-08-19 | Discharge: 2014-08-21 | DRG: 871 | Disposition: A | Discharge status: Home / Self Care | Payer: 59 | Attending: Internal Medicine | Admitting: Internal Medicine

## 2014-08-19 DIAGNOSIS — I4581 Long QT syndrome: Secondary | ICD-10-CM

## 2014-08-19 DIAGNOSIS — J181 Lobar pneumonia, unspecified organism: Secondary | ICD-10-CM | POA: Diagnosis not present

## 2014-08-19 DIAGNOSIS — R509 Fever, unspecified: Secondary | ICD-10-CM | POA: Diagnosis not present

## 2014-08-19 DIAGNOSIS — Z79899 Other long term (current) drug therapy: Secondary | ICD-10-CM

## 2014-08-19 DIAGNOSIS — E876 Hypokalemia: Secondary | ICD-10-CM

## 2014-08-19 DIAGNOSIS — K219 Gastro-esophageal reflux disease without esophagitis: Secondary | ICD-10-CM | POA: Diagnosis present

## 2014-08-19 DIAGNOSIS — K5190 Ulcerative colitis, unspecified, without complications: Secondary | ICD-10-CM | POA: Diagnosis present

## 2014-08-19 DIAGNOSIS — J189 Pneumonia, unspecified organism: Secondary | ICD-10-CM

## 2014-08-19 DIAGNOSIS — I10 Essential (primary) hypertension: Secondary | ICD-10-CM | POA: Diagnosis present

## 2014-08-19 DIAGNOSIS — R9431 Abnormal electrocardiogram [ECG] [EKG]: Secondary | ICD-10-CM | POA: Diagnosis present

## 2014-08-19 DIAGNOSIS — A419 Sepsis, unspecified organism: Principal | ICD-10-CM

## 2014-08-19 DIAGNOSIS — E785 Hyperlipidemia, unspecified: Secondary | ICD-10-CM | POA: Diagnosis present

## 2014-08-19 DIAGNOSIS — R109 Unspecified abdominal pain: Secondary | ICD-10-CM

## 2014-08-19 LAB — CBC WITH DIFFERENTIAL/PLATELET
BASOS ABS: 0 10*3/uL (ref 0.0–0.1)
BASOS PCT: 0 % (ref 0–1)
BASOS PCT: 0 % (ref 0–1)
Basophils Absolute: 0 10*3/uL (ref 0.0–0.1)
EOS PCT: 0 % (ref 0–5)
Eosinophils Absolute: 0.1 10*3/uL (ref 0.0–0.7)
Eosinophils Absolute: 0 10*3/uL (ref 0.0–0.7)
Eosinophils Relative: 1 % (ref 0–5)
HCT: 35.8 % — ABNORMAL LOW (ref 36.0–46.0)
HEMATOCRIT: 44.1 % (ref 36.0–46.0)
HEMOGLOBIN: 15.1 g/dL — AB (ref 12.0–15.0)
Hemoglobin: 12 g/dL (ref 12.0–15.0)
LYMPHS PCT: 4 % — AB (ref 12–46)
Lymphocytes Relative: 4 % — ABNORMAL LOW (ref 12–46)
Lymphs Abs: 0.6 10*3/uL — ABNORMAL LOW (ref 0.7–4.0)
Lymphs Abs: 1 10*3/uL (ref 0.7–4.0)
MCH: 30.3 pg (ref 26.0–34.0)
MCH: 29.7 pg (ref 26.0–34.0)
MCHC: 34.2 g/dL (ref 30.0–36.0)
MCHC: 33.5 g/dL (ref 30.0–36.0)
MCV: 88.4 fL (ref 78.0–100.0)
MCV: 88.6 fL (ref 78.0–100.0)
Monocytes Absolute: 0.7 10*3/uL (ref 0.1–1.0)
Monocytes Absolute: 1.8 10*3/uL — ABNORMAL HIGH (ref 0.1–1.0)
Monocytes Relative: 4 % (ref 3–12)
Monocytes Relative: 8 % (ref 3–12)
NEUTROS ABS: 13.8 10*3/uL — AB (ref 1.7–7.7)
NEUTROS PCT: 91 % — AB (ref 43–77)
NEUTROS PCT: 88 % — AB (ref 43–77)
Neutro Abs: 20.8 10*3/uL — ABNORMAL HIGH (ref 1.7–7.7)
PLATELETS: 227 10*3/uL (ref 150–400)
Platelets: 266 10*3/uL (ref 150–400)
RBC: 4.99 MIL/uL (ref 3.87–5.11)
RBC: 4.04 MIL/uL (ref 3.87–5.11)
RDW: 13.1 % (ref 11.5–15.5)
RDW: 13.3 % (ref 11.5–15.5)
WBC: 15.2 10*3/uL — ABNORMAL HIGH (ref 4.0–10.5)
WBC: 23.6 10*3/uL — ABNORMAL HIGH (ref 4.0–10.5)

## 2014-08-19 LAB — COMPREHENSIVE METABOLIC PANEL
ALK PHOS: 142 U/L — AB (ref 38–126)
ALT: 68 U/L — ABNORMAL HIGH (ref 14–54)
ALT: 51 U/L (ref 14–54)
ANION GAP: 12 (ref 5–15)
AST: 33 U/L (ref 15–41)
AST: 25 U/L (ref 15–41)
Albumin: 4.3 g/dL (ref 3.5–5.0)
Albumin: 3.6 g/dL (ref 3.5–5.0)
Alkaline Phosphatase: 114 U/L (ref 38–126)
Anion gap: 7 (ref 5–15)
BILIRUBIN TOTAL: 1.1 mg/dL (ref 0.3–1.2)
BILIRUBIN TOTAL: 0.9 mg/dL (ref 0.3–1.2)
BUN: 14 mg/dL (ref 6–20)
BUN: 11 mg/dL (ref 6–20)
CALCIUM: 8.1 mg/dL — AB (ref 8.9–10.3)
CHLORIDE: 105 mmol/L (ref 101–111)
CHLORIDE: 107 mmol/L (ref 101–111)
CO2: 24 mmol/L (ref 22–32)
CO2: 21 mmol/L — AB (ref 22–32)
CREATININE: 0.67 mg/dL (ref 0.44–1.00)
Calcium: 8.9 mg/dL (ref 8.9–10.3)
Creatinine, Ser: 0.61 mg/dL (ref 0.44–1.00)
GFR calc Af Amer: >60 mL/min (ref >60)
GLUCOSE: 125 mg/dL — AB (ref 65–99)
Glucose, Bld: 109 mg/dL — ABNORMAL HIGH (ref 65–99)
POTASSIUM: 3.4 mmol/L — AB (ref 3.5–5.1)
Potassium: 3.1 mmol/L — ABNORMAL LOW (ref 3.5–5.1)
SODIUM: 136 mmol/L (ref 135–145)
Sodium: 140 mmol/L (ref 135–145)
TOTAL PROTEIN: 7.7 g/dL (ref 6.5–8.1)
Total Protein: 6.4 g/dL — ABNORMAL LOW (ref 6.5–8.1)

## 2014-08-19 LAB — MAGNESIUM: MAGNESIUM: 1.6 mg/dL — AB (ref 1.7–2.4)

## 2014-08-19 LAB — LACTIC ACID, PLASMA: Lactic Acid, Venous: 1.1 mmol/L (ref 0.5–2.0)

## 2014-08-19 LAB — URINALYSIS, ROUTINE W REFLEX MICROSCOPIC
Bilirubin Urine: NEGATIVE
GLUCOSE, UA: NEGATIVE mg/dL
KETONES UR: NEGATIVE mg/dL
Nitrite: NEGATIVE
PROTEIN: NEGATIVE mg/dL
Specific Gravity, Urine: 1.015 (ref 1.005–1.030)
UROBILINOGEN UA: 0.2 mg/dL (ref 0.0–1.0)
pH: 5 (ref 5.0–8.0)

## 2014-08-19 LAB — I-STAT CG4 LACTIC ACID, ED: Lactic Acid, Venous: 2.02 mmol/L (ref 0.5–2.0)

## 2014-08-19 LAB — APTT: APTT: 26 s (ref 24–37)

## 2014-08-19 LAB — URINE MICROSCOPIC-ADD ON

## 2014-08-19 LAB — PROTIME-INR
INR: 1.15 (ref 0.00–1.49)
Prothrombin Time: 14.9 seconds (ref 11.6–15.2)

## 2014-08-19 LAB — PROCALCITONIN: PROCALCITONIN: 4.3 ng/mL

## 2014-08-19 MED ORDER — PANTOPRAZOLE SODIUM 40 MG PO TBEC
40.0000 mg | DELAYED_RELEASE_TABLET | Freq: Every day | ORAL | Status: DC
  Administered 2014-08-20 – 2014-08-21 (×2): 40 mg via ORAL
  Filled 2014-08-19 (×2): qty 1

## 2014-08-19 MED ORDER — PROMETHAZINE HCL 25 MG PO TABS: 12.5000 mg | ORAL_TABLET | Freq: Four times a day (QID) | ORAL | Status: DC | PRN

## 2014-08-19 MED ORDER — LOSARTAN POTASSIUM 50 MG PO TABS
50.0000 mg | ORAL_TABLET | Freq: Every day | ORAL | Status: DC
  Administered 2014-08-20: 50 mg via ORAL
  Filled 2014-08-19: qty 1

## 2014-08-19 MED ORDER — MESALAMINE 400 MG PO CPDR
400.0000 mg | DELAYED_RELEASE_CAPSULE | Freq: Two times a day (BID) | ORAL | Status: DC
  Administered 2014-08-19 – 2014-08-21 (×4): 400 mg via ORAL
  Filled 2014-08-19 (×6): qty 1

## 2014-08-19 MED ORDER — DEXTROSE 5 % IV SOLN
100.0000 mg | Freq: Two times a day (BID) | INTRAVENOUS | Status: DC
Start: 2014-08-20 — End: 2014-08-21
  Administered 2014-08-20 – 2014-08-21 (×3): 100 mg via INTRAVENOUS
  Filled 2014-08-19 (×5): qty 100

## 2014-08-19 MED ORDER — OCUVITE PO TABS
1.0000 | ORAL_TABLET | Freq: Every day | ORAL | Status: DC
  Administered 2014-08-20 – 2014-08-21 (×2): 1 via ORAL
  Filled 2014-08-19 (×2): qty 1

## 2014-08-19 MED ORDER — ACETAMINOPHEN 650 MG RE SUPP: 650.0000 mg | Freq: Four times a day (QID) | RECTAL | Status: DC | PRN

## 2014-08-19 MED ORDER — IOHEXOL 300 MG/ML  SOLN
100.0000 mL | Freq: Once | INTRAMUSCULAR | Status: AC | PRN
  Administered 2014-08-19: 100 mL via INTRAVENOUS

## 2014-08-19 MED ORDER — ACETAMINOPHEN 325 MG PO TABS
650.0000 mg | ORAL_TABLET | Freq: Four times a day (QID) | ORAL | Status: DC | PRN
  Administered 2014-08-19 – 2014-08-20 (×3): 650 mg via ORAL
  Filled 2014-08-19 (×2): qty 2

## 2014-08-19 MED ORDER — SODIUM CHLORIDE 0.9 % IJ SOLN
3.0000 mL | Freq: Two times a day (BID) | INTRAMUSCULAR | Status: DC
  Administered 2014-08-19 – 2014-08-20 (×2): 3 mL via INTRAVENOUS

## 2014-08-19 MED ORDER — ACETAMINOPHEN 500 MG PO TABS: 1000.0000 mg | ORAL_TABLET | Freq: Four times a day (QID) | ORAL | Status: DC | PRN

## 2014-08-19 MED ORDER — DEXTROSE 5 % IV SOLN
500.0000 mg | INTRAVENOUS | Status: DC
  Administered 2014-08-19: 500 mg via INTRAVENOUS
  Filled 2014-08-19: qty 500

## 2014-08-19 MED ORDER — ENOXAPARIN SODIUM 40 MG/0.4ML ~~LOC~~ SOLN
40.0000 mg | SUBCUTANEOUS | Status: DC
  Administered 2014-08-19 – 2014-08-20 (×2): 40 mg via SUBCUTANEOUS
  Filled 2014-08-19 (×2): qty 0.4

## 2014-08-19 MED ORDER — AMLODIPINE BESYLATE 10 MG PO TABS
10.0000 mg | ORAL_TABLET | Freq: Every day | ORAL | Status: DC
  Administered 2014-08-20: 10 mg via ORAL
  Filled 2014-08-19: qty 1

## 2014-08-19 MED ORDER — IOHEXOL 300 MG/ML  SOLN
50.0000 mL | Freq: Once | INTRAMUSCULAR | Status: AC | PRN
  Administered 2014-08-19: 50 mL via ORAL

## 2014-08-19 MED ORDER — OXYCODONE HCL 5 MG PO TABS
5.0000 mg | ORAL_TABLET | ORAL | Status: DC | PRN
  Administered 2014-08-19 – 2014-08-21 (×4): 5 mg via ORAL
  Filled 2014-08-19 (×3): qty 1

## 2014-08-19 MED ORDER — DEXTROSE 5 % IV SOLN
1.0000 g | INTRAVENOUS | Status: DC
  Administered 2014-08-19 – 2014-08-20 (×2): 1 g via INTRAVENOUS
  Filled 2014-08-19 (×3): qty 10

## 2014-08-19 MED ORDER — ONDANSETRON HCL 4 MG/2ML IJ SOLN
4.0000 mg | Freq: Once | INTRAMUSCULAR | Status: AC
  Administered 2014-08-19: 4 mg via INTRAVENOUS
  Filled 2014-08-19: qty 2

## 2014-08-19 MED ORDER — LORATADINE 10 MG PO TABS
10.0000 mg | ORAL_TABLET | Freq: Every day | ORAL | Status: DC
  Administered 2014-08-20 – 2014-08-21 (×2): 10 mg via ORAL
  Filled 2014-08-19 (×2): qty 1

## 2014-08-19 MED ORDER — SODIUM CHLORIDE 0.9 % IV BOLUS (SEPSIS)
500.0000 mL | INTRAVENOUS | Status: AC
  Administered 2014-08-19: 500 mL via INTRAVENOUS

## 2014-08-19 MED ORDER — SODIUM CHLORIDE 0.9 % IV BOLUS (SEPSIS)
1000.0000 mL | INTRAVENOUS | Status: AC
  Administered 2014-08-19 (×2): 1000 mL via INTRAVENOUS

## 2014-08-19 NOTE — ED Notes (Signed)
Pt c/o fever, tachycardia, generalized body pains, productive cough with yellow sputum, abdominal pain, nausea onset Wednesday. Pt went on cruise through [place], returned last Saturday.

## 2014-08-19 NOTE — ED Notes (Signed)
Critical results shown to White

## 2014-08-19 NOTE — ED Notes (Signed)
Bed: GM01 Expected date:  Expected time:  Means of arrival:  Comments: Hold for triage 1

## 2014-08-19 NOTE — ED Provider Notes (Signed)
CSN: [id number]     Arrival date & time 08/19/14  1324 History   First MD Initiated Contact with Patient 08/19/14 1420     Chief Complaint  Patient presents with  . Fever  . Tachycardia     (Consider location/radiation/quality/duration/timing/severity/associated sxs/prior Treatment) HPI Comments: Patient here with cough fever and myalgias 2 days. Recently came back from a cruise. Denies any vomiting or diarrhea. Does also have left upper abdominal pain. Cough has been nonproductive. Denies any rashes. No neck pain or photophobia. Took a temperature at home and was about 101. Presents here at this time  Patient is a 64 y.o. female presenting with fever. The history is provided by the patient.  Fever   Past Medical History  Diagnosis Date  . Hypertension   . Abnormal transaminases   . Proctitis   . Arthritis   . Esophageal stricture   . Hiatal hernia   . Vitamin B12 deficiency   . Anxiety   . Ulcerative colitis     left sided  . GERD (gastroesophageal reflux disease)   . Maxillary sinusitis   . Hyperlipidemia    Past Surgical History  Procedure Laterality Date  . Cesarean section    . Nose surg    . Nose surgery     Family History  Problem Relation Age of Onset  . Arthritis Mother   . Dementia Mother    History  Substance Use Topics  . Smoking status: Never Smoker   . Smokeless tobacco: Never Used  . Alcohol Use: No   OB History    No data available     Review of Systems  Constitutional: Positive for fever.  All other systems reviewed and are negative.     Allergies  Alendronate sodium and Sulfonamide derivatives  Home Medications   Prior to Admission medications   Medication Sig Start Date End Date Taking? Authorizing Provider  acetaminophen (TYLENOL) 500 MG tablet Take 1,000 mg by mouth every 6 (six) hours as needed (chills).   Yes Historical Provider, MD  amLODipine (NORVASC) 10 MG tablet Take one tablet daily for blood pressure 06/02/14  Yes Robyn Haber, MD  cetirizine (ZYRTEC ALLERGY) 10 MG tablet Take 1 tablet (10 mg total) by mouth daily. 01/27/14  Yes Robyn Haber, MD  esomeprazole (NEXIUM) 20 MG capsule Take 20 mg by mouth daily as needed (indigestion).    Yes Historical Provider, MD  losartan (COZAAR) 50 MG tablet Take 1 tablet (50 mg total) by mouth daily. 01/27/14  Yes Robyn Haber, MD  Mesalamine (DELZICOL) 400 MG CPDR DR capsule Take 1 capsule (400 mg total) by mouth 2 (two) times daily. One capsule daily 01/27/14  Yes Robyn Haber, MD  Multiple Vitamins-Minerals (VISION VITAMINS) TABS Take 1 tablet by mouth daily.   Yes Historical Provider, MD  oseltamivir (TAMIFLU) 75 MG capsule Take 1 capsule (75 mg total) by mouth 2 (two) times daily. Patient not taking: Reported on 08/19/2014 03/07/14   Roselee Culver, MD  promethazine-codeine [facility] WITH CODEINE) 6.25-10 MG/5ML syrup Take 5-10 mLs by mouth every 4 (four) hours as needed for cough. Patient not taking: Reported on 08/19/2014 03/07/14   Roselee Culver, MD  pseudoephedrine-guaifenesin [facility] D) 60-600 MG per tablet Take 1 tablet by mouth every 12 (twelve) hours. Patient not taking: Reported on 08/19/2014 03/07/14 03/07/15  Roselee Culver, MD   BP 128/70 mmHg  Pulse 128  Temp(Src) 100.3 F (37.9 C) (Oral)  Resp 19  SpO2 97% Physical Exam  Constitutional: She is oriented to person, place, and time. She appears well-developed and well-nourished.  Non-toxic appearance. No distress.  HENT:  Head: Normocephalic and atraumatic.  Eyes: Conjunctivae, EOM and lids are normal. Pupils are equal, round, and reactive to light.  Neck: Normal range of motion. Neck supple. No tracheal deviation present. No thyroid mass present.  Cardiovascular: Regular rhythm and normal heart sounds.  Tachycardia present.  Exam reveals no gallop.   No murmur heard. Pulmonary/Chest: Effort normal and breath sounds normal. No stridor. No respiratory distress. She has no decreased  breath sounds. She has no wheezes. She has no rhonchi. She has no rales.  Abdominal: Soft. Normal appearance and bowel sounds are normal. She exhibits no distension. There is tenderness in the epigastric area and left upper quadrant. There is no rigidity, no rebound, no guarding and no CVA tenderness.    Musculoskeletal: Normal range of motion. She exhibits no edema or tenderness.  Neurological: She is alert and oriented to person, place, and time. She has normal strength. No cranial nerve deficit or sensory deficit. GCS eye subscore is 4. GCS verbal subscore is 5. GCS motor subscore is 6.  Skin: Skin is warm and dry. No abrasion and no rash noted.  Psychiatric: She has a normal mood and affect. Her speech is normal and behavior is normal.  Nursing note and vitals reviewed.   ED Course  Procedures (including critical care time) Labs Review Labs Reviewed  CBC WITH DIFFERENTIAL/PLATELET - Abnormal; Notable for the following:    WBC 15.2 (*)    Hemoglobin 15.1 (*)    Neutrophils Relative % 91 (*)    Neutro Abs 13.8 (*)    Lymphocytes Relative 4 (*)    Lymphs Abs 0.6 (*)    All other components within normal limits  COMPREHENSIVE METABOLIC PANEL - Abnormal; Notable for the following:    Potassium 3.4 (*)    Glucose, Bld 125 (*)    ALT 68 (*)    Alkaline Phosphatase 142 (*)    All other components within normal limits  URINALYSIS, ROUTINE W REFLEX MICROSCOPIC - Abnormal; Notable for the following:    Hgb urine dipstick MODERATE (*)    Leukocytes, UA SMALL (*)    All other components within normal limits  I-STAT CG4 LACTIC ACID, ED - Abnormal; Notable for the following:    Lactic Acid, Venous 2.02 (*)    All other components within normal limits  CULTURE, BLOOD (ROUTINE X 2)  CULTURE, BLOOD (ROUTINE X 2)  URINE CULTURE  URINE MICROSCOPIC-ADD ON    Imaging Review Dg Chest 2 View  08/19/2014   CLINICAL DATA:  Sepsis, yellow sputum in, productive cough  EXAM: CHEST  2 VIEW   COMPARISON:  06/03/2011  FINDINGS: There is left lower lobe airspace disease concerning for pneumonia. There is no other focal consolidation. There is no pleural effusion or pneumothorax. The heart and mediastinal contours are unremarkable.  The osseous structures are unremarkable.  IMPRESSION: Left lower lobe pneumonia. Followup PA and lateral chest X-ray is recommended in 3-4 weeks following trial of antibiotic therapy to ensure resolution and exclude underlying malignancy.   Electronically Signed   By: Kathreen Devoid   On: 08/19/2014 14:32     EKG Interpretation   Date/Time:  Thursday Aug 19 2014 13:37:56 EDT Ventricular Rate:  129 PR Interval:  116 QRS Duration: 79 QT Interval:  405 QTC Calculation: 593 R Axis:   68 Text Interpretation:  Sinus tachycardia Probable left atrial  enlargement  Borderline repolarization abnormality Prolonged QT interval Confirmed by  Zenia Resides  MD, Slade Pierpoint ([id number]) on 08/19/2014 4:16:43 PM      MDM   Final diagnoses:  Abdominal pain    Patient started on IV antibiotics for pneumonia seen on chest x-ray. She also has a history of ulcerative colitis and complains of significant abdominal pain. Will obtain abdominal CT scan and signed out to oncoming provider    Lacretia Leigh, MD 08/19/14 [phone]

## 2014-08-19 NOTE — ED Provider Notes (Signed)
Care assumed from Dr. Zenia Resides at shift change. Patient awaiting a CT scan to rule out a flareup of her colitis. She was diagnosed today with pneumonia, however was also experiencing this abdominal pain. CT scan turns out to be negative for acute process, only showing the infiltrate in the left lung. She has been given Rocephin and Zithromax for treatment of community-acquired pneumonia. As she is tachycardic, as a leukocytosis, and borderline hypoxic, she will be admitted to the hospitalist service under the care of Dr. Clementeen Graham.  Veryl Speak, MD 08/19/14 [phone]

## 2014-08-19 NOTE — H&P (Addendum)
[facility] History and Physical  Jessica Stephens VQM:[id number] DOB: [birth year] DOA: 08/19/2014  Referring physician: Dr. Zenia Resides PCP: Jenny Reichmann, MD   Chief Complaint:  Fever with chills since one day  HPI:  64 year old female with history of ulcerative colitis, hypertension, arthritis who presented to the ED with 1 day history of fever and chills with generalized weakness. Patient returned from  a cruise in the very thin recently. Last evening she started having generalized body aches and some chills. This morning symptoms worsened and when she took her temperature it was around 109F. Patient had headache and generalized malaise. Reported some dry cough as well and some sinus congestion. He reports poor appetite. also started having left-sided abdominal pain. Denies diarrhea or dysuria. Denies joint pains. Denies any sick contacts. Patient denies headache, dizziness,nausea , vomiting, chest pain, palpitations, SOB,  bowel or urinary symptoms. Denies change in weight .Marland Kitchen  In the ED patient was febrile with temperature of 100.9F, tachycardic up 135 normal blood pressure and O2 sat. Blood showed leukocytosis with WBC 15k, normal hemoglobin and platelets. Chemistry showed potassium of 3.4. Chest x-ray showed left lower lobe pneumonia. Lactic acid was elevated to 2.2. CT abdomen and pelvis done given symptoms of abdominal pain and elevated lactate showed left lower lobe pneumonia without any acute intra-abdominal inflammation or infection. EKG done showed tachycardia with prolonged QTC of 593  Patient met criteria for sepsis. She was given a dose of IV Rocephin and azithromycin and hospitalists admission requested to telemetry.    Review of Systems:  Constitutional: fever, chills, fatigue and poor appetite,  HEENT: Denies visual or hearing symptoms, congestion +, denies difficulty swallowing, neck pain or stiffness Respiratory: Denies SOB, DOE, cough, chest tightness,  and wheezing.    Cardiovascular: Denies chest pain, palpitations and leg swelling.  Gastrointestinal: Abdominal pain and distention, Denies nausea, vomiting,  diarrhea, constipation, blood in stool and  Genitourinary: Denies dysuria, urgency, frequency, hematuria, flank pain and difficulty urinating.  Endocrine: Denies: hot or cold intolerance, polyuria, polydipsia. Musculoskeletal: myalgias++, Denies  back pain, joint swelling, arthralgias and gait problem.  Skin: Denies  rash and wound.  Neurological: Weakness, Denies dizziness, seizures, syncope,  light-headedness, numbness and headaches.  Hematological: Denies adenopathy.  Psychiatric/Behavioral: Denies confusion    Past Medical History  Diagnosis Date  . Hypertension   . Abnormal transaminases   . Proctitis   . Arthritis   . Esophageal stricture   . Hiatal hernia   . Vitamin B12 deficiency   . Anxiety   . Ulcerative colitis     left sided  . GERD (gastroesophageal reflux disease)   . Maxillary sinusitis   . Hyperlipidemia    Past Surgical History  Procedure Laterality Date  . Cesarean section    . Nose surg    . Nose surgery     Social History:  reports that she has never smoked. She has never used smokeless tobacco. She reports that she does not drink alcohol or use illicit drugs.  Allergies  Allergen Reactions  . Alendronate Sodium   . Sulfonamide Derivatives     Family History  Problem Relation Age of Onset  . Arthritis Mother   . Dementia Mother     Prior to Admission medications   Medication Sig Start Date End Date Taking? Authorizing Provider  acetaminophen (TYLENOL) 500 MG tablet Take 1,000 mg by mouth every 6 (six) hours as needed (chills).   Yes Historical Provider, MD  amLODipine (NORVASC) 10 MG tablet Take  one tablet daily for blood pressure 06/02/14  Yes Kurt Lauenstein, MD  cetirizine (ZYRTEC ALLERGY) 10 MG tablet Take 1 tablet (10 mg total) by mouth daily. 01/27/14  Yes Kurt Lauenstein, MD  esomeprazole (NEXIUM)  20 MG capsule Take 20 mg by mouth daily as needed (indigestion).    Yes Historical Provider, MD  losartan (COZAAR) 50 MG tablet Take 1 tablet (50 mg total) by mouth daily. 01/27/14  Yes Kurt Lauenstein, MD  Mesalamine (DELZICOL) 400 MG CPDR DR capsule Take 1 capsule (400 mg total) by mouth 2 (two) times daily. One capsule daily 01/27/14  Yes Kurt Lauenstein, MD  Multiple Vitamins-Minerals (VISION VITAMINS) TABS Take 1 tablet by mouth daily.   Yes Historical Provider, MD  oseltamivir (TAMIFLU) 75 MG capsule Take 1 capsule (75 mg total) by mouth 2 (two) times daily. Patient not taking: Reported on 08/19/2014 03/07/14   Jeffery S Anderson, MD  promethazine-codeine (PHENERGAN WITH CODEINE) 6.25-10 MG/5ML syrup Take 5-10 mLs by mouth every 4 (four) hours as needed for cough. Patient not taking: Reported on 08/19/2014 03/07/14   Jeffery S Anderson, MD  pseudoephedrine-guaifenesin (MUCINEX D) 60-600 MG per tablet Take 1 tablet by mouth every 12 (twelve) hours. Patient not taking: Reported on 08/19/2014 03/07/14 03/07/15  Jeffery S Anderson, MD     Physical Exam:  Filed Vitals:   08/19/14 1334 08/19/14 1453 08/19/14 1646 08/19/14 1700  BP: 142/67 128/70 124/56 126/51  Pulse: 135 128 104 106  Temp: 100.3 F (37.9 C)     TempSrc: Oral     Resp: 22 19 20   SpO2: 96% 97% 96% 93%    Constitutional: Vital signs reviewed.  Middle aged female lying in bed in no acute distress HEENT: no pallor, no icterus, moist oral mucosa, no cervical lymphadenopathy, supple neck Cardiovascular: RRR, S1 normal, S2 normal, no MRG Chest: Left basilar crackles, no rhonchi or wheeze GI: Soft.  non-distended, bowel sounds are normal, no masses, left lower quadrant and CVA tenderness.  musculoskeletal: warm, no edema Neurological: A&O x3, non focal  Labs on Admission:  Basic Metabolic Panel:  Recent Labs Lab 08/19/14 1351  NA 136  K 3.4*  CL 105  CO2 24  GLUCOSE 125*  BUN 14  CREATININE 0.61  CALCIUM 8.9   Liver  Function Tests:  Recent Labs Lab 08/19/14 1351  AST 33  ALT 68*  ALKPHOS 142*  BILITOT 1.1  PROT 7.7  ALBUMIN 4.3   No results for input(s): LIPASE, AMYLASE in the last 168 hours. No results for input(s): AMMONIA in the last 168 hours. CBC:  Recent Labs Lab 08/19/14 1351  WBC 15.2*  NEUTROABS 13.8*  HGB 15.1*  HCT 44.1  MCV 88.4  PLT 266   Cardiac Enzymes: No results for input(s): CKTOTAL, CKMB, CKMBINDEX, TROPONINI in the last 168 hours. BNP: Invalid input(s): POCBNP CBG: No results for input(s): GLUCAP in the last 168 hours.  Radiological Exams on Admission: Dg Chest 2 View  08/19/2014   CLINICAL DATA:  Sepsis, yellow sputum in, productive cough  EXAM: CHEST  2 VIEW  COMPARISON:  06/03/2011  FINDINGS: There is left lower lobe airspace disease concerning for pneumonia. There is no other focal consolidation. There is no pleural effusion or pneumothorax. The heart and mediastinal contours are unremarkable.  The osseous structures are unremarkable.  IMPRESSION: Left lower lobe pneumonia. Followup PA and lateral chest X-ray is recommended in 3-4 weeks following trial of antibiotic therapy to ensure resolution and exclude underlying malignancy.     Electronically Signed   By: Kathreen Devoid   On: 08/19/2014 14:32   Ct Abdomen Pelvis W Contrast  08/19/2014   CLINICAL DATA:  Mid abdominal pain starting yesterday, cough, history of colitis  EXAM: CT ABDOMEN AND PELVIS WITH CONTRAST  TECHNIQUE: Multidetector CT imaging of the abdomen and pelvis was performed using the standard protocol following bolus administration of intravenous contrast.  CONTRAST:  66m OMNIPAQUE IOHEXOL 300 MG/ML SOLN, 1058mOMNIPAQUE IOHEXOL 300 MG/ML SOLN  COMPARISON:  04/07/2010 and chest x-ray today  FINDINGS: As noted on today chest x-ray there is infiltrate/ pneumonia with air bronchogram in left lower lobe posterior laterally.  Sagittal images of the spine shows diffuse osteopenia. Stable mild compression  deformity upper endplate of L3 vertebral body.  Small hiatal hernia is noted. Enhanced liver is unremarkable. Again noted a gallbladder septum. Small layering calcified gallstones are noted gallbladder fundal region.  Pancreas, spleen and adrenal glands are unremarkable. Kidneys are symmetrical in size and enhancement. No hydronephrosis or hydroureter. No small bowel obstruction. No ascites or free air. No adenopathy. Moderate stool noted in right colon and proximal transverse colon. The terminal ileum is unremarkable. Normal appendix. No pericecal inflammation.  The uterus and adnexa are unremarkable. The urinary bladder is unremarkable. No pelvic ascites or adenopathy. No inguinal adenopathy. No distal colonic obstruction. No colitis or diverticulitis.  Delayed renal images shows bilateral renal symmetrical excretion. Bilateral visualized proximal ureter is unremarkable.  IMPRESSION: 1. As noted on today chest x-ray there is infiltrate/pneumonia in left lower lobe posterior laterally. 2. Osteopenia noted thoracolumbar spine. Stable mild compression deformity upper endplate of L3 vertebral body. 3. No acute inflammatory process within abdomen or pelvis. 4. Again noted a septation within gallbladder. Small layering calcified gallstones are noted within gallbladder fundus. No pericholecystic fluid. 5. No small bowel obstruction. 6. No pericecal inflammation.  Normal appendix. 7. Unremarkable uterus and adnexal.   Electronically Signed   By: LiLahoma Crocker.D.   On: 08/19/2014 16:41    EKG: Independently reviewed Sinus tachycardia at 129 with prolonged QTC of 593  Assessment/Plan Principal Problem:    Sepsis secondary to Community acquired pneumonia Admit to telemetry. Sepsis pathway initiated on admission Ordered 2.5 L NS bolus , repeat lactic acid post IV fluids. Check pro calcitonin level. Follow blood cultures, check urine for strep antigen and Legionella antigen.. She received IV rocephin and azithromycin  in the ED. Given prolonged QTC would avoid quinolones or macrolides. Would place her on rocephin and doxycycline. -Check flu PCR 02 via [facility] prn. Supportive care with Tylenol and antitussives.   Active problems History of ulcerative colitis Previously followed by Dr. PaSharlett IlesHas not seen anyone for the past 1 year. Continue supplement.  Essential hypertension.  Stable. Resume home medications  GERD Continue PPI  Hypokalemia Replenished  Long Qtc  Replenish low potassium. Check magnesium. Avoid QTC prolonging agents.  Diet: regular  DVT prophylaxis: sq lovenox   Code Status: full code Family Communication: discussed with patient and her husband at bedside Disposition Plan: Admit to telemetry. Home likely in the next 48-72 hours  Durrell Barajas [facility] Pager [phone] time spent on admission :70 minutes  If 7PM-7AM, please contact night-coverage [email or web address] Password [email or web address]/19/2016, 5:47 PM

## 2014-08-20 LAB — CBC
HCT: 34.5 % — ABNORMAL LOW (ref 36.0–46.0)
Hemoglobin: 11.4 g/dL — ABNORMAL LOW (ref 12.0–15.0)
MCH: 29.6 pg (ref 26.0–34.0)
MCHC: 33 g/dL (ref 30.0–36.0)
MCV: 89.6 fL (ref 78.0–100.0)
PLATELETS: 234 10*3/uL (ref 150–400)
RBC: 3.85 MIL/uL — AB (ref 3.87–5.11)
RDW: 13.7 % (ref 11.5–15.5)
WBC: 22.8 10*3/uL — ABNORMAL HIGH (ref 4.0–10.5)

## 2014-08-20 LAB — BASIC METABOLIC PANEL
Anion gap: 10 (ref 5–15)
BUN: 13 mg/dL (ref 6–20)
CHLORIDE: 106 mmol/L (ref 101–111)
CO2: 23 mmol/L (ref 22–32)
CREATININE: 0.77 mg/dL (ref 0.44–1.00)
Calcium: 8.1 mg/dL — ABNORMAL LOW (ref 8.9–10.3)
GFR calc non Af Amer: >60 mL/min (ref >60)
GLUCOSE: 92 mg/dL (ref 65–99)
POTASSIUM: 3.4 mmol/L — AB (ref 3.5–5.1)
Sodium: 139 mmol/L (ref 135–145)

## 2014-08-20 LAB — LEGIONELLA ANTIGEN, URINE

## 2014-08-20 LAB — STREP PNEUMONIAE URINARY ANTIGEN: Strep Pneumo Urinary Antigen: NEGATIVE

## 2014-08-20 LAB — URINE CULTURE
Colony Count: NO GROWTH
Culture: NO GROWTH

## 2014-08-20 LAB — HIV ANTIBODY (ROUTINE TESTING W REFLEX): HIV Screen 4th Generation wRfx: NONREACTIVE

## 2014-08-20 LAB — LACTIC ACID, PLASMA: Lactic Acid, Venous: 1.5 mmol/L (ref 0.5–2.0)

## 2014-08-20 LAB — INFLUENZA PANEL BY PCR (TYPE A & B)
H1N1 flu by pcr: NOT DETECTED
INFLAPCR: NEGATIVE
Influenza B By PCR: NEGATIVE

## 2014-08-20 MED ORDER — SODIUM CHLORIDE 0.9 % IV SOLN
INTRAVENOUS | Status: AC
  Administered 2014-08-20 – 2014-08-21 (×3): via INTRAVENOUS

## 2014-08-20 MED ORDER — POTASSIUM CHLORIDE CRYS ER 20 MEQ PO TBCR
40.0000 meq | EXTENDED_RELEASE_TABLET | Freq: Once | ORAL | Status: AC
  Administered 2014-08-20: 40 meq via ORAL
  Filled 2014-08-20: qty 2

## 2014-08-20 MED ORDER — DIPHENHYDRAMINE HCL 25 MG PO CAPS
25.0000 mg | ORAL_CAPSULE | Freq: Every evening | ORAL | Status: DC | PRN
  Administered 2014-08-20: 25 mg via ORAL
  Filled 2014-08-20: qty 1

## 2014-08-20 NOTE — Progress Notes (Signed)
[facility] PROGRESS NOTE  Jessica Stephens NIO:[id number] DOB: 1950/06/14 DOA: 08/19/2014 PCP: Jessica Reichmann, MD  Assessment/Plan: Principal Problem:   Sepsis secondary to Community acquired pneumonia Sepsis pathway initiated on admission received 2.5 L NS bolus , repeat lactic acid post IV fluids normalized.  pro calcitonin level normal.  blood cultures so far, negative urine for strep antigen and Legionella antigen and flu PCR. She received IV rocephin and azithromycin in the ED. Given prolonged QTC placed her on rocephin and doxycycline. She has worsening leukocytosis but overall improved clinically. Blood pressure is low normal so will hold her home blood pressure medications and place on maintenance fluid. 02 via [facility] prn. Supportive care with Tylenol and antitussives.   Active problems  Prolonged QTC  Replenish K. Magnesium normal. Repeat EKG today. Avoid QTC prolonging agents  History of ulcerative colitis Previously followed by Dr. Sharlett Iles. Has not seen anyone for the past 1 year. Continue mesalamine.  Essential hypertension. Blood pressure low normal. Hold home blood pressure medications  GERD Continue PPI  Hypokalemia Replenished  Diet: regular  DVT prophylaxis: sq lovenox   Code Status: full code Family Communication: None at bedside Disposition Plan: Home possibly in the next 24-48 hours if continues to improve.   HPI/Subjective: Patient seen and examined. Remains afebrile overnight. Reports feeling much better today still has some pain in her shoulders and back of the neck.  Objective: Filed Vitals:   08/20/14 1302  BP: 95/40  Pulse: 77  Temp: 97.8 F (36.6 C)  Resp: 20    Intake/Output Summary (Last 24 hours) at 08/20/14 1318 Last data filed at 08/20/14 1245  Gross per 24 hour  Intake    970 ml  Output   1300 ml  Net   -330 ml   Filed Weights   08/19/14 1933  Weight: 74 kg (163 lb 2.3 oz)    Exam:   General: Middle aged female  in no acute distress  HEENT: No pallor, moist oral mucosa, supple neck, no neck rigidity or stiffness  Cardiovascular: Normal S1 and S2, no murmurs rub or gallop  Respiratory: Bibasilar crackles, no rhonchi or wheeze  Abdomen:, Nondistended, nontender, bowel sounds present  Musculoskeletal: On, no edema  CNS: Alert and oriented  Data Reviewed: Basic Metabolic Panel:  Recent Labs Lab 08/19/14 1351 08/19/14 2020 08/20/14 0545  NA 136 140 139  K 3.4* 3.1* 3.4*  CL 105 107 106  CO2 24 21* 23  GLUCOSE 125* 109* 92  BUN 14 11 13   CREATININE 0.61 0.67 0.77  CALCIUM 8.9 8.1* 8.1*  MG  --  1.6*  --    Liver Function Tests:  Recent Labs Lab 08/19/14 1351 08/19/14 2020  AST 33 25  ALT 68* 51  ALKPHOS 142* 114  BILITOT 1.1 0.9  PROT 7.7 6.4*  ALBUMIN 4.3 3.6   No results for input(s): LIPASE, AMYLASE in the last 168 hours. No results for input(s): AMMONIA in the last 168 hours. CBC:  Recent Labs Lab 08/19/14 1351 08/19/14 2020 08/20/14 0545  WBC 15.2* 23.6* 22.8*  NEUTROABS 13.8* 20.8*  --   HGB 15.1* 12.0 11.4*  HCT 44.1 35.8* 34.5*  MCV 88.4 88.6 89.6  PLT 266 227 234   Cardiac Enzymes: No results for input(s): CKTOTAL, CKMB, CKMBINDEX, TROPONINI in the last 168 hours. BNP (last 3 results) No results for input(s): BNP in the last 8760 hours.  ProBNP (last 3 results) No results for input(s): PROBNP in the last 8760 hours.  CBG: No results for input(s): GLUCAP in the last 168 hours.  Recent Results (from the past 240 hour(s))  Culture, blood (routine x 2)     Status: None (Preliminary result)   Collection Time: 08/19/14  1:51 PM  Result Value Ref Range Status   Specimen Description BLOOD RIGHT ANTECUBITAL  Final   Special Requests BOTTLES DRAWN AEROBIC AND ANAEROBIC 5 CC EA  Final   Culture   Final           BLOOD CULTURE RECEIVED NO GROWTH TO DATE CULTURE WILL BE HELD FOR 5 DAYS BEFORE ISSUING A FINAL NEGATIVE REPORT Performed at [facility]    Report Status PENDING  Incomplete  Culture, blood (routine x 2)     Status: None (Preliminary result)   Collection Time: 08/19/14  3:00 PM  Result Value Ref Range Status   Specimen Description BLOOD RIGHT ANTECUBITAL  Final   Special Requests BOTTLES DRAWN AEROBIC AND ANAEROBIC 5CC EACH  Final   Culture   Final           BLOOD CULTURE RECEIVED NO GROWTH TO DATE CULTURE WILL BE HELD FOR 5 DAYS BEFORE ISSUING A FINAL NEGATIVE REPORT Performed at [facility]    Report Status PENDING  Incomplete     Studies: Dg Chest 2 View  08/19/2014   CLINICAL DATA:  Sepsis, yellow sputum in, productive cough  EXAM: CHEST  2 VIEW  COMPARISON:  06/03/2011  FINDINGS: There is left lower lobe airspace disease concerning for pneumonia. There is no other focal consolidation. There is no pleural effusion or pneumothorax. The heart and mediastinal contours are unremarkable.  The osseous structures are unremarkable.  IMPRESSION: Left lower lobe pneumonia. Followup PA and lateral chest X-ray is recommended in 3-4 weeks following trial of antibiotic therapy to ensure resolution and exclude underlying malignancy.   Electronically Signed   By: Kathreen Devoid   On: 08/19/2014 14:32   Ct Abdomen Pelvis W Contrast  08/19/2014   CLINICAL DATA:  Mid abdominal pain starting yesterday, cough, history of colitis  EXAM: CT ABDOMEN AND PELVIS WITH CONTRAST  TECHNIQUE: Multidetector CT imaging of the abdomen and pelvis was performed using the standard protocol following bolus administration of intravenous contrast.  CONTRAST:  71m OMNIPAQUE IOHEXOL 300 MG/ML SOLN, 1051mOMNIPAQUE IOHEXOL 300 MG/ML SOLN  COMPARISON:  04/07/2010 and chest x-ray today  FINDINGS: As noted on today chest x-ray there is infiltrate/ pneumonia with air bronchogram in left lower lobe posterior laterally.  Sagittal images of the spine shows diffuse osteopenia. Stable mild compression deformity upper endplate of L3 vertebral body.  Small hiatal  hernia is noted. Enhanced liver is unremarkable. Again noted a gallbladder septum. Small layering calcified gallstones are noted gallbladder fundal region.  Pancreas, spleen and adrenal glands are unremarkable. Kidneys are symmetrical in size and enhancement. No hydronephrosis or hydroureter. No small bowel obstruction. No ascites or free air. No adenopathy. Moderate stool noted in right colon and proximal transverse colon. The terminal ileum is unremarkable. Normal appendix. No pericecal inflammation.  The uterus and adnexa are unremarkable. The urinary bladder is unremarkable. No pelvic ascites or adenopathy. No inguinal adenopathy. No distal colonic obstruction. No colitis or diverticulitis.  Delayed renal images shows bilateral renal symmetrical excretion. Bilateral visualized proximal ureter is unremarkable.  IMPRESSION: 1. As noted on today chest x-ray there is infiltrate/pneumonia in left lower lobe posterior laterally. 2. Osteopenia noted thoracolumbar spine. Stable mild compression deformity upper endplate of L3 vertebral body.  3. No acute inflammatory process within abdomen or pelvis. 4. Again noted a septation within gallbladder. Small layering calcified gallstones are noted within gallbladder fundus. No pericholecystic fluid. 5. No small bowel obstruction. 6. No pericecal inflammation.  Normal appendix. 7. Unremarkable uterus and adnexal.   Electronically Signed   By: Lahoma Crocker M.D.   On: 08/19/2014 16:41   Dg Chest Port 1 View  08/19/2014   CLINICAL DATA:  Sepsis, fever, tachycardia, generalized body pains, productive cough yellow sputum, abdominal pain, and nausea beginning Wednesday, recently returned from a cruise to the [place] last Saturday, past history hypertension, hiatal hernia  EXAM: PORTABLE CHEST - 1 VIEW  COMPARISON:  Portable exam 1750 hours compared to 08/19/2014 at 1417 hours  FINDINGS: Borderline enlargement of cardiac silhouette.  Mediastinal contours and pulmonary vascularity  normal.  Atelectasis versus infiltrate in LEFT lower lobe.  Remaining lungs clear.  No pleural effusion or pneumothorax.  Bones demineralized.  IMPRESSION: Mild atelectasis versus infiltrate in LEFT lower lobe.   Electronically Signed   By: Lavonia Dana M.D.   On: 08/19/2014 18:16    Scheduled Meds: . beta carotene w/minerals  1 tablet Oral Daily  . cefTRIAXone (ROCEPHIN)  IV  1 g Intravenous Q24H  . doxycycline (VIBRAMYCIN) IV  100 mg Intravenous Q12H  . enoxaparin (LOVENOX) injection  40 mg Subcutaneous Q24H  . loratadine  10 mg Oral Daily  . Mesalamine  400 mg Oral BID  . pantoprazole  40 mg Oral Daily  . sodium chloride  3 mL Intravenous Q12H   Continuous Infusions: . sodium chloride 75 mL/hr at 08/20/14 1101       Time spent: 25 minutes    Jessica Stephens, [place]  [facility] Pager [phone] If 7PM-7AM, please contact night-coverage at [email or web address], password [facility] 08/20/2014, 1:18 PM  LOS: 1 day

## 2014-08-21 DIAGNOSIS — R9431 Abnormal electrocardiogram [ECG] [EKG]: Secondary | ICD-10-CM | POA: Diagnosis present

## 2014-08-21 DIAGNOSIS — J181 Lobar pneumonia, unspecified organism: Secondary | ICD-10-CM

## 2014-08-21 LAB — BASIC METABOLIC PANEL
Anion gap: 10 (ref 5–15)
BUN: 9 mg/dL (ref 6–20)
CALCIUM: 8.5 mg/dL — AB (ref 8.9–10.3)
CO2: 23 mmol/L (ref 22–32)
Chloride: 107 mmol/L (ref 101–111)
Creatinine, Ser: 0.58 mg/dL (ref 0.44–1.00)
GFR calc Af Amer: >60 mL/min (ref >60)
Glucose, Bld: 99 mg/dL (ref 65–99)
POTASSIUM: 3.9 mmol/L (ref 3.5–5.1)
Sodium: 140 mmol/L (ref 135–145)

## 2014-08-21 LAB — URINALYSIS, ROUTINE W REFLEX MICROSCOPIC
BILIRUBIN URINE: NEGATIVE
GLUCOSE, UA: NEGATIVE mg/dL
KETONES UR: NEGATIVE mg/dL
Nitrite: NEGATIVE
PH: 5.5 (ref 5.0–8.0)
Protein, ur: NEGATIVE mg/dL
Specific Gravity, Urine: 1.009 (ref 1.005–1.030)
UROBILINOGEN UA: 0.2 mg/dL (ref 0.0–1.0)

## 2014-08-21 LAB — CBC
HEMATOCRIT: 34.3 % — AB (ref 36.0–46.0)
Hemoglobin: 11.2 g/dL — ABNORMAL LOW (ref 12.0–15.0)
MCH: 29.4 pg (ref 26.0–34.0)
MCHC: 32.7 g/dL (ref 30.0–36.0)
MCV: 90 fL (ref 78.0–100.0)
Platelets: 225 10*3/uL (ref 150–400)
RBC: 3.81 MIL/uL — AB (ref 3.87–5.11)
RDW: 13.4 % (ref 11.5–15.5)
WBC: 15.5 10*3/uL — ABNORMAL HIGH (ref 4.0–10.5)

## 2014-08-21 LAB — URINE MICROSCOPIC-ADD ON

## 2014-08-21 MED ORDER — AMOXICILLIN-POT CLAVULANATE 875-125 MG PO TABS: 1.0000 | ORAL_TABLET | Freq: Two times a day (BID) | ORAL | Status: AC

## 2014-08-21 MED ORDER — ONDANSETRON HCL 4 MG/2ML IJ SOLN
4.0000 mg | Freq: Four times a day (QID) | INTRAMUSCULAR | Status: DC | PRN
  Administered 2014-08-21: 4 mg via INTRAVENOUS
  Filled 2014-08-21: qty 2

## 2014-08-21 MED ORDER — PROMETHAZINE HCL 12.5 MG PO TABS: 12.5000 mg | ORAL_TABLET | Freq: Three times a day (TID) | ORAL | Status: DC | PRN

## 2014-08-21 MED ORDER — DOXYCYCLINE HYCLATE 100 MG PO CAPS: 100.0000 mg | ORAL_CAPSULE | Freq: Two times a day (BID) | ORAL | Status: AC

## 2014-08-21 NOTE — Discharge Instructions (Signed)

## 2014-08-21 NOTE — Discharge Summary (Addendum)
Physician Discharge Summary  Jessica Stephens:[id number] DOB: 08-19-1950 DOA: 08/19/2014  PCP: Jenny Reichmann, MD  Admit date: 08/19/2014 Discharge date: 08/21/2014  Time spent: 88mnutes  Recommendations for Outpatient Follow-up:  1. Discharge home with outpatient PCP follow-up in 1 week. 2. Patient will complete 7 days of antibiotics (Augmentin + doxycycline on 5/25). Please repeat CBC during outpatient visit and follow final blood culture results. 3. Please repeat chest x-ray in 4-6 weeks to see for resolution of pneumonia. 4. Please monitor QTc during outpatient follow-up. 5. Please refer patient to outpatient GI for her ulcerative colitis.  Discharge Diagnoses:  Principal Problem:   Sepsis due to pneumonia  Active Problems:   Essential hypertension   G E R D   CAP (community acquired pneumonia)   Hypokalemia   Prolonged Q-T interval on ECG   Discharge Condition: Fair  Diet recommendation: Regular  Filed Weights   08/19/14 1933  Weight: 74 kg (163 lb 2.3 oz)    History of present illness:  Please refer to admission H&P for details, in brief, 64year old female with history of ulcerative colitis, hypertension, arthritis who presented to the ED with 1 day history of fever and chills with generalized weakness. [place] On the previous evening started having generalized body aches and some chills. On the morning of admission symptoms worsened and when she took her temperature it was around 1018F. Patient had headache and generalized malaise. Reported some dry cough as well and some sinus congestion. He reports poor appetite. also started having left-sided abdominal pain. Denied diarrhea or dysuria. Denied joint pains. Denied any sick contacts. Patient denied headache, dizziness,nausea , vomiting, chest pain, palpitations, SOB, bowel or urinary symptoms. Denied change in weight ..Marland Kitchen In the ED patient was febrile with  temperature of 100.18F, tachycardic up 135 normal blood pressure and O2 sat. Blood showed leukocytosis with WBC 15k, normal hemoglobin and platelets. Chemistry showed potassium of 3.4. Chest x-ray showed left lower lobe pneumonia. Lactic acid was elevated to 2.2. CT abdomen and pelvis done given symptoms of abdominal pain and elevated lactate showed left lower lobe pneumonia without any acute intra-abdominal inflammation or infection. EKG done showed tachycardia with prolonged QTC of 593  Patient met criteria for sepsis. She was given a dose of IV Rocephin and azithromycin and hospitalists admission requested to telemetry.    Hospital Course:    Sepsis secondary to Community acquired / lobar pneumonia Sepsis pathway initiated on admission . received 2.5 L NS bolus , repeat lactic acid post IV fluids normalized. pro calcitonin level normal. blood cultures so far negative.  urine for strep antigen ,  Legionella antigen and flu PCR were all negative. She received IV rocephin and azithromycin in the ED. Given prolonged QTC placed her on rocephin and doxycycline. -Patient remains afebrile and leukocytosis improving.. Blood pressure has improved and remained stable. Symptoms overall improved. And patient hemodynamically stable. Sepsis has resolved. -Patient will be discharged on 5 more days of oral Augmentin and doxycycline (due to prolonged QTC),  to complete a seven-day course of antibiotics. She will need follow-up chest x-ray in 4-6 weeks to see for resolution.   Active problems  Prolonged QTc Replenished potassium Magnesium normal. Repeat EKG today shows normal QTC of 430.  History of ulcerative colitis Previously followed by Dr. PSharlett Iles Has not seen anyone for the past 1 year. Continue mesalamine. Recommend outpatient GI referral.  Essential hypertension. Blood pressure medications held due to low  normal blood pressure. Currently stable and will resume upon  discharge.  GERD Continue PPI  Hypokalemia Replenished     Code Status: full code  Family Communication: None at bedside  Disposition Plan: Home with outpatient follow-up   Discharge Exam: Filed Vitals:   08/21/14 0600  BP: 129/53  Pulse: 92  Temp: 99.5 F (37.5 C)  Resp: 20    General: Middle aged female in no acute distress HEENT: No pallor, moist oral mucosa, supple neck Chest: Left basilar crackles, no rhonchi or wheeze CVS: Normal S1 and S2, no murmurs rub or gallop GI: Soft, nondistended, nontender, bowel sounds present Musculoskeletal: Warm, no edema CNS: Alert and oriented   Discharge Instructions    Current Discharge Medication List    START taking these medications   Details  amoxicillin-clavulanate (AUGMENTIN) 875-125 MG per tablet Take 1 tablet by mouth 2 (two) times daily. Qty: 10 tablet, Refills: 0    doxycycline (VIBRAMYCIN) 100 MG capsule Take 1 capsule (100 mg total) by mouth 2 (two) times daily. Qty: 10 capsule, Refills: 0      CONTINUE these medications which have NOT CHANGED   Details  acetaminophen (TYLENOL) 500 MG tablet Take 1,000 mg by mouth every 6 (six) hours as needed (chills).    amLODipine (NORVASC) 10 MG tablet Take one tablet daily for blood pressure Qty: 90 tablet, Refills: 1   Associated Diagnoses: Environmental allergies    cetirizine (ZYRTEC ALLERGY) 10 MG tablet Take 1 tablet (10 mg total) by mouth daily. Qty: 90 tablet, Refills: 3   Associated Diagnoses: Environmental allergies; Essential hypertension    esomeprazole (NEXIUM) 20 MG capsule Take 20 mg by mouth daily as needed (indigestion).     losartan (COZAAR) 50 MG tablet Take 1 tablet (50 mg total) by mouth daily. Qty: 90 tablet, Refills: 3   Associated Diagnoses: Essential hypertension    Mesalamine (DELZICOL) 400 MG CPDR DR capsule Take 1 capsule (400 mg total) by mouth 2 (two) times daily. One capsule daily Qty: 180 capsule, Refills: 3   Associated  Diagnoses: Ulcerative colitis, without complications    Multiple Vitamins-Minerals (VISION VITAMINS) TABS Take 1 tablet by mouth daily.      STOP taking these medications     oseltamivir (TAMIFLU) 75 MG capsule      promethazine-codeine (PHENERGAN WITH CODEINE) 6.25-10 MG/5ML syrup      pseudoephedrine-guaifenesin (MUCINEX D) 60-600 MG per tablet        Allergies  Allergen Reactions  . Alendronate Sodium   . Sulfonamide Derivatives    Follow-up Information    Follow up with DAUB, STEVE A, MD. Schedule an appointment as soon as possible for a visit in 1 week.   Specialty:  Family Medicine   Contact information:   [place] [address] [phone]        The results of significant diagnostics from this hospitalization (including imaging, microbiology, ancillary and laboratory) are listed below for reference.    Significant Diagnostic Studies: Dg Chest 2 View  08/19/2014   CLINICAL DATA:  Sepsis, yellow sputum in, productive cough  EXAM: CHEST  2 VIEW  COMPARISON:  06/03/2011  FINDINGS: There is left lower lobe airspace disease concerning for pneumonia. There is no other focal consolidation. There is no pleural effusion or pneumothorax. The heart and mediastinal contours are unremarkable.  The osseous structures are unremarkable.  IMPRESSION: Left lower lobe pneumonia. Followup PA and lateral chest X-ray is recommended in 3-4 weeks following trial of antibiotic therapy  to ensure resolution and exclude underlying malignancy.   Electronically Signed   By: Kathreen Devoid   On: 08/19/2014 14:32   Ct Abdomen Pelvis W Contrast  08/19/2014   CLINICAL DATA:  Mid abdominal pain starting yesterday, cough, history of colitis  EXAM: CT ABDOMEN AND PELVIS WITH CONTRAST  TECHNIQUE: Multidetector CT imaging of the abdomen and pelvis was performed using the standard protocol following bolus administration of intravenous contrast.  CONTRAST:  36m OMNIPAQUE IOHEXOL 300 MG/ML SOLN,  1061mOMNIPAQUE IOHEXOL 300 MG/ML SOLN  COMPARISON:  04/07/2010 and chest x-ray today  FINDINGS: As noted on today chest x-ray there is infiltrate/ pneumonia with air bronchogram in left lower lobe posterior laterally.  Sagittal images of the spine shows diffuse osteopenia. Stable mild compression deformity upper endplate of L3 vertebral body.  Small hiatal hernia is noted. Enhanced liver is unremarkable. Again noted a gallbladder septum. Small layering calcified gallstones are noted gallbladder fundal region.  Pancreas, spleen and adrenal glands are unremarkable. Kidneys are symmetrical in size and enhancement. No hydronephrosis or hydroureter. No small bowel obstruction. No ascites or free air. No adenopathy. Moderate stool noted in right colon and proximal transverse colon. The terminal ileum is unremarkable. Normal appendix. No pericecal inflammation.  The uterus and adnexa are unremarkable. The urinary bladder is unremarkable. No pelvic ascites or adenopathy. No inguinal adenopathy. No distal colonic obstruction. No colitis or diverticulitis.  Delayed renal images shows bilateral renal symmetrical excretion. Bilateral visualized proximal ureter is unremarkable.  IMPRESSION: 1. As noted on today chest x-ray there is infiltrate/pneumonia in left lower lobe posterior laterally. 2. Osteopenia noted thoracolumbar spine. Stable mild compression deformity upper endplate of L3 vertebral body. 3. No acute inflammatory process within abdomen or pelvis. 4. Again noted a septation within gallbladder. Small layering calcified gallstones are noted within gallbladder fundus. No pericholecystic fluid. 5. No small bowel obstruction. 6. No pericecal inflammation.  Normal appendix. 7. Unremarkable uterus and adnexal.   Electronically Signed   By: LiLahoma Crocker.D.   On: 08/19/2014 16:41   Dg Chest Port 1 View  08/19/2014   CLINICAL DATA:  Sepsis, fever, tachycardia, generalized body pains, productive cough yellow sputum,  abdominal pain, and nausea beginning Wednesday, [place], past history hypertension, hiatal hernia  EXAM: PORTABLE CHEST - 1 VIEW  COMPARISON:  Portable exam 1750 hours compared to 08/19/2014 at 1417 hours  FINDINGS: Borderline enlargement of cardiac silhouette.  Mediastinal contours and pulmonary vascularity normal.  Atelectasis versus infiltrate in LEFT lower lobe.  Remaining lungs clear.  No pleural effusion or pneumothorax.  Bones demineralized.  IMPRESSION: Mild atelectasis versus infiltrate in LEFT lower lobe.   Electronically Signed   By: MaLavonia Dana.D.   On: 08/19/2014 18:16    Microbiology: Recent Results (from the past 240 hour(s))  Urine culture     Status: None   Collection Time: 08/19/14  1:39 PM  Result Value Ref Range Status   Specimen Description URINE, CLEAN CATCH  Final   Special Requests NONE  Final   Colony Count NO GROWTH Performed at [facility] Final   Culture NO GROWTH Performed at [facility] Final   Report Status 08/20/2014 FINAL  Final  Culture, blood (routine x 2)     Status: None (Preliminary result)   Collection Time: 08/19/14  1:51 PM  Result Value Ref Range Status   Specimen Description BLOOD RIGHT ANTECUBITAL  Final  Special Requests BOTTLES DRAWN AEROBIC AND ANAEROBIC 5 CC EA  Final   Culture   Final           BLOOD CULTURE RECEIVED NO GROWTH TO DATE CULTURE WILL BE HELD FOR 5 DAYS BEFORE ISSUING A FINAL NEGATIVE REPORT Performed at [facility]    Report Status PENDING  Incomplete  Culture, blood (routine x 2)     Status: None (Preliminary result)   Collection Time: 08/19/14  3:00 PM  Result Value Ref Range Status   Specimen Description BLOOD RIGHT ANTECUBITAL  Final   Special Requests BOTTLES DRAWN AEROBIC AND ANAEROBIC 5CC EACH  Final   Culture   Final           BLOOD CULTURE RECEIVED NO GROWTH TO DATE CULTURE WILL BE HELD FOR 5 DAYS BEFORE ISSUING A FINAL  NEGATIVE REPORT Performed at [facility]    Report Status PENDING  Incomplete  Culture, blood (x 2)     Status: None (Preliminary result)   Collection Time: 08/19/14  8:20 PM  Result Value Ref Range Status   Specimen Description BLOOD RIGHT HAND  Final   Special Requests AEB 3CC  Final   Culture   Final           BLOOD CULTURE RECEIVED NO GROWTH TO DATE CULTURE WILL BE HELD FOR 5 DAYS BEFORE ISSUING A FINAL NEGATIVE REPORT Performed at [facility]    Report Status PENDING  Incomplete  Culture, blood (x 2)     Status: None (Preliminary result)   Collection Time: 08/19/14  8:28 PM  Result Value Ref Range Status   Specimen Description BLOOD LEFT HAND  Final   Special Requests AEB 5CC  Final   Culture   Final           BLOOD CULTURE RECEIVED NO GROWTH TO DATE CULTURE WILL BE HELD FOR 5 DAYS BEFORE ISSUING A FINAL NEGATIVE REPORT Performed at [facility]    Report Status PENDING  Incomplete     Labs: Basic Metabolic Panel:  Recent Labs Lab 08/19/14 1351 08/19/14 2020 08/20/14 0545 08/21/14 0556  NA 136 140 139 140  K 3.4* 3.1* 3.4* 3.9  CL 105 107 106 107  CO2 24 21* 23 23  GLUCOSE 125* 109* 92 99  BUN 14 11 13 9   CREATININE 0.61 0.67 0.77 0.58  CALCIUM 8.9 8.1* 8.1* 8.5*  MG  --  1.6*  --   --    Liver Function Tests:  Recent Labs Lab 08/19/14 1351 08/19/14 2020  AST 33 25  ALT 68* 51  ALKPHOS 142* 114  BILITOT 1.1 0.9  PROT 7.7 6.4*  ALBUMIN 4.3 3.6   No results for input(s): LIPASE, AMYLASE in the last 168 hours. No results for input(s): AMMONIA in the last 168 hours. CBC:  Recent Labs Lab 08/19/14 1351 08/19/14 2020 08/20/14 0545 08/21/14 0556  WBC 15.2* 23.6* 22.8* 15.5*  NEUTROABS 13.8* 20.8*  --   --   HGB 15.1* 12.0 11.4* 11.2*  HCT 44.1 35.8* 34.5* 34.3*  MCV 88.4 88.6 89.6 90.0  PLT 266 227 234 225   Cardiac Enzymes: No results for input(s): CKTOTAL, CKMB, CKMBINDEX, TROPONINI in the last 168 hours. BNP: BNP  (last 3 results) No results for input(s): BNP in the last 8760 hours.  ProBNP (last 3 results) No results for input(s): PROBNP in the last 8760 hours.  CBG: No results for input(s): GLUCAP in the last 168 hours.  SignedLouellen Molder  [facility] 08/21/2014, 10:42 AM

## 2014-08-22 LAB — URINE CULTURE
COLONY COUNT: NO GROWTH
Culture: NO GROWTH
SPECIAL REQUESTS: NORMAL

## 2014-08-25 ENCOUNTER — Encounter: Payer: Self-pay | Admitting: Family Medicine

## 2014-08-25 ENCOUNTER — Ambulatory Visit (INDEPENDENT_AMBULATORY_CARE_PROVIDER_SITE_OTHER): Payer: 59 | Admitting: Family Medicine

## 2014-08-25 ENCOUNTER — Ambulatory Visit (INDEPENDENT_AMBULATORY_CARE_PROVIDER_SITE_OTHER): Payer: 59

## 2014-08-25 VITALS — BP 124/78 | HR 85 | Temp 98.6°F | Resp 17 | Ht 61.0 in | Wt 160.0 lb

## 2014-08-25 DIAGNOSIS — R509 Fever, unspecified: Secondary | ICD-10-CM

## 2014-08-25 DIAGNOSIS — K51918 Ulcerative colitis, unspecified with other complication: Secondary | ICD-10-CM

## 2014-08-25 DIAGNOSIS — J189 Pneumonia, unspecified organism: Secondary | ICD-10-CM | POA: Diagnosis not present

## 2014-08-25 DIAGNOSIS — H6121 Impacted cerumen, right ear: Secondary | ICD-10-CM

## 2014-08-25 DIAGNOSIS — R05 Cough: Secondary | ICD-10-CM

## 2014-08-25 DIAGNOSIS — R059 Cough, unspecified: Secondary | ICD-10-CM

## 2014-08-25 LAB — POCT CBC
Granulocyte percent: 74.3 %G (ref 37–80)
HCT, POC: 45.5 % (ref 37.7–47.9)
Hemoglobin: 15.1 g/dL (ref 12.2–16.2)
Lymph, poc: 1.4 (ref 0.6–3.4)
MCH, POC: 28.8 pg (ref 27–31.2)
MCHC: 33.2 g/dL (ref 31.8–35.4)
MCV: 86.7 fL (ref 80–97)
MID (cbc): 0.6 (ref 0–0.9)
MPV: 6.8 fL (ref 0–99.8)
POC Granulocyte: 5.9 (ref 2–6.9)
POC LYMPH PERCENT: 17.8 %L (ref 10–50)
POC MID %: 7.9 %M (ref 0–12)
Platelet Count, POC: 438 10*3/uL — AB (ref 142–424)
RBC: 5.25 M/uL (ref 4.04–5.48)
RDW, POC: 12.9 %
WBC: 7.9 10*3/uL (ref 4.6–10.2)

## 2014-08-25 LAB — CULTURE, BLOOD (ROUTINE X 2)
Culture: NO GROWTH
Culture: NO GROWTH

## 2014-08-25 LAB — POCT SEDIMENTATION RATE: POCT SED RATE: 120 mm/hr — AB (ref 0–22)

## 2014-08-25 MED ORDER — CEFTRIAXONE SODIUM 1 G IJ SOLR
1.0000 g | Freq: Once | INTRAMUSCULAR | Status: AC
  Administered 2014-08-25: 1 g via INTRAMUSCULAR

## 2014-08-25 NOTE — Progress Notes (Signed)
Subjective:    Patient ID: Jessica Stephens, female    DOB: April 21, 1950, 64 y.o.   MRN: [id number]  This chart was scribed for Jessica Haber, MD, by Stephania Fragmin, ED Scribe. This patient was seen in room 10 and the patient's care was started at 9:34 AM.   HPI   Chief Complaint  Patient presents with   Fatigue   Anorexia   Cough   URI    HPI Comments: Jessica Stephens is a 64 y.o. female who presents to the [facility] complaining of fatigue and anorexia over the past week. Patient had recently been hospitalized 5/19-5/21 for left lower lobe pneumonia. She had initially gone to the ED 5/19 for generalized myalgias, severe chills, and a fever that was once measured at 100.9. She had just returned home from a cruise ship trip that stopped by [place] and [place] when these symptoms gradually onset 1 week ago. Patient was diagnosed with pneumonia after several diagnostic tests, given antibiotics. Patient was then discharged from the hospital because she was told her white count had gone down.   Patient has a history of ulcerative colitis for about the past 40 years, which she thinks may be exacerbated by the antibiotics given to her. She states she has had complete loss of appetite. She has only been able to consume tomato soup and hot tea. She denies diarrhea.   Patient still has a dry cough, and feels lightheaded and dizzy.  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   Review of Systems  Constitutional: Positive for chills, appetite change and fatigue.  Gastrointestinal: Negative for diarrhea.  Musculoskeletal: Positive for myalgias.  Neurological: Positive for dizziness and light-headedness.       Objective:   Physical Exam  Constitutional: She is oriented to person, place, and time. She appears well-developed and well-nourished. No distress.  HENT:  Head: Normocephalic and atraumatic.  Eyes: Conjunctivae and EOM are normal.  Neck: Neck supple. No  tracheal deviation present.  Cardiovascular: Normal rate.   Pulmonary/Chest: Effort normal. No respiratory distress. She has rales.  Few rales at the left base.   Musculoskeletal: Normal range of motion.  Neurological: She is alert and oriented to person, place, and time.  Skin: Skin is warm and dry.  Psychiatric: She has a normal mood and affect. Her behavior is normal.  Nursing note and vitals reviewed.  Results for orders placed or performed in visit on 08/25/14  POCT CBC  Result Value Ref Range   WBC 7.9 4.6 - 10.2 K/uL   Lymph, poc 1.4 0.6 - 3.4   POC LYMPH PERCENT 17.8 10 - 50 %L   MID (cbc) 0.6 0 - 0.9   POC MID % 7.9 0 - 12 %M   POC Granulocyte 5.9 2 - 6.9   Granulocyte percent 74.3 37 - 80 %G   RBC 5.25 4.04 - 5.48 M/uL   Hemoglobin 15.1 12.2 - 16.2 g/dL   HCT, POC 45.5 37.7 - 47.9 %   MCV 86.7 80 - 97 fL   MCH, POC 28.8 27 - 31.2 pg   MCHC 33.2 31.8 - 35.4 g/dL   RDW, POC 12.9 %   Platelet Count, POC 438 (A) 142 - 424 K/uL   MPV 6.8 0 - 99.8 fL   Primary X-Ray Reading by Dr. Joseph Art, at [facility]: Left lower lobe infiltrate, but there has been some interval clearing.     Assessment & Plan:   This  chart was scribed in my presence and reviewed by me personally.    ICD-9-CM ICD-10-CM   1. Cough 786.2 R05 POCT SEDIMENTATION RATE     POCT CBC     DG Chest 2 View     cefTRIAXone (ROCEPHIN) injection 1 g  2. Fever, unspecified fever cause 780.60 R50.9 POCT SEDIMENTATION RATE     POCT CBC     DG Chest 2 View  3. Ulcerative colitis, other complication 346.2 T94.712 POCT SEDIMENTATION RATE     POCT CBC     DG Chest 2 View  4. CAP (community acquired pneumonia) 486 J18.9 cefTRIAXone (ROCEPHIN) injection 1 g  5. Cerumen impaction, right 380.4 H61.21      Signed, Jessica Haber, MD

## 2014-08-25 NOTE — Patient Instructions (Signed)
There has been significant interval clearing on your chest x-ray, and her white blood count now is back to normal. 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  I will you to start on probiotics (align, culturelle) to help reestablish normal environment in your intestines. Please stay out of work until Monday, and treat your intestines kindly with chicken noodle soup, crackers, and perhaps scrambled eggs.  I would like you to return on Friday to listen to your lungs again and see how you're doing.

## 2014-08-26 LAB — CULTURE, BLOOD (ROUTINE X 2)
Culture: NO GROWTH
Culture: NO GROWTH

## 2014-08-27 ENCOUNTER — Ambulatory Visit (INDEPENDENT_AMBULATORY_CARE_PROVIDER_SITE_OTHER): Payer: 59 | Admitting: Family Medicine

## 2014-08-27 ENCOUNTER — Encounter: Payer: Self-pay | Admitting: Family Medicine

## 2014-08-27 VITALS — BP 120/60 | HR 84 | Temp 98.4°F | Resp 16 | Ht 61.0 in | Wt 160.2 lb

## 2014-08-27 DIAGNOSIS — J189 Pneumonia, unspecified organism: Secondary | ICD-10-CM

## 2014-08-27 DIAGNOSIS — K5190 Ulcerative colitis, unspecified, without complications: Secondary | ICD-10-CM | POA: Diagnosis not present

## 2014-08-27 MED ORDER — CEFTRIAXONE SODIUM 1 G IJ SOLR
1.0000 g | Freq: Once | INTRAMUSCULAR | Status: AC
  Administered 2014-08-27: 1 g via INTRAMUSCULAR

## 2014-08-27 NOTE — Progress Notes (Signed)
Subjective:  This chart was scribed for Robyn Haber MD, by Tamsen Roers, at [facility].  This patient was seen in room 1 and the patient's care was started at 10:05 AM.    Patient ID: Jessica Stephens, female    DOB: Aug 10, 1950, 64 y.o.   MRN: [id number] Chief Complaint  Patient presents with   Follow-up    follow up with Dr. Carlean Jews. for PNA.  still feeling weak but feeling some better    HPI  HPI Comments: Jessica Stephens is a 64 y.o. female who presents to the [facility] for a follow up regarding her pneumonia.  She states that she still has a productive cough and has been using pillows when laying down.  When she removed the pillows last night, she was coughing about every 10 minutes.  She is also feeling slight weakness and is sleeping often.  She is now eating better and was hungry for the first time yesterday (ate a sandwich).  Her bowel movements have also become more normal and she no longer has any diarrhea.  She used to see Dr. Sharlett Iles for her colitis. Patient takes her probiotics and is compliant with her Delzicol.  She takes Nexium as needed. She has no other questions or concerns today. Patient would like to go back to work on Monday.   Past Medical History  Diagnosis Date   Hypertension    Abnormal transaminases    Proctitis    Arthritis    Esophageal stricture    Hiatal hernia    Vitamin B12 deficiency    Anxiety    Ulcerative colitis     left sided   GERD (gastroesophageal reflux disease)    Maxillary sinusitis    Hyperlipidemia     Current Outpatient Prescriptions on File Prior to Visit  Medication Sig Dispense Refill   acetaminophen (TYLENOL) 500 MG tablet Take 1,000 mg by mouth every 6 (six) hours as needed (chills).     amLODipine (NORVASC) 10 MG tablet Take one tablet daily for blood pressure 90 tablet 1   cetirizine (ZYRTEC ALLERGY) 10 MG tablet Take 1 tablet (10 mg total) by mouth daily. 90  tablet 3   esomeprazole (NEXIUM) 20 MG capsule Take 20 mg by mouth daily as needed (indigestion).      losartan (COZAAR) 50 MG tablet Take 1 tablet (50 mg total) by mouth daily. 90 tablet 3   Mesalamine (DELZICOL) 400 MG CPDR DR capsule Take 1 capsule (400 mg total) by mouth 2 (two) times daily. One capsule daily 180 capsule 3   Multiple Vitamins-Minerals (VISION VITAMINS) TABS Take 1 tablet by mouth daily.     promethazine (PHENERGAN) 12.5 MG tablet Take 1 tablet (12.5 mg total) by mouth every 8 (eight) hours as needed for nausea or vomiting. 12 tablet 0   No current facility-administered medications on file prior to visit.    Allergies  Allergen Reactions   Alendronate Sodium    Sulfonamide Derivatives        Review of Systems  Constitutional: Positive for fatigue. Negative for fever and chills.  Respiratory: Positive for cough.   Gastrointestinal: Negative for nausea, vomiting and diarrhea.  Musculoskeletal: Negative for neck pain and neck stiffness.       Objective:   Physical Exam  Constitutional: She is oriented to person, place, and time. She appears well-developed and well-nourished. No distress.  HENT:  Head: Normocephalic and atraumatic.  Eyes: Conjunctivae and EOM  are normal.  Cardiovascular: Normal rate, regular rhythm and normal heart sounds.   No murmur heard. Pulmonary/Chest: Effort normal. No respiratory distress. She has rales.  Patient looks stronger, she is breathing easier.  She still has bibasilar rales.     Musculoskeletal: Normal range of motion.  Neurological: She is alert and oriented to person, place, and time.  Skin: Skin is warm and dry.  Psychiatric: She has a normal mood and affect. Her behavior is normal.  Nursing note and vitals reviewed.  Filed Vitals:   08/27/14 0916  BP: 120/60  Pulse: 84  Temp: 98.4 F (36.9 C)  TempSrc: Oral  Resp: 16  Height: 5' 1"  (1.549 m)  Weight: 160 lb 4 oz (72.689 kg)  SpO2: 96%   Results for  orders placed or performed in visit on 08/25/14  POCT SEDIMENTATION RATE  Result Value Ref Range   POCT SED RATE 120 (A) 0 - 22 mm/hr  POCT CBC  Result Value Ref Range   WBC 7.9 4.6 - 10.2 K/uL   Lymph, poc 1.4 0.6 - 3.4   POC LYMPH PERCENT 17.8 10 - 50 %L   MID (cbc) 0.6 0 - 0.9   POC MID % 7.9 0 - 12 %M   POC Granulocyte 5.9 2 - 6.9   Granulocyte percent 74.3 37 - 80 %G   RBC 5.25 4.04 - 5.48 M/uL   Hemoglobin 15.1 12.2 - 16.2 g/dL   HCT, POC 45.5 37.7 - 47.9 %   MCV 86.7 80 - 97 fL   MCH, POC 28.8 27 - 31.2 pg   MCHC 33.2 31.8 - 35.4 g/dL   RDW, POC 12.9 %   Platelet Count, POC 438 (A) 142 - 424 K/uL   MPV 6.8 0 - 99.8 fL      Assessment & Plan:    This chart was scribed in my presence and reviewed by me personally.    ICD-9-CM ICD-10-CM   1. CAP (community acquired pneumonia) 486 J18.9 cefTRIAXone (ROCEPHIN) injection 1 g  2. Ulcerative colitis, without complications 295.7 M73.40 cefTRIAXone (ROCEPHIN) injection 1 g   Patient is definitely better today. Her colitis seems to settle down, she is breathing better and has better color, and the rales are less apparent. Nevertheless with her high sedimentation rate, she needs one more follow-up in several days. I'm reluctant to give her any by mouth medicines because of the colitis and the problem she's had recently with estimated biotics.  I think she is going to need to see gastroenterologist and I'll leave that to Dr. Everlene Farrier to determine who might be the best fit. She did have a good relationship with Verl Blalock but he's retired.  Signed, Robyn Haber, MD

## 2014-08-27 NOTE — Patient Instructions (Signed)
Results for orders placed or performed in visit on 08/25/14  POCT SEDIMENTATION RATE  Result Value Ref Range   POCT SED RATE 120 (A) 0 - 22 mm/hr  POCT CBC  Result Value Ref Range   WBC 7.9 4.6 - 10.2 K/uL   Lymph, poc 1.4 0.6 - 3.4   POC LYMPH PERCENT 17.8 10 - 50 %L   MID (cbc) 0.6 0 - 0.9   POC MID % 7.9 0 - 12 %M   POC Granulocyte 5.9 2 - 6.9   Granulocyte percent 74.3 37 - 80 %G   RBC 5.25 4.04 - 5.48 M/uL   Hemoglobin 15.1 12.2 - 16.2 g/dL   HCT, POC 45.5 37.7 - 47.9 %   MCV 86.7 80 - 97 fL   MCH, POC 28.8 27 - 31.2 pg   MCHC 33.2 31.8 - 35.4 g/dL   RDW, POC 12.9 %   Platelet Count, POC 438 (A) 142 - 424 K/uL   MPV 6.8 0 - 99.8 fL   Use Afrin, one side of nasal passages per night, to open the sinuses  Dr. Everlene Farrier will be back on Monday.  Please follow up with him

## 2014-12-29 ENCOUNTER — Other Ambulatory Visit: Payer: Self-pay | Admitting: Family Medicine

## 2015-01-04 ENCOUNTER — Encounter: Payer: Self-pay | Admitting: Emergency Medicine

## 2015-02-08 ENCOUNTER — Ambulatory Visit (INDEPENDENT_AMBULATORY_CARE_PROVIDER_SITE_OTHER): Payer: 59 | Admitting: Emergency Medicine

## 2015-02-08 ENCOUNTER — Encounter: Payer: Self-pay | Admitting: Emergency Medicine

## 2015-02-08 VITALS — BP 130/80 | HR 79 | Temp 97.5°F | Resp 16 | Ht 61.0 in | Wt 162.0 lb

## 2015-02-08 DIAGNOSIS — Z0000 Encounter for general adult medical examination without abnormal findings: Secondary | ICD-10-CM

## 2015-02-08 DIAGNOSIS — K5100 Ulcerative (chronic) pancolitis without complications: Secondary | ICD-10-CM | POA: Diagnosis not present

## 2015-02-08 DIAGNOSIS — I10 Essential (primary) hypertension: Secondary | ICD-10-CM

## 2015-02-08 DIAGNOSIS — Z23 Encounter for immunization: Secondary | ICD-10-CM | POA: Diagnosis not present

## 2015-02-08 DIAGNOSIS — J301 Allergic rhinitis due to pollen: Secondary | ICD-10-CM | POA: Diagnosis not present

## 2015-02-08 DIAGNOSIS — K5190 Ulcerative colitis, unspecified, without complications: Secondary | ICD-10-CM

## 2015-02-08 LAB — CBC WITH DIFFERENTIAL/PLATELET
Basophils Absolute: 0.1 10*3/uL (ref 0.0–0.1)
Basophils Relative: 1 % (ref 0–1)
Eosinophils Absolute: 0.4 10*3/uL (ref 0.0–0.7)
Eosinophils Relative: 6 % — ABNORMAL HIGH (ref 0–5)
HEMATOCRIT: 42 % (ref 36.0–46.0)
Hemoglobin: 14.6 g/dL (ref 12.0–15.0)
LYMPHS PCT: 24 % (ref 12–46)
Lymphs Abs: 1.5 10*3/uL (ref 0.7–4.0)
MCH: 29.6 pg (ref 26.0–34.0)
MCHC: 34.8 g/dL (ref 30.0–36.0)
MCV: 85.2 fL (ref 78.0–100.0)
MONO ABS: 0.5 10*3/uL (ref 0.1–1.0)
MPV: 9.4 fL (ref 8.6–12.4)
Monocytes Relative: 8 % (ref 3–12)
NEUTROS ABS: 3.7 10*3/uL (ref 1.7–7.7)
NEUTROS PCT: 61 % (ref 43–77)
Platelets: 298 10*3/uL (ref 150–400)
RBC: 4.93 MIL/uL (ref 3.87–5.11)
RDW: 12.8 % (ref 11.5–15.5)
WBC: 6.1 10*3/uL (ref 4.0–10.5)

## 2015-02-08 LAB — COMPLETE METABOLIC PANEL WITH GFR
ALBUMIN: 4.3 g/dL (ref 3.6–5.1)
ALK PHOS: 119 U/L (ref 33–130)
ALT: 29 U/L (ref 6–29)
AST: 20 U/L (ref 10–35)
BUN: 9 mg/dL (ref 7–25)
CO2: 25 mmol/L (ref 20–31)
CREATININE: 0.66 mg/dL (ref 0.50–0.99)
Calcium: 9 mg/dL (ref 8.6–10.4)
Chloride: 103 mmol/L (ref 98–110)
GFR, Est African American: >89 mL/min (ref >=60)
GFR, Est Non African American: >89 mL/min (ref >=60)
GLUCOSE: 89 mg/dL (ref 65–99)
POTASSIUM: 4 mmol/L (ref 3.5–5.3)
SODIUM: 141 mmol/L (ref 135–146)
Total Bilirubin: 0.8 mg/dL (ref 0.2–1.2)
Total Protein: 7.1 g/dL (ref 6.1–8.1)

## 2015-02-08 LAB — LIPID PANEL
CHOL/HDL RATIO: 4 ratio (ref <=5.0)
Cholesterol: 223 mg/dL — ABNORMAL HIGH (ref 125–200)
HDL: 56 mg/dL (ref >=46)
LDL Cholesterol: 140 mg/dL — ABNORMAL HIGH (ref <130)
Triglycerides: 134 mg/dL (ref <150)
VLDL: 27 mg/dL (ref <30)

## 2015-02-08 LAB — VITAMIN D 25 HYDROXY (VIT D DEFICIENCY, FRACTURES): VIT D 25 HYDROXY: 33 ng/mL (ref 30–100)

## 2015-02-08 LAB — FERRITIN: Ferritin: 176 ng/mL (ref 10–291)

## 2015-02-08 LAB — VITAMIN B12: VITAMIN B 12: 572 pg/mL (ref 211–911)

## 2015-02-08 LAB — MAGNESIUM: MAGNESIUM: 2.1 mg/dL (ref 1.5–2.5)

## 2015-02-08 MED ORDER — AMLODIPINE BESYLATE 10 MG PO TABS: ORAL_TABLET | ORAL | Status: DC

## 2015-02-08 MED ORDER — MESALAMINE 400 MG PO CPDR: 400.0000 mg | DELAYED_RELEASE_CAPSULE | Freq: Two times a day (BID) | ORAL | Status: DC

## 2015-02-08 MED ORDER — FLUTICASONE PROPIONATE 50 MCG/ACT NA SUSP: 2.0000 | Freq: Every day | NASAL | Status: DC

## 2015-02-08 MED ORDER — LOSARTAN POTASSIUM 50 MG PO TABS: 50.0000 mg | ORAL_TABLET | Freq: Every day | ORAL | Status: DC

## 2015-02-08 NOTE — Progress Notes (Signed)
Patient ID: HAIVEN Jessica Stephens, female   DOB: Jan 17, 1951, 64 y.o.   MRN: [id number]     This chart was scribed for Jessica Queen, MD by Zola Button, Medical Scribe. This patient was seen in room 21 and the patient's care was started at 9:46 AM.   Chief Complaint:  Chief Complaint  Patient presents with  . Annual Exam    needs note for shoes  . Referral    colonoscopy    HPI: Jessica Stephens is a 64 y.o. female with a history of ulcerative colitis, GERD and hypertension who reports to [facility] today for a complete physical exam. She did take her blood pressure medications this morning.  Finger pain: Patient has had some pain in her left 3rd finger due to an injury involving a shattered ornament. She has some associated numbness. She did have some bleeding during the injury and did attempt to remove the shards.  Cancer screening: She had a Gyn exam 2 years ago and had her pap smear done then. She will not be due until January, 2018. She had been seeing Dr. Sharlett Iles for colonoscopy but needs a referral for another doctor; she would like to see Dr. Collene Mares. Patient notes she has had some abdominal discomfort.  Patient works at [employer] and [employer]. Unfortunately she will be working at [employer] on Thanksgiving at [employer].  Past Medical History  Diagnosis Date  . Hypertension   . Abnormal transaminases   . Proctitis   . Arthritis   . Esophageal stricture   . Hiatal hernia   . Vitamin B12 deficiency   . Anxiety   . Ulcerative colitis ([place])     left sided  . GERD (gastroesophageal reflux disease)   . Maxillary sinusitis   . Hyperlipidemia    Past Surgical History  Procedure Laterality Date  . Cesarean section    . Nose surg    . Nose surgery     Social History   Social History  . Marital Status: Married    Spouse Name: N/A  . Number of Children: N/A  . Years of Education: N/A   Social History Main Topics  . Smoking status: Never Smoker   . Smokeless tobacco: Never Used  . Alcohol Use: No    . Drug Use: No  . Sexual Activity: Yes    Birth Control/ Protection: None   Other Topics Concern  . None   Social History Narrative   Family History  Problem Relation Age of Onset  . Arthritis Mother   . Dementia Mother   . Diabetes Father   . Hyperlipidemia Father   . Hypertension Father    Allergies  Allergen Reactions  . Alendronate Sodium   . Sulfonamide Derivatives    Prior to Admission medications   Medication Sig Start Date End Date Taking? Authorizing Provider  acetaminophen (TYLENOL) 500 MG tablet Take 1,000 mg by mouth every 6 (six) hours as needed (chills).    Historical Provider, MD  amLODipine (NORVASC) 10 MG tablet TAKE ONE TABLET DAILY FOR BLOOD PRESSURE. 12/29/14   Mancel Bale, PA-C  cetirizine (ZYRTEC ALLERGY) 10 MG tablet Take 1 tablet (10 mg total) by mouth daily. 01/27/14   Robyn Haber, MD  esomeprazole (NEXIUM) 20 MG capsule Take 20 mg by mouth daily as needed (indigestion).     Historical Provider, MD  losartan (COZAAR) 50 MG tablet Take 1 tablet (50 mg total) by mouth daily. 01/27/14   Robyn Haber, MD  Mesalamine (DELZICOL) 400 MG  CPDR DR capsule Take 1 capsule (400 mg total) by mouth 2 (two) times daily. One capsule daily 01/27/14   Robyn Haber, MD  Multiple Vitamins-Minerals (VISION VITAMINS) TABS Take 1 tablet by mouth daily.    Historical Provider, MD  promethazine (PHENERGAN) 12.5 MG tablet Take 1 tablet (12.5 mg total) by mouth every 8 (eight) hours as needed for nausea or vomiting. 08/21/14   Nishant Dhungel, MD     ROS: The patient denies fevers, chills, night sweats, unintentional weight loss, chest pain, palpitations, wheezing, dyspnea on exertion, dysuria, hematuria, melena, weakness.  All other systems have been reviewed and were otherwise negative with the exception of those mentioned in the HPI and as above.    PHYSICAL EXAM: Filed Vitals:   02/08/15 0928  BP: 149/81  Pulse: 79  Temp: 97.5 F (36.4 C)  Resp: 16   Body  mass index is 30.63 kg/(m^2).   General: Alert, no acute distress HEENT:  Normocephalic, atraumatic, oropharynx patent. Nasal congestion. Eye: Juliette Mangle [facility] Cardiovascular:  Regular rate and rhythm, no rubs murmurs or gallops.  No Carotid bruits, radial pulse intact. No pedal edema.  Respiratory: Clear to auscultation bilaterally.  No wheezes, rales, or rhonchi.  No cyanosis, no use of accessory musculature Abdominal: No organomegaly, abdomen is soft and non-tender, positive bowel sounds.  No masses. Musculoskeletal: Gait intact. No extremity edema. Skin: No rashes. Neurologic: Facial musculature symmetric. Psychiatric: Patient acts appropriately throughout our interaction. Lymphatic: No cervical or submandibular lymphadenopathy Genitourinary: Breast with no masses. Pelvic and rectal exam deferred.   LABS:    EKG/XRAY:   Primary read interpreted by [facility]. Everlene Farrier at [facility].   ASSESSMENT/PLAN: 1. Annual physical exam  - Lipid panel  2. Need for prophylactic vaccination and inoculation against influenza  - Flu Vaccine QUAD 36+ mos IM  3. Ulcerative colitis, without complications  - Ambulatory referral to Gastroenterology - CBC with Differential/Platelet - COMPLETE METABOLIC PANEL WITH GFR - Lipid panel - Magnesium - Vitamin B12 - Vit D  25 hydroxy (rtn osteoporosis monitoring) - Ferritin - Mesalamine (DELZICOL) 400 MG CPDR DR capsule; Take 1 capsule (400 mg total) by mouth 2 (two) times daily. One capsule daily  Dispense: 180 capsule; Refill: 3  4. Allergic rhinitis due to pollen  - fluticasone (FLONASE) 50 MCG/ACT nasal spray; Place 2 sprays into both nostrils daily.  Dispense: 16 g; Refill: 6  5. Ulcerative pancolitis without complication ([place])  - Ambulatory referral to Gastroenterology - CBC with Differential/Platelet - COMPLETE METABOLIC PANEL WITH GFR - Lipid panel - Magnesium - Vitamin B12 - Vit D  25 hydroxy (rtn osteoporosis monitoring) - Ferritin  6.  Essential hypertension Blood pressure goal 130/80. - losartan (COZAAR) 50 MG tablet; Take 1 tablet (50 mg total) by mouth daily.  Dispense: 90 tablet; Refill: 3 - amLODipine (NORVASC) 10 MG tablet; TAKE ONE TABLET DAILY FOR BLOOD PRESSURE.  Dispense: 90 tablet; Refill: 3   I personally performed the services described in this documentation, which was scribed in my presence. The recorded information has been reviewed and is accurate.  Jessica Queen, MD  [facility]  02/08/2015 12:53 PM  By signing my name below, I, Zola Button, attest that this documentation has been prepared under the direction and in the presence of Jessica Queen, MD.  Electronically Signed: Zola Button, Medical Scribe. 02/08/2015. 9:46 AM.   [place]oh[place] sideeffects, risk and benefits, and alternatives of medications d/w patient. Patient is aware that all medications  have potential sideeffects and we are unable to predict every sideeffect or drug-drug interaction that may occur.  Jessica Queen MD 02/08/2015 9:46 AM

## 2015-02-10 ENCOUNTER — Other Ambulatory Visit: Payer: Self-pay

## 2015-02-10 MED ORDER — ATORVASTATIN CALCIUM 20 MG PO TABS: 20.0000 mg | ORAL_TABLET | Freq: Every day | ORAL | Status: DC

## 2015-02-14 ENCOUNTER — Encounter: Payer: Self-pay | Admitting: Gastroenterology

## 2015-02-15 ENCOUNTER — Other Ambulatory Visit: Payer: Self-pay | Admitting: Gastroenterology

## 2015-02-15 DIAGNOSIS — R1013 Epigastric pain: Secondary | ICD-10-CM

## 2015-02-23 ENCOUNTER — Ambulatory Visit (INDEPENDENT_AMBULATORY_CARE_PROVIDER_SITE_OTHER): Payer: 59 | Admitting: Emergency Medicine

## 2015-02-23 ENCOUNTER — Telehealth: Payer: Self-pay

## 2015-02-23 VITALS — BP 132/80 | HR 78 | Temp 98.0°F | Resp 16 | Ht 61.0 in | Wt 163.0 lb

## 2015-02-23 DIAGNOSIS — R509 Fever, unspecified: Secondary | ICD-10-CM | POA: Diagnosis not present

## 2015-02-23 DIAGNOSIS — J029 Acute pharyngitis, unspecified: Secondary | ICD-10-CM | POA: Diagnosis not present

## 2015-02-23 DIAGNOSIS — R05 Cough: Secondary | ICD-10-CM

## 2015-02-23 DIAGNOSIS — R059 Cough, unspecified: Secondary | ICD-10-CM

## 2015-02-23 LAB — POCT CBC
Granulocyte percent: 67.6 %G (ref 37–80)
HCT, POC: 40.9 % (ref 37.7–47.9)
Hemoglobin: 14.2 g/dL (ref 12.2–16.2)
Lymph, poc: 1 (ref 0.6–3.4)
MCH: 29.8 pg (ref 27–31.2)
MCHC: 34.8 g/dL (ref 31.8–35.4)
MCV: 85.6 fL (ref 80–97)
MID (cbc): 0.8 (ref 0–0.9)
MPV: 6.7 fL (ref 0–99.8)
PLATELET COUNT, POC: 307 10*3/uL (ref 142–424)
POC Granulocyte: 3.7 (ref 2–6.9)
POC LYMPH %: 18.7 % (ref 10–50)
POC MID %: 13.7 %M — AB (ref 0–12)
RBC: 4.78 M/uL (ref 4.04–5.48)
RDW, POC: 13 %
WBC: 5.5 10*3/uL (ref 4.6–10.2)

## 2015-02-23 LAB — POCT INFLUENZA A/B
INFLUENZA A, POC: NEGATIVE
Influenza B, POC: NEGATIVE

## 2015-02-23 LAB — POCT RAPID STREP A (OFFICE): RAPID STREP A SCREEN: NEGATIVE

## 2015-02-23 MED ORDER — DM-GUAIFENESIN ER 30-600 MG PO TB12: 1.0000 | ORAL_TABLET | Freq: Two times a day (BID) | ORAL | Status: DC

## 2015-02-23 MED ORDER — PREDNISONE 20 MG PO TABS: ORAL_TABLET | ORAL | Status: DC

## 2015-02-23 MED ORDER — HYDROCODONE-HOMATROPINE 5-1.5 MG/5ML PO SYRP: 5.0000 mL | ORAL_SOLUTION | Freq: Three times a day (TID) | ORAL | Status: DC | PRN

## 2015-02-23 MED ORDER — BENZONATATE 100 MG PO CAPS: 100.0000 mg | ORAL_CAPSULE | Freq: Three times a day (TID) | ORAL | Status: DC | PRN

## 2015-02-23 NOTE — Patient Instructions (Signed)
Upper Respiratory Infection, Adult Most upper respiratory infections (URIs) are a viral infection of the air passages leading to the lungs. A URI affects the nose, throat, and upper air passages. The most common type of URI is nasopharyngitis and is typically referred to as "the common cold." URIs run their course and usually go away on their own. Most of the time, a URI does not require medical attention, but sometimes a bacterial infection in the upper airways can follow a viral infection. This is called a secondary infection. Sinus and middle ear infections are common types of secondary upper respiratory infections. Bacterial pneumonia can also complicate a URI. A URI can worsen asthma and chronic obstructive pulmonary disease (COPD). Sometimes, these complications can require emergency medical care and may be life threatening.  CAUSES Almost all URIs are caused by viruses. A virus is a type of germ and can spread from one person to another.  RISKS FACTORS You may be at risk for a URI if:   You smoke.   You have chronic heart or lung disease.  You have a weakened defense (immune) system.   You are very young or very old.   You have nasal allergies or asthma.  You work in crowded or poorly ventilated areas.  You work in health care facilities or schools. SIGNS AND SYMPTOMS  Symptoms typically develop 2-3 days after you come in contact with a cold virus. Most viral URIs last 7-10 days. However, viral URIs from the influenza virus (flu virus) can last 14-18 days and are typically more severe. Symptoms may include:   Runny or stuffy (congested) nose.   Sneezing.   Cough.   Sore throat.   Headache.   Fatigue.   Fever.   Loss of appetite.   Pain in your forehead, behind your eyes, and over your cheekbones (sinus pain).  Muscle aches.  DIAGNOSIS  Your health care provider may diagnose a URI by:  Physical exam.  Tests to check that your symptoms are not due to  another condition such as:  Strep throat.  Sinusitis.  Pneumonia.  Asthma. TREATMENT  A URI goes away on its own with time. It cannot be cured with medicines, but medicines may be prescribed or recommended to relieve symptoms. Medicines may help:  Reduce your fever.  Reduce your cough.  Relieve nasal congestion. HOME CARE INSTRUCTIONS   Take medicines only as directed by your health care provider.   Gargle warm saltwater or take cough drops to comfort your throat as directed by your health care provider.  Use a warm mist humidifier or inhale steam from a shower to increase air moisture. This may make it easier to breathe.  Drink enough fluid to keep your urine clear or pale yellow.   Eat soups and other clear broths and maintain good nutrition.   Rest as needed.   Return to work when your temperature has returned to normal or as your health care provider advises. You may need to stay home longer to avoid infecting others. You can also use a face mask and careful hand washing to prevent spread of the virus.  Increase the usage of your inhaler if you have asthma.   Do not use any tobacco products, including cigarettes, chewing tobacco, or electronic cigarettes. If you need help quitting, ask your health care provider. PREVENTION  The best way to protect yourself from getting a cold is to practice good hygiene.   Avoid oral or hand contact with people with cold   symptoms.   Wash your hands often if contact occurs.  There is no clear evidence that vitamin C, vitamin E, echinacea, or exercise reduces the chance of developing a cold. However, it is always recommended to get plenty of rest, exercise, and practice good nutrition.  SEEK MEDICAL CARE IF:   You are getting worse rather than better.   Your symptoms are not controlled by medicine.   You have chills.  You have worsening shortness of breath.  You have brown or red mucus.  You have yellow or brown nasal  discharge.  You have pain in your face, especially when you bend forward.  You have a fever.  You have swollen neck glands.  You have pain while swallowing.  You have white areas in the back of your throat. SEEK IMMEDIATE MEDICAL CARE IF:   You have severe or persistent:  Headache.  Ear pain.  Sinus pain.  Chest pain.  You have chronic lung disease and any of the following:  Wheezing.  Prolonged cough.  Coughing up blood.  A change in your usual mucus.  You have a stiff neck.  You have changes in your:  Vision.  Hearing.  Thinking.  Mood. MAKE SURE YOU:   Understand these instructions.  Will watch your condition.  Will get help right away if you are not doing well or get worse.   This information is not intended to replace advice given to you by your health care provider. Make sure you discuss any questions you have with your health care provider.   Document Released: 09/12/2000 Document Revised: 08/03/2014 Document Reviewed: 06/24/2013 Elsevier Interactive Patient Education 2016 [employer]  

## 2015-02-23 NOTE — Progress Notes (Signed)
Patient ID: Jessica Stephens, female   DOB: 1950-08-27, 64 y.o.   MRN: [id number]     This chart was scribed for Arlyss Queen, MD by Zola Button, Medical Scribe. This patient was seen in room 12 and the patient's care was started at 8:36 AM.   Chief Complaint:  Chief Complaint  Patient presents with  . Wheezing  . Cough  . Generalized Body Aches  . Sore Throat  . ear pain    both ears    HPI: Jessica Stephens is a 64 y.o. female who reports to [facility] today complaining of gradual onset, worsening cough that started 3 days ago. Her symptoms began with chills 3 days ago, then began having cough later that day. The following day, she developed generalized myalgias, headache, congestion, bilateral ear pain, wheezing, subjective fever, and diaphoresis. The cough is not productive but her chest does feel congested. Her headache was resolved with Tylenol. She has also been taking Mucinex D for her symptoms.  Past Medical History  Diagnosis Date  . Hypertension   . Abnormal transaminases   . Proctitis   . Arthritis   . Esophageal stricture   . Hiatal hernia   . Vitamin B12 deficiency   . Anxiety   . Ulcerative colitis ([place])     left sided  . GERD (gastroesophageal reflux disease)   . Maxillary sinusitis   . Hyperlipidemia    Past Surgical History  Procedure Laterality Date  . Cesarean section    . Nose surg    . Nose surgery     Social History   Social History  . Marital Status: Married    Spouse Name: N/A  . Number of Children: N/A  . Years of Education: N/A   Social History Main Topics  . Smoking status: Never Smoker   . Smokeless tobacco: Never Used  . Alcohol Use: No  . Drug Use: No  . Sexual Activity: Yes    Birth Control/ Protection: None   Other Topics Concern  . None   Social History Narrative   Family History  Problem Relation Age of Onset  . Arthritis Mother   . Dementia Mother   . Diabetes Father   . Hyperlipidemia Father   . Hypertension Father     Allergies  Allergen Reactions  . Alendronate Sodium   . Sulfonamide Derivatives    Prior to Admission medications   Medication Sig Start Date End Date Taking? Authorizing Provider  acetaminophen (TYLENOL) 500 MG tablet Take 1,000 mg by mouth every 6 (six) hours as needed (chills).   Yes Historical Provider, MD  amLODipine (NORVASC) 10 MG tablet TAKE ONE TABLET DAILY FOR BLOOD PRESSURE. 02/08/15  Yes Darlyne Russian, MD  atorvastatin (LIPITOR) 20 MG tablet Take 1 tablet (20 mg total) by mouth daily. 02/10/15  Yes Darlyne Russian, MD  cetirizine (ZYRTEC ALLERGY) 10 MG tablet Take 1 tablet (10 mg total) by mouth daily. 01/27/14  Yes Robyn Haber, MD  esomeprazole (NEXIUM) 20 MG capsule Take 20 mg by mouth daily as needed (indigestion).    Yes Historical Provider, MD  fluticasone (FLONASE) 50 MCG/ACT nasal spray Place 2 sprays into both nostrils daily. 02/08/15  Yes Darlyne Russian, MD  losartan (COZAAR) 50 MG tablet Take 1 tablet (50 mg total) by mouth daily. 02/08/15  Yes Darlyne Russian, MD  Mesalamine (DELZICOL) 400 MG CPDR DR capsule Take 1 capsule (400 mg total) by mouth 2 (two) times daily. One capsule daily 02/08/15  Yes Darlyne Russian, MD  Multiple Vitamins-Minerals (VISION VITAMINS) TABS Take 1 tablet by mouth daily.   Yes Historical Provider, MD  promethazine (PHENERGAN) 12.5 MG tablet Take 1 tablet (12.5 mg total) by mouth every 8 (eight) hours as needed for nausea or vomiting. Patient not taking: Reported on 02/23/2015 08/21/14   Flonnie Overman Dhungel, MD     ROS: The patient denies unintentional weight loss, chest pain, palpitations, wheezing, dyspnea on exertion, nausea, vomiting, abdominal pain, dysuria, hematuria, melena, numbness, weakness, or tingling.   All other systems have been reviewed and were otherwise negative with the exception of those mentioned in the HPI and as above.    PHYSICAL EXAM: Filed Vitals:   02/23/15 0831  BP: 132/80  Pulse: 78  Temp: 98 F (36.7 C)  Resp: 16    Body mass index is 30.81 kg/(m^2).   General: Alert, no acute distress  EYE pupils equal round regular react to light HEENT TMs are clear there is significant redness on the left nasopharynx. Throat is red.. Cardiovascular:  Regular rate and rhythm, no rubs murmurs or gallops.  No Carotid bruits, radial pulse intact. No pedal edema.  Respiratory: Clear to auscultation bilaterally. No cyanosis, no use of accessory musculature patient has a high-pitched cough Abdominal: No organomegaly, abdomen is soft and non-tender, positive bowel sounds.  No masses. Musculoskeletal: Gait intact. No edema, tenderness Skin: No rashes. Neurologic: Facial musculature symmetric. Psychiatric: Patient acts appropriately throughout our interaction. Lymphatic: No cervical or submandibular lymphadenopathy    LABS: Results for orders placed or performed in visit on 02/23/15  POCT rapid strep A  Result Value Ref Range   Rapid Strep A Screen Negative Negative  POCT Influenza A/B  Result Value Ref Range   Influenza A, POC Negative Negative   Influenza B, POC Negative Negative  POCT CBC  Result Value Ref Range   WBC 5.5 4.6 - 10.2 K/uL   Lymph, poc 1.0 0.6 - 3.4   POC LYMPH PERCENT 18.7 10 - 50 %L   MID (cbc) 0.8 0 - 0.9   POC MID % 13.7 (A) 0 - 12 %M   POC Granulocyte 3.7 2 - 6.9   Granulocyte percent 67.6 37 - 80 %G   RBC 4.78 4.04 - 5.48 M/uL   Hemoglobin 14.2 12.2 - 16.2 g/dL   HCT, POC 40.9 37.7 - 47.9 %   MCV 85.6 80 - 97 fL   MCH, POC 29.8 27 - 31.2 pg   MCHC 34.8 31.8 - 35.4 g/dL   RDW, POC 13.0 %   Platelet Count, POC 307 142 - 424 K/uL   MPV 6.7 0 - 99.8 fL   Meds ordered this encounter  Medications  . predniSONE (DELTASONE) 20 MG tablet    Sig: Take 2 tablets daily    Dispense:  10 tablet    Refill:  0  . HYDROcodone-homatropine (HYCODAN) 5-1.5 MG/5ML syrup    Sig: Take 5 mLs by mouth every 8 (eight) hours as needed for cough.    Dispense:  120 mL    Refill:  0    EKG/XRAY:    Primary read interpreted by Dr. [facility]lene Farrier at [facility].   ASSESSMENT/PLAN: Patient has a viral upper respiratory infection. She has been prone to sinusitis in the past. Will treat symptomatically with Tessalon Perles, Hycodan, Mucinex D, and 5 days of prednisone.I personally performed the services described in this documentation, which was scribed in my presence. The recorded information has been reviewed and is accurate.  By signing my name below, I, Zola Button, attest that this documentation has been prepared under the direction and in the presence of Arlyss Queen, MD.  Electronically Signed: Zola Button, Medical Scribe. 02/23/2015. 8:36 AM.   Johney [place] sideeffects, risk and benefits, and alternatives of medications d/w patient. Patient is aware that all medications have potential sideeffects and we are unable to predict every sideeffect or drug-drug interaction that may occur.  Arlyss Queen MD 02/23/2015 8:36 AM

## 2015-02-23 NOTE — Telephone Encounter (Signed)
Called pt for Dr. Everlene Farrier. Dr. Everlene Farrier just wanted to make sure that pt did not call after visit today and needed anything.

## 2015-02-26 ENCOUNTER — Telehealth: Payer: Self-pay

## 2015-02-26 NOTE — Telephone Encounter (Signed)
Message left to call back in AM.

## 2015-02-26 NOTE — Telephone Encounter (Signed)
Patient called in stating that she saw Dr. Everlene Farrier on Wednesday and she has not gotten any better and she needs something to help her get "this mess out of her" she states she is coughing but not producing anything and she need something to help with that, she also states that she doesn't want to take the cough medicine that was Rx to her because she feels it is dangerous. Can someone please call her back and talk to her about this and maybe see about getting her something else, she states she can not work because of coughing to much.   She uses the [facility] on [address] and her call back number is [phone] .

## 2015-02-27 ENCOUNTER — Telehealth: Payer: Self-pay | Admitting: Emergency Medicine

## 2015-02-27 NOTE — Telephone Encounter (Signed)
Called and spoke with patient last night. She continues to have a cough. She has clear rhinorrhea. She did not take her hydrocodone cough syrup because she was afraid of the side effects of drowsiness. I encouraged her to continue her current treatment program. She can take Delsym at night. She can take a low dose of Sudafed in the morning. She will also take Mucinex twice a day. She finishes her prednisone today.

## 2015-03-07 LAB — HM COLONOSCOPY

## 2015-03-14 ENCOUNTER — Encounter (HOSPITAL_COMMUNITY): Payer: 59

## 2015-03-14 ENCOUNTER — Ambulatory Visit (HOSPITAL_COMMUNITY): Payer: 59

## 2015-04-05 ENCOUNTER — Encounter: Payer: 59 | Admitting: Emergency Medicine

## 2015-04-06 ENCOUNTER — Encounter: Payer: Self-pay | Admitting: Family Medicine

## 2015-06-26 ENCOUNTER — Emergency Department (HOSPITAL_COMMUNITY): Payer: 59

## 2015-06-26 ENCOUNTER — Encounter (HOSPITAL_COMMUNITY): Payer: Self-pay | Admitting: Emergency Medicine

## 2015-06-26 ENCOUNTER — Emergency Department (HOSPITAL_COMMUNITY)
Admission: EM | Admit: 2015-06-26 | Discharge: 2015-06-26 | Disposition: A | Discharge status: Home / Self Care | Payer: 59 | Attending: Emergency Medicine | Admitting: Emergency Medicine

## 2015-06-26 DIAGNOSIS — Z7951 Long term (current) use of inhaled steroids: Secondary | ICD-10-CM | POA: Diagnosis not present

## 2015-06-26 DIAGNOSIS — W010XXA Fall on same level from slipping, tripping and stumbling without subsequent striking against object, initial encounter: Secondary | ICD-10-CM | POA: Diagnosis not present

## 2015-06-26 DIAGNOSIS — K219 Gastro-esophageal reflux disease without esophagitis: Secondary | ICD-10-CM | POA: Diagnosis not present

## 2015-06-26 DIAGNOSIS — E785 Hyperlipidemia, unspecified: Secondary | ICD-10-CM | POA: Diagnosis not present

## 2015-06-26 DIAGNOSIS — Z79899 Other long term (current) drug therapy: Secondary | ICD-10-CM | POA: Diagnosis not present

## 2015-06-26 DIAGNOSIS — Y92009 Unspecified place in unspecified non-institutional (private) residence as the place of occurrence of the external cause: Secondary | ICD-10-CM | POA: Diagnosis not present

## 2015-06-26 DIAGNOSIS — S4991XA Unspecified injury of right shoulder and upper arm, initial encounter: Secondary | ICD-10-CM | POA: Diagnosis not present

## 2015-06-26 DIAGNOSIS — Y998 Other external cause status: Secondary | ICD-10-CM | POA: Diagnosis not present

## 2015-06-26 DIAGNOSIS — I10 Essential (primary) hypertension: Secondary | ICD-10-CM | POA: Insufficient documentation

## 2015-06-26 DIAGNOSIS — Z8659 Personal history of other mental and behavioral disorders: Secondary | ICD-10-CM | POA: Insufficient documentation

## 2015-06-26 DIAGNOSIS — Y9301 Activity, walking, marching and hiking: Secondary | ICD-10-CM | POA: Insufficient documentation

## 2015-06-26 DIAGNOSIS — M25511 Pain in right shoulder: Secondary | ICD-10-CM

## 2015-06-26 DIAGNOSIS — M1990 Unspecified osteoarthritis, unspecified site: Secondary | ICD-10-CM | POA: Diagnosis not present

## 2015-06-26 DIAGNOSIS — Z7952 Long term (current) use of systemic steroids: Secondary | ICD-10-CM | POA: Diagnosis not present

## 2015-06-26 DIAGNOSIS — W19XXXA Unspecified fall, initial encounter: Secondary | ICD-10-CM

## 2015-06-26 MED ORDER — IBUPROFEN 600 MG PO TABS: 600.0000 mg | ORAL_TABLET | Freq: Three times a day (TID) | ORAL | Status: DC

## 2015-06-26 MED ORDER — HYDROCODONE-ACETAMINOPHEN 5-325 MG PO TABS: 1.0000 | ORAL_TABLET | ORAL | Status: DC | PRN

## 2015-06-26 NOTE — ED Provider Notes (Signed)
CSN: [id number]     Arrival date & time 06/26/15  1937 History   First MD Initiated Contact with Patient 06/26/15 2015     Chief Complaint  Patient presents with  . Shoulder Injury  . Fall     (Consider location/radiation/quality/duration/timing/severity/associated sxs/prior Treatment) The history is provided by medical records and the patient.    65 y.o. F with hx of HTN, vit b12 def, GERD, UC, HLP, presenting to the ED for a fall.  Patient reports she was walking through her house today and went to turn the corner and tripped over a basket.  She fell onto her right shoulder, no head injury or LOC.  She states she felt a "pop" in the right shoulder at time of fall, was concerned shoulder may have popped out of place.  Denies numbness or weakness of right arm.  Patient is left hand dominant.  VSS.  Past Medical History  Diagnosis Date  . Hypertension   . Abnormal transaminases   . Proctitis   . Arthritis   . Esophageal stricture   . Hiatal hernia   . Vitamin B12 deficiency   . Anxiety   . Ulcerative colitis ([place])     left sided  . GERD (gastroesophageal reflux disease)   . Maxillary sinusitis   . Hyperlipidemia    Past Surgical History  Procedure Laterality Date  . Cesarean section    . Nose surg    . Nose surgery     Family History  Problem Relation Age of Onset  . Arthritis Mother   . Dementia Mother   . Diabetes Father   . Hyperlipidemia Father   . Hypertension Father    Social History  Substance Use Topics  . Smoking status: Never Smoker   . Smokeless tobacco: Never Used  . Alcohol Use: No   OB History    No data available     Review of Systems  Musculoskeletal: Positive for arthralgias.  All other systems reviewed and are negative.     Allergies  Alendronate sodium and Sulfonamide derivatives  Home Medications   Prior to Admission medications   Medication Sig Start Date End Date Taking? Authorizing Provider  acetaminophen (TYLENOL) 500 MG  tablet Take 1,000 mg by mouth every 6 (six) hours as needed (chills).    Historical Provider, MD  amLODipine (NORVASC) 10 MG tablet TAKE ONE TABLET DAILY FOR BLOOD PRESSURE. 02/08/15   Darlyne Russian, MD  atorvastatin (LIPITOR) 20 MG tablet Take 1 tablet (20 mg total) by mouth daily. 02/10/15   Darlyne Russian, MD  benzonatate (TESSALON) 100 MG capsule Take 1-2 capsules (100-200 mg total) by mouth 3 (three) times daily as needed for cough. 02/23/15   Darlyne Russian, MD  cetirizine (ZYRTEC ALLERGY) 10 MG tablet Take 1 tablet (10 mg total) by mouth daily. 01/27/14   Robyn Haber, MD  dextromethorphan-guaiFENesin [facility] DM) 30-600 MG 12hr tablet Take 1 tablet by mouth 2 (two) times daily. 02/23/15   Darlyne Russian, MD  esomeprazole (NEXIUM) 20 MG capsule Take 20 mg by mouth daily as needed (indigestion).     Historical Provider, MD  fluticasone (FLONASE) 50 MCG/ACT nasal spray Place 2 sprays into both nostrils daily. 02/08/15   Darlyne Russian, MD  HYDROcodone-homatropine [facility]) 5-1.5 MG/5ML syrup Take 5 mLs by mouth every 8 (eight) hours as needed for cough. 02/23/15   Darlyne Russian, MD  losartan (COZAAR) 50 MG tablet Take 1 tablet (50 mg total) by mouth  daily. 02/08/15   Darlyne Russian, MD  Mesalamine (DELZICOL) 400 MG CPDR DR capsule Take 1 capsule (400 mg total) by mouth 2 (two) times daily. One capsule daily 02/08/15   Darlyne Russian, MD  Multiple Vitamins-Minerals (VISION VITAMINS) TABS Take 1 tablet by mouth daily.    Historical Provider, MD  predniSONE (DELTASONE) 20 MG tablet Take 2 tablets daily 02/23/15   Darlyne Russian, MD  promethazine (PHENERGAN) 12.5 MG tablet Take 1 tablet (12.5 mg total) by mouth every 8 (eight) hours as needed for nausea or vomiting. Patient not taking: Reported on 02/23/2015 08/21/14   Nishant Dhungel, MD   BP 160/71 mmHg  Pulse 69  Temp(Src) 98 F (36.7 C) (Oral)  Resp 20  SpO2 97%   Physical Exam  Constitutional: She is oriented to person, place, and time. She  appears well-developed and well-nourished. No distress.  HENT:  Head: Normocephalic and atraumatic.  Mouth/Throat: Oropharynx is clear and moist.  Eyes: Conjunctivae and EOM are normal. Pupils are equal, round, and reactive to light.  Neck: Normal range of motion. Neck supple.  Cardiovascular: Normal rate, regular rhythm and normal heart sounds.   Pulmonary/Chest: Effort normal and breath sounds normal. No respiratory distress. She has no wheezes.  Abdominal: Soft. Bowel sounds are normal. There is no tenderness. There is no guarding.  Musculoskeletal: Normal range of motion.  Right shoulder with TTP along humeral head and AC joint; no bony deformity about the right shoulder or clavicle; limited ROM due to pain; no bruising or swelling; strong radial pulse; normal grip strength; moving all finger normally  Neurological: She is alert and oriented to person, place, and time.  Skin: Skin is warm and dry. She is not diaphoretic.  Psychiatric: She has a normal mood and affect.  Nursing note and vitals reviewed.   ED Course  ORTHOPEDIC INJURY TREATMENT Date/Time: 06/26/2015 9:51 PM Performed by: Larene Pickett Authorized by: Larene Pickett Consent: Verbal consent obtained. Risks and benefits: risks, benefits and alternatives were discussed Consent given by: patient Patient understanding: patient states understanding of the procedure being performed Required items: required blood products, implants, devices, and special equipment available Patient identity confirmed: verbally with patient Injury location: shoulder Location details: right shoulder Injury type: soft tissue Pre-procedure neurovascular assessment: neurovascularly intact Immobilization: sling Post-procedure neurovascular assessment: post-procedure neurovascularly intact Patient tolerance: Patient tolerated the procedure well with no immediate complications   (including critical care time) Labs Review Labs Reviewed - No  data to display  Imaging Review Dg Shoulder Right  06/26/2015  CLINICAL DATA:  Initial evaluation for acute trauma, fall. EXAM: RIGHT SHOULDER - 2+ VIEW COMPARISON:  None. FINDINGS: No acute fracture or dislocation. Humeral head in normal alignment with the glenoid. AC joint approximated. No periarticular calcification. No soft tissue abnormality. Mild degenerative osteoarthritic changes at the right [facility] joint. Visualized right hemithorax clear. IMPRESSION: 1. No acute osseous abnormality about the shoulder. 2. Mild degenerative osteoarthrosis at the right [facility] joint. Electronically Signed   By: Jeannine Boga M.D.   On: 06/26/2015 20:39   I have personally reviewed and evaluated these images and lab results as part of my medical decision-making.   EKG Interpretation None      MDM   Final diagnoses:  Right shoulder pain  Fall, initial encounter    65 year old female here after a fall onto her right shoulder. No head injury loss of consciousness. She felt a "pop" at the time of fall , concerned for dislocation.  Right shoulder has tenderness to palpation along the humeral head and before meals joint. There is no bony deformity about the shoulder or clavicle. She has limited range of motion due to pain. Arm is neurovascularly intact. X-rays negative for acute fracture or dislocation. Patient placed in arm sling for comfort. Given orthopedic follow-up if no improvement next week. Rx Vicodin.  Discussed plan with patient, he/she acknowledged understanding and agreed with plan of care.  Return precautions given for new or worsening symptoms.  Larene Pickett, PA-C 06/26/15 2152  Lacretia Leigh, MD 06/26/15 [phone]

## 2015-06-26 NOTE — Discharge Instructions (Signed)
Take the prescribed medication as directed. Wear sling for now until shoulder is more comfortable. Follow-up with orthopedics if no improvement in the next week. Return to the ED for new or worsening symptoms.

## 2015-06-26 NOTE — ED Notes (Signed)
Ortho tech at bedside 

## 2015-06-26 NOTE — ED Notes (Addendum)
Pt states that she fell today after tripping over a basket and now has R shoulder pain. Unsure if it popped out and popped back in. Alert and oriented. Denies LOC or head injury.

## 2015-06-26 NOTE — ED Notes (Signed)
Pt transported to XRay 

## 2015-06-26 NOTE — ED Notes (Signed)
PA at bedside.

## 2015-07-04 ENCOUNTER — Ambulatory Visit (INDEPENDENT_AMBULATORY_CARE_PROVIDER_SITE_OTHER): Payer: 59 | Admitting: Emergency Medicine

## 2015-07-04 DIAGNOSIS — S43421A Sprain of right rotator cuff capsule, initial encounter: Secondary | ICD-10-CM

## 2015-07-04 NOTE — Progress Notes (Signed)
Subjective:  This chart was scribed for Arlyss Queen MD, by Tamsen Roers, at [facility].  This patient was seen in room 5 and the patient's care was started at 10:26 AM.   Chief Complaint  Patient presents with  . Fall    seen at [facility]  . Shoulder Pain    right     Patient ID: Jessica Stephens, female    DOB: 29-Jan-1951, 65 y.o.   MRN: [id number]  HPI HPI Comments: Jessica Stephens is a 65 y.o. female who presents to the [facility] for a follow up regarding her right shoulder pain which she has been seen in the ER for. 06/26/2015- Was seen in the ER with no fracture but there was degenerative arthritis at the right [facility] joint. Her injury occurred about falling right on her shoulder on a crate in her home while walking in the dark and heard a "click" sound come from her shoulder as it occurred. Marland Kitchen She was unable to get up after the incident occurred. She is now having difficulty reaching properly or grabbing things.  She has been taking 2 ibuprofen per day and drinks wine in the evening.  Patient does not have an orthopedist but is willing to see one. She would like a note for restrictions at work.   She took 2 days off after her injury and started working after that.      Patient Active Problem List   Diagnosis Date Noted  . Prolonged Q-T interval on ECG 08/21/2014  . Lobar pneumonia due to unspecified organism 08/21/2014  . CAP (community acquired pneumonia) 08/19/2014  . Community acquired pneumonia 08/19/2014  . Sepsis ([place]) 08/19/2014  . Hypokalemia 08/19/2014  . HYPERLIPIDEMIA 05/29/2010  . DYSPNEA 05/29/2010  . CHEST PAIN, ATYPICAL 05/02/2010  . ESOPHAGEAL STRICTURE 04/07/2010  . HIATAL HERNIA 04/07/2010  . ULCERATIVE PROCTITIS 04/07/2010  . ARTHRITIS 04/07/2010  . LUQ PAIN 04/07/2010  . TRANSAMINASES, SERUM, ELEVATED 04/07/2010  . VITAMIN B12 DEFICIENCY 01/30/2008  . ANXIETY, CHRONIC 01/29/2008  . Essential hypertension  01/29/2008  . CHRONIC MAXILLARY SINUSITIS 04/24/2007  . G E R D 04/24/2007  . UNSPECIFIED OSTEOPOROSIS 04/24/2007  . ULCERATIVE COLITIS, LEFT SIDED 02/25/2007   Past Medical History  Diagnosis Date  . Hypertension   . Abnormal transaminases   . Proctitis   . Arthritis   . Esophageal stricture   . Hiatal hernia   . Vitamin B12 deficiency   . Anxiety   . Ulcerative colitis ([place])     left sided  . GERD (gastroesophageal reflux disease)   . Maxillary sinusitis   . Hyperlipidemia    Past Surgical History  Procedure Laterality Date  . Cesarean section    . Nose surg    . Nose surgery     Allergies  Allergen Reactions  . Alendronate Sodium   . Sulfonamide Derivatives    Prior to Admission medications   Medication Sig Start Date End Date Taking? Authorizing Provider  amLODipine (NORVASC) 10 MG tablet TAKE ONE TABLET DAILY FOR BLOOD PRESSURE. 02/08/15  Yes Darlyne Russian, MD  atorvastatin (LIPITOR) 20 MG tablet Take 1 tablet (20 mg total) by mouth daily. 02/10/15  Yes Darlyne Russian, MD  cetirizine (ZYRTEC ALLERGY) 10 MG tablet Take 1 tablet (10 mg total) by mouth daily. 01/27/14  Yes Robyn Haber, MD  dextromethorphan-guaiFENesin [facility] DM) 30-600 MG 12hr tablet Take 1 tablet by mouth 2 (two) times daily. 02/23/15  Yes Darlyne Russian, MD  esomeprazole (NEXIUM) 20 MG capsule Take 20 mg by mouth daily as needed (indigestion).    Yes Historical Provider, MD  fluticasone (FLONASE) 50 MCG/ACT nasal spray Place 2 sprays into both nostrils daily. 02/08/15  Yes Darlyne Russian, MD  ibuprofen (ADVIL,MOTRIN) 600 MG tablet Take 1 tablet (600 mg total) by mouth 3 (three) times daily. 06/26/15  Yes Larene Pickett, PA-C  losartan (COZAAR) 50 MG tablet Take 1 tablet (50 mg total) by mouth daily. 02/08/15  Yes Darlyne Russian, MD  Mesalamine (DELZICOL) 400 MG CPDR DR capsule Take 1 capsule (400 mg total) by mouth 2 (two) times daily. One capsule daily 02/08/15  Yes Darlyne Russian, MD  Multiple  Vitamins-Minerals (VISION VITAMINS) TABS Take 1 tablet by mouth daily.   Yes Historical Provider, MD  HYDROcodone-acetaminophen (NORCO/VICODIN) 5-325 MG tablet Take 1 tablet by mouth every 4 (four) hours as needed. Patient not taking: Reported on 07/04/2015 06/26/15   Larene Pickett, PA-C  HYDROcodone-homatropine [facility]) 5-1.5 MG/5ML syrup Take 5 mLs by mouth every 8 (eight) hours as needed for cough. Patient not taking: Reported on 07/04/2015 02/23/15   Darlyne Russian, MD  predniSONE (DELTASONE) 20 MG tablet Take 2 tablets daily Patient not taking: Reported on 07/04/2015 02/23/15   Darlyne Russian, MD  promethazine (PHENERGAN) 12.5 MG tablet Take 1 tablet (12.5 mg total) by mouth every 8 (eight) hours as needed for nausea or vomiting. Patient not taking: Reported on 02/23/2015 08/21/14   Louellen Molder, MD   Social History   Social History  . Marital Status: Married    Spouse Name: N/A  . Number of Children: N/A  . Years of Education: N/A   Occupational History  . Not on file.   Social History Main Topics  . Smoking status: Never Smoker   . Smokeless tobacco: Never Used  . Alcohol Use: No  . Drug Use: No  . Sexual Activity: Yes    Birth Control/ Protection: None   Other Topics Concern  . Not on file   Social History Narrative       Review of Systems  Constitutional: Negative for fever and chills.  Eyes: Negative for pain, redness and itching.  Respiratory: Negative for cough and shortness of breath.   Gastrointestinal: Negative for nausea and vomiting.  Musculoskeletal: Positive for myalgias. Negative for gait problem, neck pain and neck stiffness.  Neurological: Negative for syncope and speech difficulty.       Objective:   Physical Exam There were no vitals filed for this visit.  CONSTITUTIONAL: Well developed/well nourished HEAD: Normocephalic/atraumatic EYES: EOMI/PERRL NECK: supple no meningeal signs SPINE/BACK:entire spine nontender CV: S1/S2 noted, no  murmurs/rubs/gallops noted LUNGS: Lungs are clear to auscultation bilaterally, no apparent distress NEURO:  reflexes are two plus and there is good right grip strength.  EXTREMITIES: She is unable to abduct the right shoulder.  She has weakness with internal and external rotation SKIN: warm, color normal PSYCH: no abnormalities of mood noted, alert and oriented to situation EKG there is a first-degree heart block.    Assessment & Plan:  Patient may have a rotator cuff injury. Referral made to orthopedics for their evaluation. X-rays done in the emergency room were normal. She will apply ice and do some gentle range of motion exercises. I personally performed the services described in this documentation, which was scribed in my presence. The recorded information has been reviewed and is accurate.

## 2015-07-04 NOTE — Patient Instructions (Signed)
Rotator Cuff Injury Rotator cuff injury is any type of injury to the set of muscles and tendons that make up the stabilizing unit of your shoulder. This unit holds the ball of your upper arm bone (humerus) in the socket of your shoulder blade (scapula).  CAUSES Injuries to your rotator cuff most commonly come from sports or activities that cause your arm to be moved repeatedly over your head. Examples of this include throwing, weight lifting, swimming, or racquet sports. Long lasting (chronic) irritation of your rotator cuff can cause soreness and swelling (inflammation), bursitis, and eventual damage to your tendons, such as a tear (rupture). SIGNS AND SYMPTOMS Acute rotator cuff tear:  Sudden tearing sensation followed by severe pain shooting from your upper shoulder down your arm toward your elbow.  Decreased range of motion of your shoulder because of pain and muscle spasm.  Severe pain.  Inability to raise your arm out to the side because of pain and loss of muscle power (large tears). Chronic rotator cuff tear:  Pain that usually is worse at night and may interfere with sleep.  Gradual weakness and decreased shoulder motion as the pain worsens.  Decreased range of motion. Rotator cuff tendinitis:  Deep ache in your shoulder and the outside upper arm over your shoulder.  Pain that comes on gradually and becomes worse when lifting your arm to the side or turning it inward. DIAGNOSIS Rotator cuff injury is diagnosed through a medical history, physical exam, and imaging exam. The medical history helps determine the type of rotator cuff injury. Your health care provider will look at your injured shoulder, feel the injured area, and ask you to move your shoulder in different positions. X-ray exams typically are done to rule out other causes of shoulder pain, such as fractures. MRI is the exam of choice for the most severe shoulder injuries because the images show muscles and tendons.    TREATMENT  Chronic tear:  Medicine for pain, such as acetaminophen or ibuprofen.  Physical therapy and range-of-motion exercises may be helpful in maintaining shoulder function and strength.  Steroid injections into your shoulder joint.  Surgical repair of the rotator cuff if the injury does not heal with noninvasive treatment. Acute tear:  Anti-inflammatory medicines such as ibuprofen and naproxen to help reduce pain and swelling.  A sling to help support your arm and rest your rotator cuff muscles. Long-term use of a sling is not advised. It may cause significant stiffening of the shoulder joint.  Surgery may be considered within a few weeks, especially in younger, active people, to return the shoulder to full function.  Indications for surgical treatment include the following:  Age younger than 62 years.  Rotator cuff tears that are complete.  Physical therapy, rest, and anti-inflammatory medicines have been used for 6-8 weeks, with no improvement.  Employment or sporting activity that requires constant shoulder use. Tendinitis:  Anti-inflammatory medicines such as ibuprofen and naproxen to help reduce pain and swelling.  A sling to help support your arm and rest your rotator cuff muscles. Long-term use of a sling is not advised. It may cause significant stiffening of the shoulder joint.  Severe tendinitis may require:  Steroid injections into your shoulder joint.  Physical therapy.  Surgery. HOME CARE INSTRUCTIONS   Apply ice to your injury:  Put ice in a plastic bag.  Place a towel between your skin and the bag.  Leave the ice on for 20 minutes, 2-3 times a day.  If you  have a shoulder immobilizer (sling and straps), wear it until told otherwise by your health care provider.  You may want to sleep on several pillows or in a recliner at night to lessen swelling and pain.  Only take over-the-counter or prescription medicines for pain, discomfort, or fever as  directed by your health care provider.  Do simple hand squeezing exercises with a soft rubber ball to decrease hand swelling. SEEK MEDICAL CARE IF:   Your shoulder pain increases, or new pain or numbness develops in your arm, hand, or fingers.  Your hand or fingers are colder than your other hand. SEEK IMMEDIATE MEDICAL CARE IF:   Your arm, hand, or fingers are numb or tingling.  Your arm, hand, or fingers are increasingly swollen and painful, or they turn white or blue. MAKE SURE YOU:  Understand these instructions.  Will watch your condition.  Will get help right away if you are not doing well or get worse.   This information is not intended to replace advice given to you by your health care provider. Make sure you discuss any questions you have with your health care provider.   Document Released: 03/16/2000 Document Revised: 03/24/2013 Document Reviewed: 10/29/2012 Elsevier Interactive Patient Education [employer].

## 2015-10-20 ENCOUNTER — Ambulatory Visit (INDEPENDENT_AMBULATORY_CARE_PROVIDER_SITE_OTHER): Payer: 59 | Admitting: Family Medicine

## 2015-10-20 VITALS — BP 120/72 | HR 84 | Temp 98.1°F | Ht 60.8 in | Wt 162.0 lb

## 2015-10-20 DIAGNOSIS — N390 Urinary tract infection, site not specified: Secondary | ICD-10-CM

## 2015-10-20 DIAGNOSIS — R109 Unspecified abdominal pain: Secondary | ICD-10-CM

## 2015-10-20 DIAGNOSIS — R300 Dysuria: Secondary | ICD-10-CM | POA: Diagnosis not present

## 2015-10-20 DIAGNOSIS — R350 Frequency of micturition: Secondary | ICD-10-CM

## 2015-10-20 LAB — POCT URINALYSIS DIP (MANUAL ENTRY)
BILIRUBIN UA: NEGATIVE
BILIRUBIN UA: NEGATIVE
Glucose, UA: NEGATIVE
Nitrite, UA: NEGATIVE
PH UA: 6
PROTEIN UA: NEGATIVE
Urobilinogen, UA: 0.2

## 2015-10-20 LAB — POC MICROSCOPIC URINALYSIS (UMFC): MUCUS RE: ABSENT

## 2015-10-20 MED ORDER — CIPROFLOXACIN HCL 500 MG PO TABS: 500.0000 mg | ORAL_TABLET | Freq: Two times a day (BID) | ORAL | Status: DC

## 2015-10-20 MED ORDER — PHENAZOPYRIDINE HCL 200 MG PO TABS: 200.0000 mg | ORAL_TABLET | Freq: Three times a day (TID) | ORAL | Status: DC | PRN

## 2015-10-20 NOTE — Progress Notes (Signed)
Patient ID: MEDIA PIZZINI, female    DOB: 01/13/1951, 65 y.o.   MRN: [id number]  PCP: Jenny Reichmann, MD  Chief Complaint  Patient presents with  . Urinary Frequency    symptoms began yesterday  . urinary burning  . painful urination  . Flank Pain    Subjective:   HPI Presents for evaluation of  Urinary frequency, urgency, and burning.  Patient reports the following symptoms times one day ago: Sudden onset of sharp pelvic pain, accompanied by constant urge to urinate. Urgency, frequency, burning, with each attempt to void. She reports new onset right flank pain today. Denies fever, but experience chills today. She has not taken any medication and has increased water and cranberry juice intake.  Patient reports the recent death of her mother that has dementia.  She reports being fatigued and stressed related to making arrangements for the funeral services.    Review of Systems  Constitutional: Positive for chills and fatigue. Negative for fever.  Respiratory: Negative.   Cardiovascular: Negative.   Genitourinary: Positive for dysuria, urgency, frequency, flank pain and pelvic pain.  Musculoskeletal: Negative.   Psychiatric/Behavioral:       See HPI       Patient Active Problem List   Diagnosis Date Noted  . Prolonged Q-T interval on ECG 08/21/2014  . Lobar pneumonia due to unspecified organism 08/21/2014  . CAP (community acquired pneumonia) 08/19/2014  . Community acquired pneumonia 08/19/2014  . Sepsis ([place]) 08/19/2014  . Hypokalemia 08/19/2014  . HYPERLIPIDEMIA 05/29/2010  . DYSPNEA 05/29/2010  . CHEST PAIN, ATYPICAL 05/02/2010  . ESOPHAGEAL STRICTURE 04/07/2010  . HIATAL HERNIA 04/07/2010  . ULCERATIVE PROCTITIS 04/07/2010  . ARTHRITIS 04/07/2010  . LUQ PAIN 04/07/2010  . TRANSAMINASES, SERUM, ELEVATED 04/07/2010  . VITAMIN B12 DEFICIENCY 01/30/2008  . ANXIETY, CHRONIC 01/29/2008  . Essential hypertension 01/29/2008  . CHRONIC MAXILLARY SINUSITIS  04/24/2007  . G E R D 04/24/2007  . UNSPECIFIED OSTEOPOROSIS 04/24/2007  . ULCERATIVE COLITIS, LEFT SIDED 02/25/2007     Prior to Admission medications   Medication Sig Start Date End Date Taking? Authorizing Provider  amLODipine (NORVASC) 10 MG tablet TAKE ONE TABLET DAILY FOR BLOOD PRESSURE. 02/08/15  Yes Darlyne Russian, MD  atorvastatin (LIPITOR) 20 MG tablet Take 1 tablet (20 mg total) by mouth daily. 02/10/15  Yes Darlyne Russian, MD  cetirizine (ZYRTEC ALLERGY) 10 MG tablet Take 1 tablet (10 mg total) by mouth daily. 01/27/14  Yes Robyn Haber, MD  esomeprazole (NEXIUM) 20 MG capsule Take 20 mg by mouth daily as needed (indigestion).    Yes Historical Provider, MD  fluticasone (FLONASE) 50 MCG/ACT nasal spray Place 2 sprays into both nostrils daily. 02/08/15  Yes Darlyne Russian, MD  losartan (COZAAR) 50 MG tablet Take 1 tablet (50 mg total) by mouth daily. 02/08/15  Yes Darlyne Russian, MD  Mesalamine (DELZICOL) 400 MG CPDR DR capsule Take 1 capsule (400 mg total) by mouth 2 (two) times daily. One capsule daily 02/08/15  Yes Darlyne Russian, MD  Multiple Vitamins-Minerals (VISION VITAMINS) TABS Take 1 tablet by mouth daily.   Yes Historical Provider, MD  ibuprofen (ADVIL,MOTRIN) 600 MG tablet Take 1 tablet (600 mg total) by mouth 3 (three) times daily. Patient not taking: Reported on 10/20/2015 06/26/15   Larene Pickett, PA-C     Allergies  Allergen Reactions  . Alendronate Sodium   . Sulfonamide Derivatives        Objective:  Physical Exam  Constitutional: She is oriented to person, place, and time. She appears well-developed and well-nourished.  HENT:  Head: Normocephalic.  Eyes: Conjunctivae and EOM are normal.  Neck: Normal range of motion. Neck supple.  Cardiovascular: Normal rate, regular rhythm, normal heart sounds and intact distal pulses.   Pulmonary/Chest: Breath sounds normal.  Abdominal: Soft. Bowel sounds are normal. She exhibits no distension. There is no tenderness.    Genitourinary:  Positive for right flank pain   Musculoskeletal: Normal range of motion.  Neurological: She is alert and oriented to person, place, and time.  Skin: Skin is warm and dry.  Psychiatric: Judgment and thought content normal.           Assessment & Plan:  .1. Dysuria - POCT Microscopic Urinalysis ([facility]) - POCT urinalysis dipstick  2. Flank pain - POCT Microscopic Urinalysis ([facility]) - POCT urinalysis dipstick  3. Urinary tract infection, site not specified - Urine culture-pending   4. Urinary frequency  Urine microscopic resulted lab confirmed urinary tract infection. Prescribed 10 day course of ciprofloxacin 500 mg, bid. For pain, take phenazopyridine 200 mg tid, daily as needed. Return to the office if symptoms do not improve within 72 hours or if a fever, develops. Increase water 6-8 glasses daily. May drink cranberry juice in addition to water, limit to 8 oz daily due to sugar content of juice.   Carroll Sage. Kenton Kingfisher, MSN, FNP-C [facility]

## 2015-10-20 NOTE — Patient Instructions (Addendum)
Continue to drink 6-8 glasses or water . You may drink cranberry juice. Limit intake 8 oz.  Antispasmodic ordered for painful urination.   If fever develops or persistent chills occur, contact our office immediately.  Follow-up with our office on Monday to notify me if symptoms are improving.   IF you received an x-ray today, you will receive an invoice from [facility]. Please contact [facility] at [phone] with questions or concerns regarding your invoice.   IF you received labwork today, you will receive an invoice from [employer]. Please contact [facility] at [phone] with questions or concerns regarding your invoice.   Our billing staff will not be able to assist you with questions regarding bills from these companies.  You will be contacted with the lab results as soon as they are available. The fastest way to get your results is to activate your My Chart account. Instructions are located on the last page of this paperwork. If you have not heard from [place] regarding the results in 2 weeks, please contact this office.

## 2015-10-22 ENCOUNTER — Telehealth: Payer: Self-pay

## 2015-10-22 LAB — URINE CULTURE

## 2015-10-22 NOTE — Telephone Encounter (Signed)
Patient unable to go to work due to severe abdominal pain and under significant stress due to recent loss of her mother. Patient spoke with Dr. Everlene Farrier and I. A work note was made for her to excuse her from work until Monday, 10/24/2015.

## 2015-10-22 NOTE — Telephone Encounter (Signed)
Patient stated she is not improving. She still have urgency to go to the bathroom. She is not taking the pain medication. [phone]. Patient also request for a work note.

## 2015-10-22 NOTE — Telephone Encounter (Signed)
Needs for OOW - July 20 - through Sunday.    She will go back to work on Monday.   Please have [facility] call her.   Best [phone]

## 2016-01-20 ENCOUNTER — Ambulatory Visit (INDEPENDENT_AMBULATORY_CARE_PROVIDER_SITE_OTHER): Payer: 59

## 2016-01-20 ENCOUNTER — Ambulatory Visit (INDEPENDENT_AMBULATORY_CARE_PROVIDER_SITE_OTHER): Payer: 59 | Admitting: Family Medicine

## 2016-01-20 VITALS — BP 120/78 | HR 76 | Temp 98.0°F | Resp 17 | Ht 60.5 in | Wt 160.0 lb

## 2016-01-20 DIAGNOSIS — Z23 Encounter for immunization: Secondary | ICD-10-CM | POA: Diagnosis not present

## 2016-01-20 DIAGNOSIS — I10 Essential (primary) hypertension: Secondary | ICD-10-CM | POA: Diagnosis not present

## 2016-01-20 DIAGNOSIS — E785 Hyperlipidemia, unspecified: Secondary | ICD-10-CM

## 2016-01-20 DIAGNOSIS — M25561 Pain in right knee: Secondary | ICD-10-CM

## 2016-01-20 DIAGNOSIS — M810 Age-related osteoporosis without current pathological fracture: Secondary | ICD-10-CM

## 2016-01-20 DIAGNOSIS — M79661 Pain in right lower leg: Secondary | ICD-10-CM | POA: Diagnosis not present

## 2016-01-20 DIAGNOSIS — S86811A Strain of other muscle(s) and tendon(s) at lower leg level, right leg, initial encounter: Secondary | ICD-10-CM

## 2016-01-20 LAB — COMPREHENSIVE METABOLIC PANEL
ALBUMIN: 4.5 g/dL (ref 3.6–5.1)
ALT: 29 U/L (ref 6–29)
AST: 24 U/L (ref 10–35)
Alkaline Phosphatase: 98 U/L (ref 33–130)
BUN: 10 mg/dL (ref 7–25)
CALCIUM: 9.2 mg/dL (ref 8.6–10.4)
CHLORIDE: 106 mmol/L (ref 98–110)
CO2: 26 mmol/L (ref 20–31)
Creat: 0.77 mg/dL (ref 0.50–0.99)
Glucose, Bld: 87 mg/dL (ref 65–99)
POTASSIUM: 3.9 mmol/L (ref 3.5–5.3)
SODIUM: 143 mmol/L (ref 135–146)
Total Bilirubin: 0.7 mg/dL (ref 0.2–1.2)
Total Protein: 7.3 g/dL (ref 6.1–8.1)

## 2016-01-20 LAB — LIPID PANEL
Cholesterol: 145 mg/dL (ref 125–200)
HDL: 53 mg/dL (ref >=46)
LDL CALC: 61 mg/dL (ref <130)
TRIGLYCERIDES: 157 mg/dL — AB (ref <150)
Total CHOL/HDL Ratio: 2.7 Ratio (ref <=5.0)
VLDL: 31 mg/dL — AB (ref <30)

## 2016-01-20 MED ORDER — ATORVASTATIN CALCIUM 20 MG PO TABS: 20.0000 mg | ORAL_TABLET | Freq: Every day | ORAL | 3 refills | Status: DC

## 2016-01-20 MED ORDER — METHOCARBAMOL 500 MG PO TABS: 500.0000 mg | ORAL_TABLET | Freq: Four times a day (QID) | ORAL | 0 refills | Status: DC

## 2016-01-20 MED ORDER — HYDROCODONE-ACETAMINOPHEN 5-325 MG PO TABS: 1.0000 | ORAL_TABLET | ORAL | 0 refills | Status: DC | PRN

## 2016-01-20 MED ORDER — AMLODIPINE BESYLATE 10 MG PO TABS: ORAL_TABLET | ORAL | 3 refills | Status: DC

## 2016-01-20 MED ORDER — MELOXICAM 7.5 MG PO TABS: 7.5000 mg | ORAL_TABLET | Freq: Every day | ORAL | 0 refills | Status: DC

## 2016-01-20 MED ORDER — LOSARTAN POTASSIUM 50 MG PO TABS: 50.0000 mg | ORAL_TABLET | Freq: Every day | ORAL | 3 refills | Status: DC

## 2016-01-20 NOTE — Progress Notes (Signed)
By signing my name below, I, [name], attest that this documentation has been prepared under the direction and in the presence of [name], MD.  Electronically Signed: [name], Medical Scribe. 01/20/16. 11:27 AM.  Subjective:    Patient ID: Jessica Stephens, female    DOB: [birth year], 65 y.o.   MRN: [id number]  HPI Chief Complaint  Patient presents with  . Leg Pain    right side   . Immunizations    flu    HPI Comments: Jessica Stephens is a 65 y.o. female with a PMHx of  who presents to the [facility] complaining of severe right leg pain onset 3 weeks. Pain is located in her calf, knee, and hamstring. Pt states she can't walk, she has difficulty with walking up the steps, and feels like she's going to fall down when she stands up. Pt now has to hold on the the rails when she walks up the step for support and she mentions she normally "keeps up with young people". Pt works in Scientist, research (medical) for [employer] and is on her feet all day for +4 hour shifts causing her feet to hurt at the end of the day. Pain is slightly alleviated with ibuprofen (she took 2 yesterday and 1 today) and "can't be relieved with bengay". Pt will get a charlie horse if she doesn't drink enough water and she does not wear her compression stockings because they roll down and hurt her. Pt normally takes a multi vitamin, and does not take ASA daily. Pt denies hx of smoking or secondary smoke. Pt denies joint pain, back pain, chest pain, palpitations, SOB, cough.   Pt states it hurts to lay on her abdomen and she doesn't sleep well. Pt will take a 10-15 mins nap and she'll have energy. 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 Pt would like to get a refill on amlodipine, lipitor, and losartan. Pt is fasting.  Pt was sent to [place] prior for her shoulder after a fall during Easter and states she can no longer scrub her  back or lift heavy objects.  Patient Active Problem List   Diagnosis Date Noted  . Prolonged Q-T interval on ECG 08/21/2014  . Lobar pneumonia due to unspecified organism 08/21/2014  . CAP (community acquired pneumonia) 08/19/2014  . Community acquired pneumonia 08/19/2014  . Sepsis ([place]) 08/19/2014  . Hypokalemia 08/19/2014  . HYPERLIPIDEMIA 05/29/2010  . DYSPNEA 05/29/2010  . CHEST PAIN, ATYPICAL 05/02/2010  . ESOPHAGEAL STRICTURE 04/07/2010  . HIATAL HERNIA 04/07/2010  . ULCERATIVE PROCTITIS 04/07/2010  . ARTHRITIS 04/07/2010  . LUQ PAIN 04/07/2010  . TRANSAMINASES, SERUM, ELEVATED 04/07/2010  . VITAMIN B12 DEFICIENCY 01/30/2008  . ANXIETY, CHRONIC 01/29/2008  . Essential hypertension 01/29/2008  . CHRONIC MAXILLARY SINUSITIS 04/24/2007  . G E R D 04/24/2007  . UNSPECIFIED OSTEOPOROSIS 04/24/2007  . ULCERATIVE COLITIS, LEFT SIDED 02/25/2007   Past Medical History:  Diagnosis Date  . Abnormal transaminases   . Anxiety   . Arthritis   . Esophageal stricture   . GERD (gastroesophageal reflux disease)   . Hiatal hernia   . Hyperlipidemia   . Hypertension   . Maxillary sinusitis   . Proctitis   . Ulcerative colitis ([place])    left sided  . Vitamin B12 deficiency    Past Surgical History:  Procedure Laterality Date  . CESAREAN SECTION    .  nose surg    . NOSE SURGERY     Allergies  Allergen Reactions  . Alendronate Sodium   . Sulfonamide Derivatives    Prior to Admission medications   Medication Sig Start Date End Date Taking? Authorizing Provider  amLODipine (NORVASC) 10 MG tablet TAKE ONE TABLET DAILY FOR BLOOD PRESSURE. 02/08/15  Yes Darlyne Russian, MD  atorvastatin (LIPITOR) 20 MG tablet Take 1 tablet (20 mg total) by mouth daily. 02/10/15  Yes Darlyne Russian, MD  cetirizine (ZYRTEC ALLERGY) 10 MG tablet Take 1 tablet (10 mg total) by mouth daily. 01/27/14  Yes Robyn Haber, MD  esomeprazole (NEXIUM) 20 MG capsule Take 20 mg by mouth daily as needed  (indigestion).    Yes Historical Provider, MD  ibuprofen (ADVIL,MOTRIN) 600 MG tablet Take 1 tablet (600 mg total) by mouth 3 (three) times daily. 06/26/15  Yes Larene Pickett, PA-C  losartan (COZAAR) 50 MG tablet Take 1 tablet (50 mg total) by mouth daily. 02/08/15  Yes Darlyne Russian, MD  Multiple Vitamins-Minerals (VISION VITAMINS) TABS Take 1 tablet by mouth daily.   Yes Historical Provider, MD   Social History   Social History  . Marital status: Married    Spouse name: N/A  . Number of children: N/A  . Years of education: N/A   Occupational History  . Not on file.   Social History Main Topics  . Smoking status: Never Smoker  . Smokeless tobacco: Never Used  . Alcohol use No  . Drug use: No  . Sexual activity: Yes    Birth control/ protection: None   Other Topics Concern  . Not on file   Social History Narrative  . No narrative on file   Review of Systems  Constitutional: Positive for activity change. Negative for chills and fever.  Respiratory: Negative for cough and shortness of breath.   Cardiovascular: Positive for leg swelling. Negative for chest pain and palpitations.  Gastrointestinal: Positive for abdominal pain.  Musculoskeletal: Positive for arthralgias, gait problem, joint swelling and myalgias.  Neurological: Positive for weakness. Negative for numbness.   Objective:  Physical Exam  Constitutional: She appears well-developed and well-nourished. No distress.  HENT:  Head: Normocephalic and atraumatic.  Eyes: Conjunctivae are normal.  Neck: Neck supple.  Cardiovascular: Normal rate.   Pulses:      Dorsalis pedis pulses are 2+ on the right side, and 2+ on the left side.       Posterior tibial pulses are 2+ on the right side, and 2+ on the left side.  Pulmonary/Chest: Effort normal.  Musculoskeletal:  Fullness and swelling in the right popliteal fossa Trace pitting edema on the left and right No TTP over L-spine or paraspinal muscles No tenderness over  the SI joints or the trochanter Calf bilateral circumference measures 38 cm Nl Thompson's test Nl squeeze test Nl stance from posterior Able to rise to balls of feet 2 legged but unable to raise on ball right foot alone Achilles appears to be intact No TTP over peroneal tendon No tenderness over the posterior medial malleoli She had TTP over the medial joint line Antalgic gait  Neurological: She is alert.  Reflex Scores:      Patellar reflexes are 2+ on the right side and 2+ on the left side.      Achilles reflexes are 2+ on the right side and 2+ on the left side. Lower extremity strength 5/5  Skin: Skin is warm and dry.  Psychiatric: She has  a normal mood and affect. Her behavior is normal.  Nursing note and vitals reviewed. BP 120/78 (BP Location: Right Arm, Patient Position: Sitting, Cuff Size: Normal)   Pulse 76   Temp 98 F (36.7 C) (Oral)   Resp 17   Ht 5' 0.5" (1.537 m)   Wt 160 lb (72.6 kg)   SpO2 97%   BMI 30.73 kg/m   Dg Ankle Complete Right  Result Date: 01/20/2016 CLINICAL DATA:  Pain.  No known injury EXAM: RIGHT ANKLE - COMPLETE 3+ VIEW COMPARISON:  None. FINDINGS: Frontal, oblique, and lateral views were obtained. There is no fracture or joint effusion. The ankle mortise appears intact. There is no appreciable joint space narrowing. There is an inferior calcaneal spur. IMPRESSION: Inferior calcaneal spur. No fracture or appreciable joint space narrowing. Ankle mortise appears intact. Electronically Signed   By: Lowella Grip III M.D.   On: 01/20/2016 13:04   Dg Knee Complete 4 Views Right  Result Date: 01/20/2016 CLINICAL DATA:  Pain for 3 weeks EXAM: RIGHT KNEE - COMPLETE 4+ VIEW COMPARISON:  September 10, 2005 FINDINGS: Frontal, lateral, and bilateral oblique views were obtained. There is no demonstrable fracture or dislocation. No joint effusion. There is joint space narrowing medially with slight spurring medially. Other joint spaces appear normal. No erosive  change. IMPRESSION: Osteoarthritic change medially.  No fracture or joint effusion. Electronically Signed   By: Lowella Grip III M.D.   On: 01/20/2016 13:03   Assessment & Plan:  Pt would like the results to be called in to her husbands phone #: [phone]  1. Right calf pain   2. Essential hypertension - cont norvasc 10 and losartan 50 qd  3. Hyperlipidemia, unspecified hyperlipidemia type - cont lipitor 20  4. Strain of calf muscle, right, initial encounter   5. Need for prophylactic vaccination and inoculation against influenza   6. Osteoporosis, unspecified osteoporosis type, unspecified pathological fracture presence   7. Acute pain of right knee    I suspect she has had a partial tear of calf muscle but not sure - refer to ortho for further eval/trx.  Placed in tall cam boot for now.  Start mobic with prn robaxin and hydrocodone only for severe breakthrough pain.  Orders Placed This Encounter  Procedures  . DG Knee Complete 4 Views Right    Standing Status:   Future    Number of Occurrences:   1    Standing Expiration Date:   01/19/2017    Order Specific Question:   Reason for Exam (SYMPTOM  OR DIAGNOSIS REQUIRED)    Answer:   calf and thigh pain wiht popliteal effusion for sev wks, + osteoporosis    Order Specific Question:   Preferred imaging location?    Answer:   External  . DG Ankle Complete Right    Standing Status:   Future    Number of Occurrences:   1    Standing Expiration Date:   01/19/2017    Order Specific Question:   Reason for Exam (SYMPTOM  OR DIAGNOSIS REQUIRED)    Answer:   limping and leg pain, + osteoporosis    Order Specific Question:   Preferred imaging location?    Answer:   External  . Flu Vaccine QUAD 36+ mos IM  . Comprehensive metabolic panel    Order Specific Question:   Has the patient fasted?    Answer:   Yes  . Lipid panel    Order Specific Question:  Has the patient fasted?    Answer:   Yes  . Ambulatory referral to Orthopedic  Surgery    Referral Priority:   Routine    Referral Type:   Surgical    Referral Reason:   Specialty Services Required    Requested Specialty:   Orthopedic Surgery    Number of Visits Requested:   1    Meds ordered this encounter  Medications  . amLODipine (NORVASC) 10 MG tablet    Sig: TAKE ONE TABLET DAILY FOR BLOOD PRESSURE.    Dispense:  90 tablet    Refill:  3  . atorvastatin (LIPITOR) 20 MG tablet    Sig: Take 1 tablet (20 mg total) by mouth daily.    Dispense:  90 tablet    Refill:  3  . losartan (COZAAR) 50 MG tablet    Sig: Take 1 tablet (50 mg total) by mouth daily.    Dispense:  90 tablet    Refill:  3  . meloxicam (MOBIC) 7.5 MG tablet    Sig: Take 1 tablet (7.5 mg total) by mouth daily.    Dispense:  30 tablet    Refill:  0  . methocarbamol (ROBAXIN) 500 MG tablet    Sig: Take 1 tablet (500 mg total) by mouth 4 (four) times daily.    Dispense:  40 tablet    Refill:  0  . HYDROcodone-acetaminophen (NORCO/VICODIN) 5-325 MG tablet    Sig: Take 1-2 tablets by mouth every 4 (four) hours as needed for moderate pain.    Dispense:  40 tablet    Refill:  0    I personally performed the services described in this documentation, which was scribed in my presence. The recorded information has been reviewed and considered, and addended by me as needed.   [name], M.D.  [facility] [address] [place], [place] [address] [phone] phone [phone] fax  01/29/16 11:14 PM

## 2016-01-20 NOTE — Patient Instructions (Addendum)
IF you received an x-ray today, you will receive an invoice from [facility]. Please contact [facility] at [phone] with questions or concerns regarding your invoice.   IF you received labwork today, you will receive an invoice from [employer]. Please contact [facility] at [phone] with questions or concerns regarding your invoice.   Our billing staff will not be able to assist you with questions regarding bills from these companies.  You will be contacted with the lab results as soon as they are available. The fastest way to get your results is to activate your My Chart account. Instructions are located on the last page of this paperwork. If you have not heard from [place] regarding the results in 2 weeks, please contact this office.   We recommend that you schedule a mammogram for breast cancer screening. Typically, you do not need a referral to do this. Please contact a local imaging center to schedule your mammogram.  [facility] - [phone]  *ask for the Radiology [place] - [phone] or [phone]  [facility] - [phone] [place] [phone] [facility] - [phone]  *ask for the [facility] - [phone]  *ask for the Radiology Department [facility] - [phone]  *ask for the Mammography Department [place] - [phone]    Medial Head Gastrocnemius Tear With Rehab Medial head gastrocnemius tear, also called tennis leg, is a tear (strain) in a muscle or tendon of the inner portion (medial head) of one of the calf muscles (gastrocnemius). The inner portion of the calf muscle attaches to the thigh bone (femur) and is responsible for bending the knee and straightening the foot (standing "on tiptoe"). Strains are classified into three categories. Grade 1 strains  cause pain, but the tendon is not lengthened. Grade 2 strains include a lengthened ligament, due to the ligament being stretched or partially ruptured. With grade 2 strains there is still function, although function may be decreased. Grade 3 strains involve a complete tear of the tendon or muscle, and function is usually impaired. SYMPTOMS   Sudden "pop" or tear felt at the time of injury.  Pain, tenderness, swelling, warmth, or redness over the middle inner calf.  Pain and weakness with ankle motion, especially flexing the ankle against resistance, as well as pain with lifting up the foot (extending the ankle).  Bruising (contusion) of the calf, heel, and sometimes the foot within 48 hours of injury.  Muscle spasm in the calf. CAUSES  Muscle and ligament strains occur when a force is placed on the muscle or ligament that is greater than it can handle. Common causes of injury include:  Direct hit (trauma) to the calf.  Sudden forceful pushing off or landing on the foot (jumping, landing, serving a tennis ball, lunging). RISK INCREASES WITH:  Sports that require sudden, explosive calf muscle contraction, such as those involving jumping (basketball), hill running, quick starts (running), or lunging (racquetball, tennis).  Contact sports (football, soccer, hockey).  Poor strength and flexibility.  Previous lower limb injury. PREVENTION  Warm up and stretch properly before activity.  Allow for adequate recovery between workouts.  Maintain physical fitness:  Strength, flexibility, and endurance.  Cardiovascular fitness.  Learn and use proper exercise technique.  Complete rehabilitation after lower limb injury, before returning to competition or practice. PROGNOSIS  If treated properly, tennis leg usually heals within  6 weeks of nonsurgical treatment.  RELATED COMPLICATIONS   Longer healing time, if not properly treated or if not given enough time to heal.  Recurring  symptoms and injury, if activity is resumed too soon, with overuse, with a direct blow, or with poor technique.  If untreated, may progress to a complete tear (rare) or other injury, due to limping and favoring of the injured leg.  Persistent limping, due to scarring and shortening of the calf muscles, as a result of inadequate rehabilitation.  Prolonged disability. TREATMENT  Treatment first involves the use of ice and medication to help reduce pain and inflammation. The use of strengthening and stretching exercises may help reduce pain with activity. These exercises may be performed at home or with a therapist. For severe injuries, referral to a therapist may be needed for further evaluation and treatment. Your caregiver may advise that you wear a brace to help healing. Sometimes, crutches are needed until you can walk without limping. Rarely, surgery is needed.  MEDICATION   If pain medicine is needed, nonsteroidal anti-inflammatory medicines (aspirin and ibuprofen), or other minor pain relievers (acetaminophen), are often advised.  Do not take pain medicine for 7 days before surgery.  Prescription pain relievers may be given, if your caregiver thinks they are needed. Use only as directed and only as much as you need. HEAT AND COLD  Cold treatment (icing) should be applied for 10 to 15 minutes every 2 to 3 hours for inflammation and pain, and immediately after activity that aggravates your symptoms. Use ice packs or an ice massage.  Heat treatment may be used before performing stretching and strengthening activities prescribed by your caregiver, physical therapist, or athletic trainer. Use a heat pack or a warm water soak. SEEK MEDICAL CARE IF:   Symptoms get worse or do not improve in 2 weeks, despite treatment.  Numbness or tingling develops.  New, unexplained symptoms develop. (Drugs used in treatment may produce side effects.) EXERCISES  RANGE OF MOTION (ROM) AND STRETCHING  EXERCISES - Medial Head Gastrocnemius Tear (Tennis Leg) These exercises may help you when beginning to rehabilitate your injury. Your symptoms may resolve with or without further involvement from your physician, physical therapist, or athletic trainer. While completing these exercises, remember:   Restoring tissue flexibility helps normal motion to return to the joints. This allows healthier, less painful movement and activity.  An effective stretch should be held for at least 30 seconds.  A stretch should never be painful. You should only feel a gentle lengthening or release in the stretched tissue. STRETCH - Gastrocsoleus  Sit with your right / left leg extended. Holding onto both ends of a belt or towel, loop it around the ball of your foot.  Keeping your right / left ankle and foot relaxed and your knee straight, pull your foot and ankle toward you using the belt.  You should feel a gentle stretch behind your calf or knee. Hold this position for __________ seconds. Repeat __________ times. Complete this stretch __________ times per day.  RANGE OF MOTION - Ankle Dorsiflexion, Active Assisted   Remove your shoes and sit on a chair, preferably not on a carpeted surface.  Place your right / left foot directly under the knee. Extend your opposite leg for support.  Keeping your heel down, slide your right / left foot back toward the chair, until you feel a stretch at your ankle or calf. If you do not feel a stretch, slide your bottom forward to  the edge of the chair, while still keeping your heel down.  Hold this stretch for __________ seconds. Repeat __________ times. Complete this stretch __________ times per day.  STRETCH - Gastroc, Standing   Place your hands on a wall.  Extend your right / left leg behind you, keeping the front knee somewhat bent.  Slightly point your toes inward on your back foot.  Keeping your right / left heel on the floor and your knee straight, shift your  weight toward the wall, not allowing your back to arch.  You should feel a gentle stretch in the right / left calf. Hold this position for __________ seconds. Repeat __________ times. Complete this stretch __________ times per day. STRETCH - Soleus, Standing   Place your hands on a wall.  Extend your right / left leg behind you, keeping the other knee somewhat bent.  Point your toes of your back foot slightly inward.  Keep your right / left heel on the floor, bend your back knee, and slightly shift your weight over the back leg so that you feel a gentle stretch deep in your back calf.  Hold this position for __________ seconds. Repeat __________ times. Complete this stretch __________ times per day. STRETCH - Gastrocsoleus, Standing Note: This exercise can place a lot of stress on your foot and ankle. Please complete this exercise only if specifically instructed by your caregiver.   Place the ball of your right / left foot on a step, keeping your other foot firmly on the same step.  Hold on to the wall or a rail for balance.  Slowly lift your other foot, allowing your body weight to press your heel down over the edge of the step.  You should feel a stretch in your right / left calf.  Hold this position for __________ seconds.  Repeat this exercise with a slight bend in your right / left knee. Repeat __________ times. Complete this stretch __________ times per day.  STRENGTHENING EXERCISES - Medial Head Gastrocnemius Tear (Tennis Leg) These exercises may help you when beginning to rehabilitate your injury. They may resolve your symptoms with or without further involvement from your physician, physical therapist, or athletic trainer. While completing these exercises, remember:   Muscles can gain both the endurance and the strength needed for everyday activities through controlled exercises.  Complete these exercises as instructed by your physician, physical therapist, or athletic  trainer. Increase the resistance and repetitions only as guided by your caregiver. STRENGTH - Plantar-flexors  Sit with your right / left leg extended. Holding onto both ends of a rubber exercise band or tubing, loop it around the ball of your foot. Keep a slight tension in the band.  Slowly push your toes away from you, pointing them downward.  Hold this position for __________ seconds. Return slowly, controlling the tension in the band. Repeat __________ times. Complete this exercise __________ times per day.  STRENGTH - Plantar-flexors  Stand with your feet shoulder width apart. Steady yourself with a wall or table, using as little support as needed.  Keeping your weight evenly spread over the width of your feet, rise up on your toes.*  Hold this position for __________ seconds. Repeat __________ times. Complete this exercise __________ times per day.  *If this is too easy, shift your weight toward your right / left leg until you feel challenged. Ultimately, you may be asked to do this exercise while standing on your right / left foot only. STRENGTH - Plantar-flexors, Eccentric  Note: This exercise can place a lot of stress on your foot and ankle. Please complete this exercise only if specifically instructed by your caregiver.   Place the balls of your feet on a step. With your hands, use only enough support from a wall or rail to keep your balance.  Keep your knees straight and rise up on your toes.  Slowly shift your weight entirely to your right / left toes and pick up your opposite foot. Gently and with controlled movement, lower your weight through your right / left foot so that your heel drops below the level of the step. You will feel a slight stretch in the back of your right / left calf.  Use the healthy leg to help rise up onto the balls of both feet, then lower weight only onto the right / left leg again. Build up to 15 repetitions. Then progress to 3 sets of 15  repetitions.*  After completing the above exercise, complete the same exercise with a slight knee bend (about 30 degrees). Again, build up to 15 repetitions. Then progress to 3 sets of 15 repetitions.* Perform this exercise __________ times per day.  *When you easily complete 3 sets of 15, your physician, physical therapist, or athletic trainer may advise you to add resistance, by wearing a backpack filled with additional weight.   This information is not intended to replace advice given to you by your health care provider. Make sure you discuss any questions you have with your health care provider.   Document Released: 03/19/2005 Document Revised: 04/09/2014 Document Reviewed: 07/01/2008 Elsevier Interactive Patient Education [employer].

## 2016-01-23 ENCOUNTER — Encounter: Payer: Self-pay | Admitting: Family Medicine

## 2016-02-20 ENCOUNTER — Telehealth: Payer: Self-pay

## 2016-02-20 NOTE — Telephone Encounter (Signed)
PT LM asking to check status of ortho referral and give her the number to ortho so she can call to set up appt. Called back and [facility] for pt w/[facility]'s number, referral was sent over end of Oct.

## 2016-03-13 ENCOUNTER — Encounter (HOSPITAL_COMMUNITY): Payer: Self-pay

## 2016-03-13 ENCOUNTER — Ambulatory Visit (HOSPITAL_COMMUNITY)
Admission: RE | Admit: 2016-03-13 | Discharge: 2016-03-13 | Disposition: A | Discharge status: Home / Self Care | Payer: 59 | Source: Ambulatory Visit | Attending: Internal Medicine | Admitting: Internal Medicine

## 2016-03-13 ENCOUNTER — Other Ambulatory Visit (HOSPITAL_COMMUNITY): Payer: Self-pay | Admitting: Sports Medicine

## 2016-03-13 DIAGNOSIS — R52 Pain, unspecified: Secondary | ICD-10-CM

## 2016-03-13 DIAGNOSIS — R609 Edema, unspecified: Secondary | ICD-10-CM | POA: Diagnosis not present

## 2016-03-13 DIAGNOSIS — M79604 Pain in right leg: Secondary | ICD-10-CM | POA: Insufficient documentation

## 2016-03-13 DIAGNOSIS — R2241 Localized swelling, mass and lump, right lower limb: Secondary | ICD-10-CM | POA: Insufficient documentation

## 2016-03-13 DIAGNOSIS — M7989 Other specified soft tissue disorders: Secondary | ICD-10-CM | POA: Insufficient documentation

## 2016-03-13 NOTE — Progress Notes (Signed)
Today's right lower extremity venous duplex is negative for DVT. 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

## 2016-04-22 ENCOUNTER — Other Ambulatory Visit: Payer: Self-pay | Admitting: Emergency Medicine

## 2016-05-06 IMAGING — CR DG CHEST 2V
2 series · 2 of 2 positions shown · non-contrast
Comparison: 06/03/2011

CLINICAL DATA: Sepsis, yellow sputum in, productive cough

EXAM:
CHEST  2 VIEW

[w chest pa]
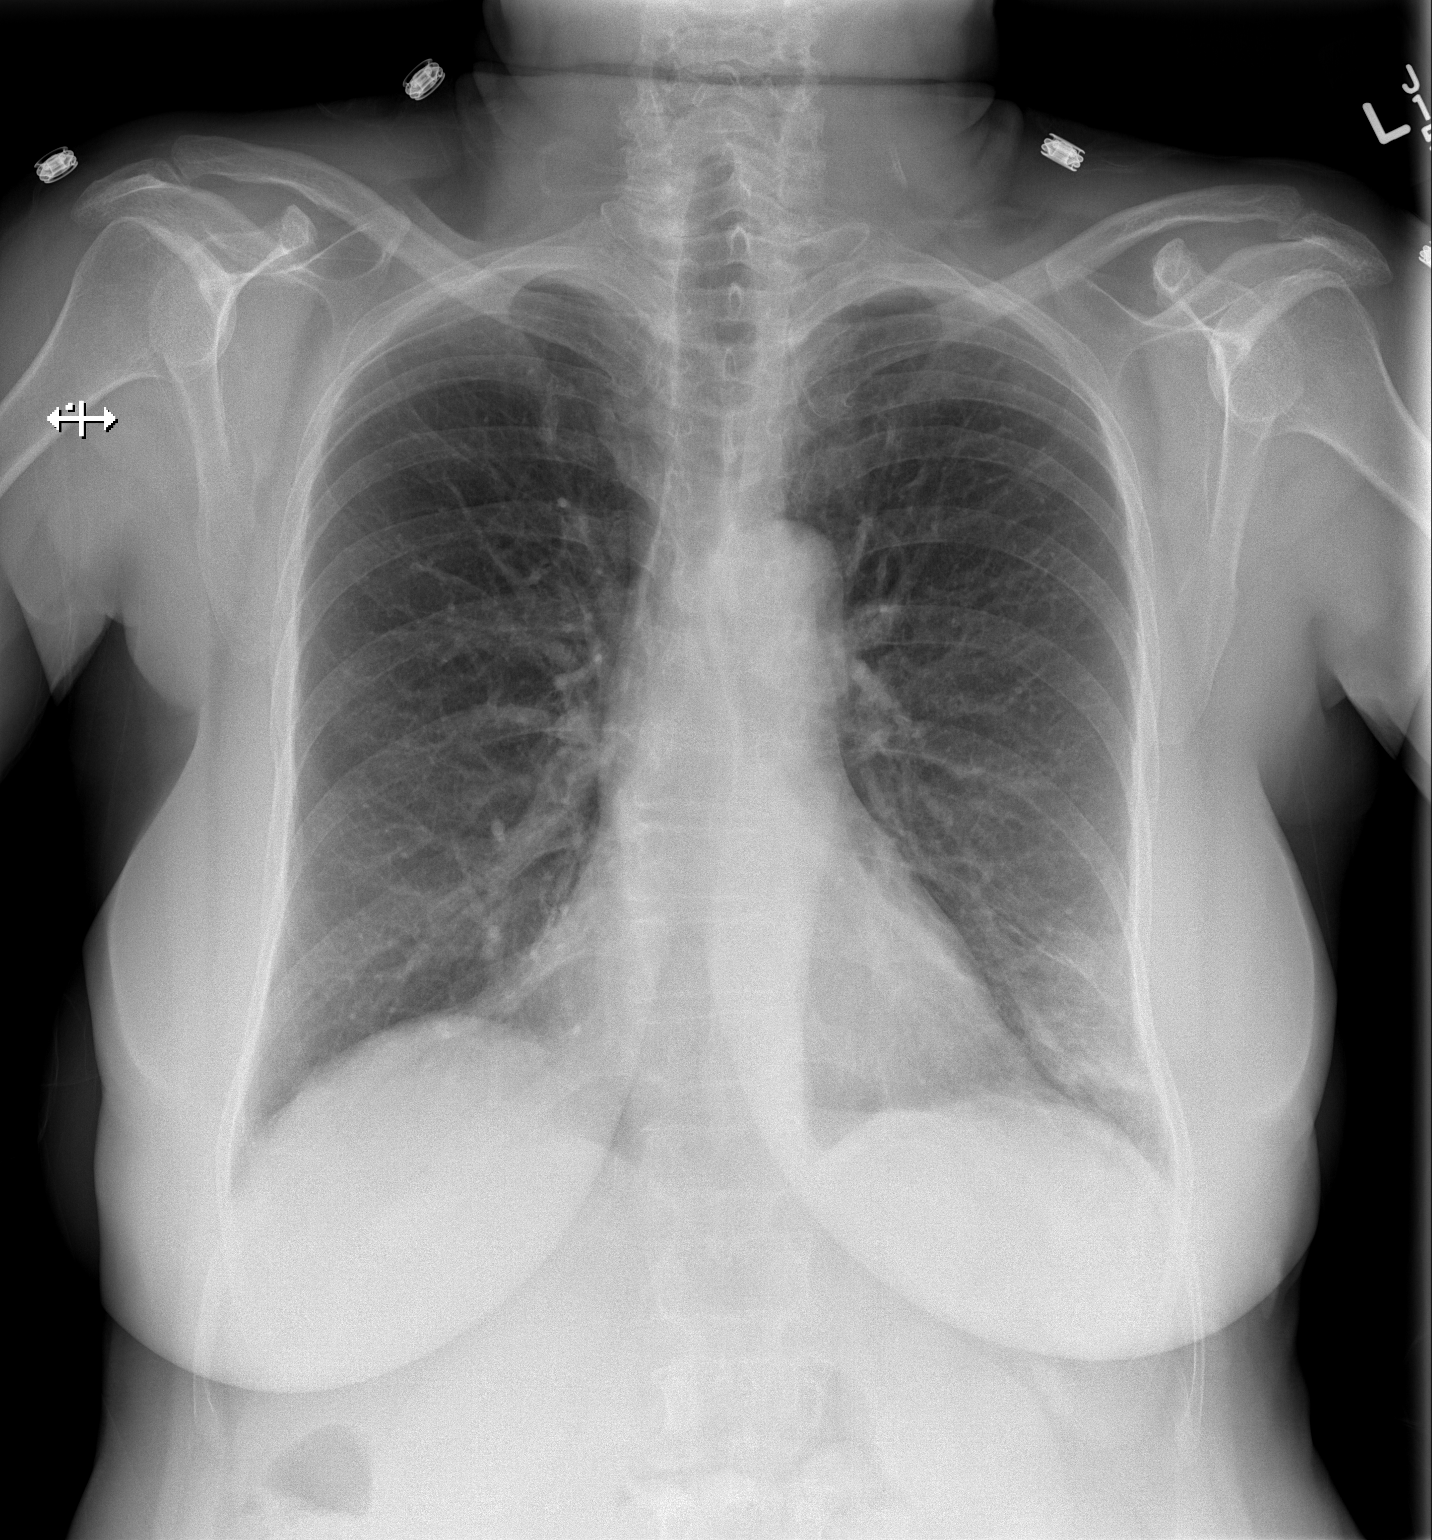

[w chest lat]
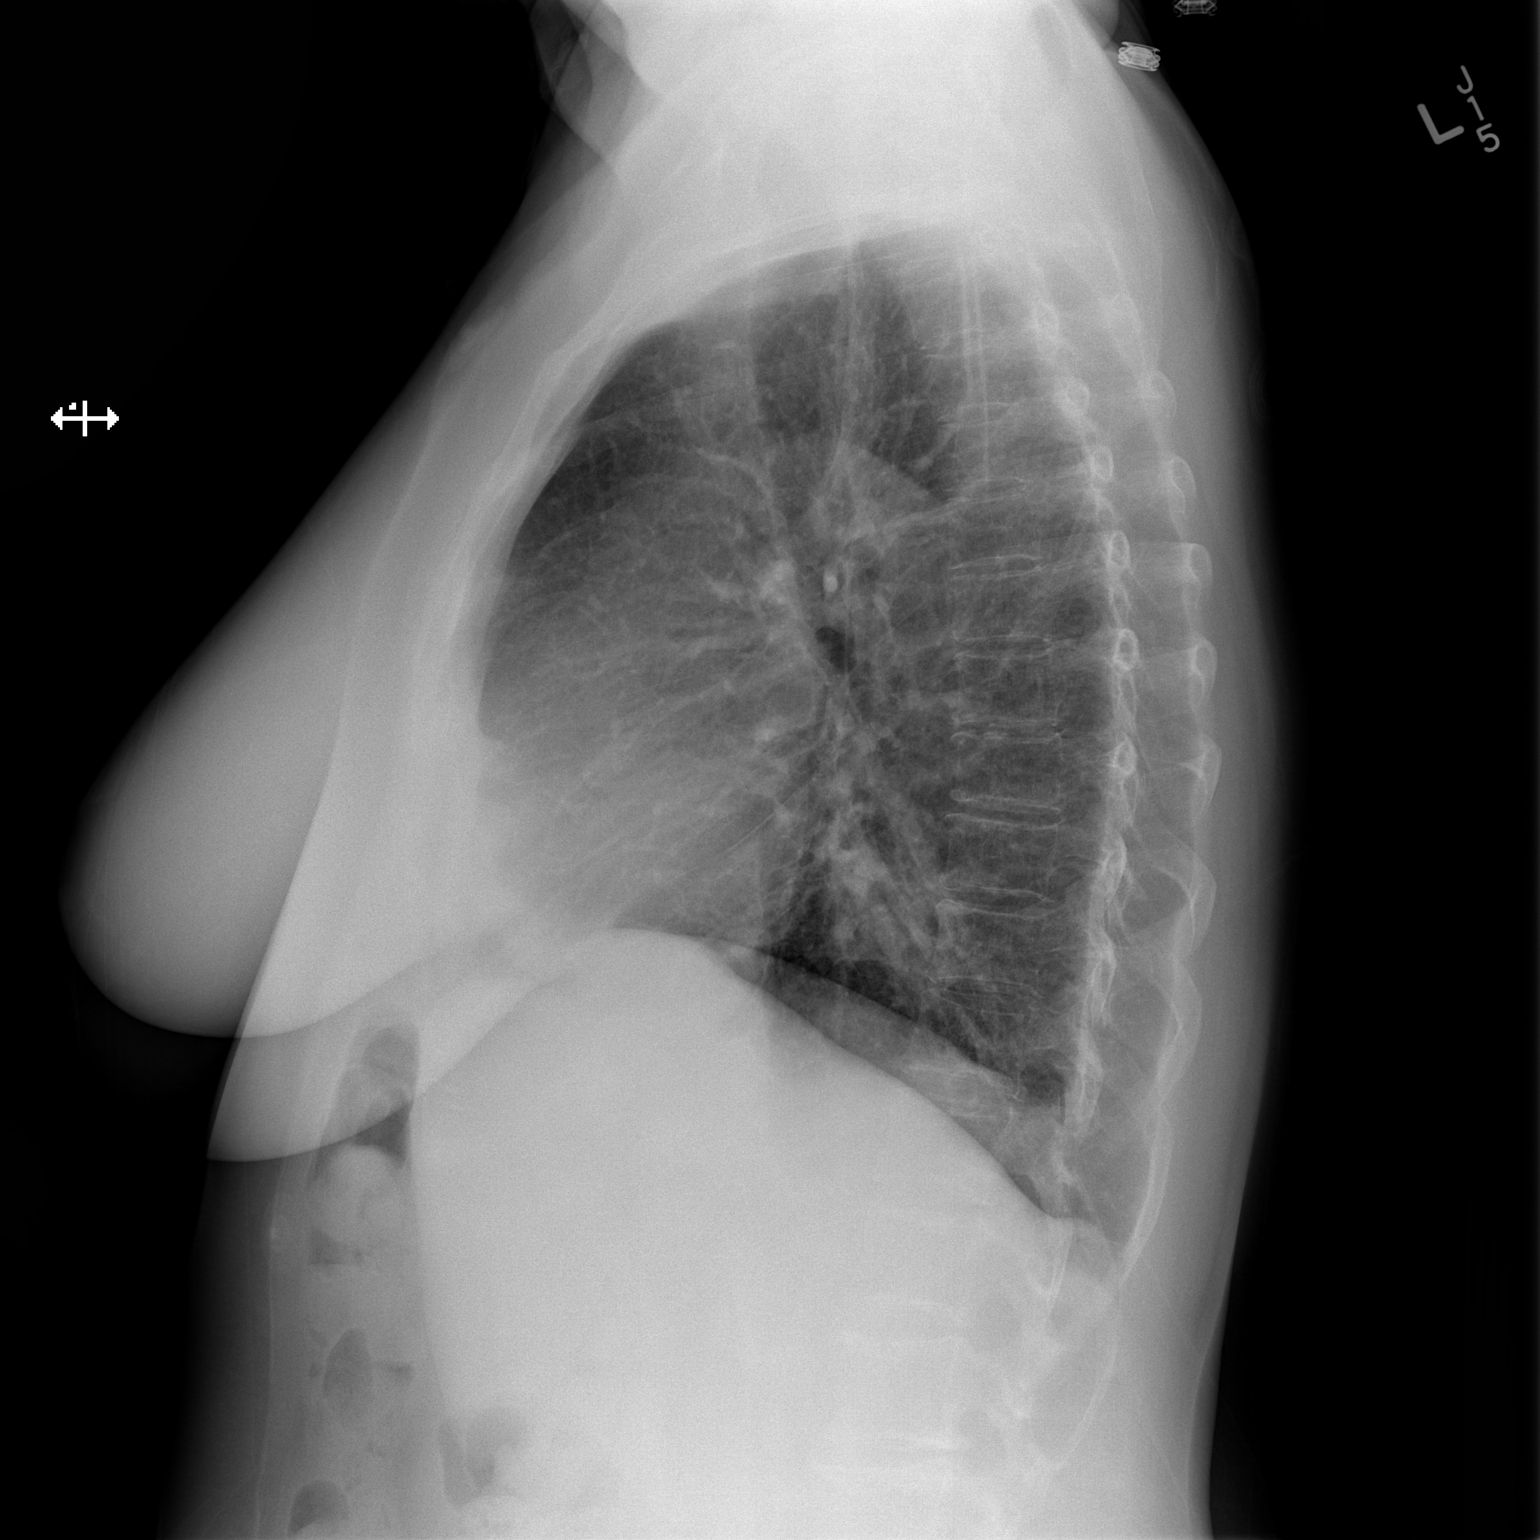

[2 of 2 positions shown; findings below may reference images not displayed]

FINDINGS: There is left lower lobe airspace disease concerning for pneumonia.
There is no other focal consolidation. There is no pleural effusion
or pneumothorax. The heart and mediastinal contours are
unremarkable.

The osseous structures are unremarkable.
IMPRESSION: Left lower lobe pneumonia. Followup PA and lateral chest X-ray is
recommended in 3-4 weeks following trial of antibiotic therapy to
ensure resolution and exclude underlying malignancy.

## 2016-05-06 IMAGING — CT CT ABD-PELV W/ CM
2 of 5 series · 16 of 46 positions shown, 18 images · IV contrast (OMNIPAQUE 300)
Comparison: 04/07/2010 and chest x-ray today

CLINICAL DATA: Mid abdominal pain starting yesterday, cough,
history of colitis

EXAM:
CT ABDOMEN AND PELVIS WITH CONTRAST
TECHNIQUE: Multidetector CT imaging of the abdomen and pelvis was performed
using the standard protocol following bolus administration of
intravenous contrast.
CONTRAST:  50mL OMNIPAQUE IOHEXOL 300 MG/ML SOLN, 100mL OMNIPAQUE
IOHEXOL 300 MG/ML SOLN

[Series 2: abd/pel with · axial · 0.74mm/px · z∈[-46,+374]mm · 13 of 94 slices shown, 15 images]
[im 5/94  soft-tissue]
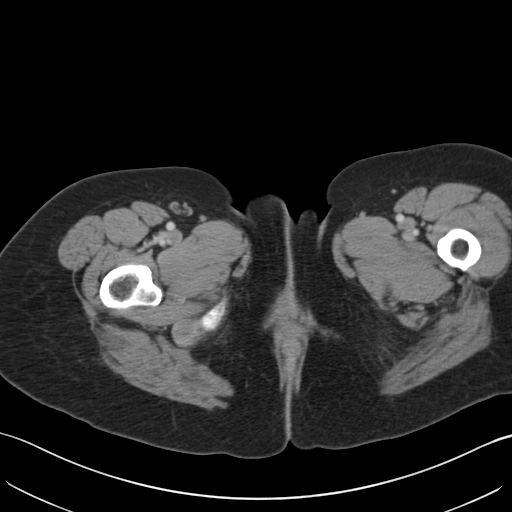
[im 5/94  bone]
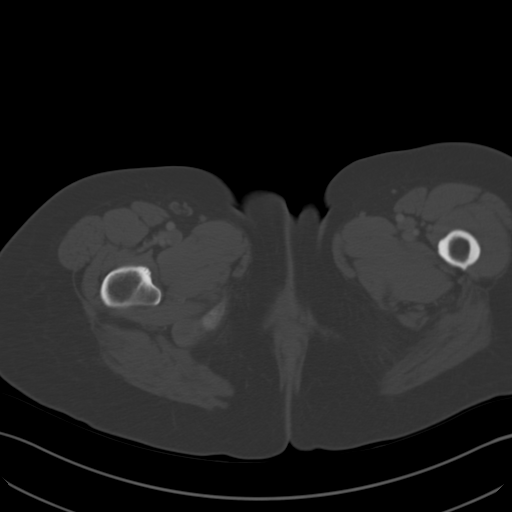
[im 15/94  soft-tissue]
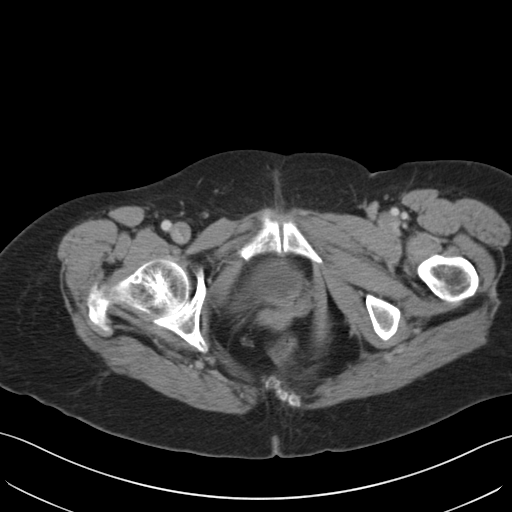
[im 20/94  soft-tissue]
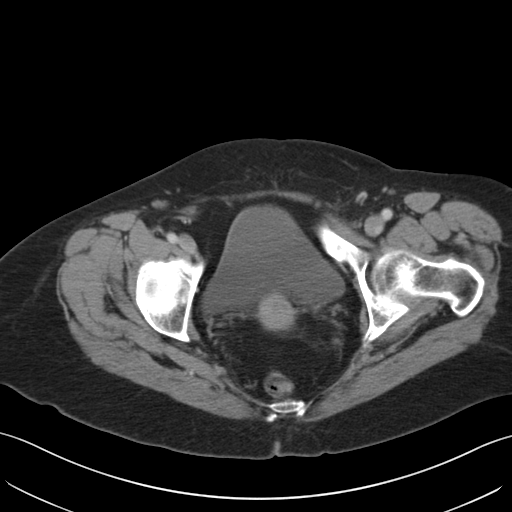
[im 25/94  soft-tissue]
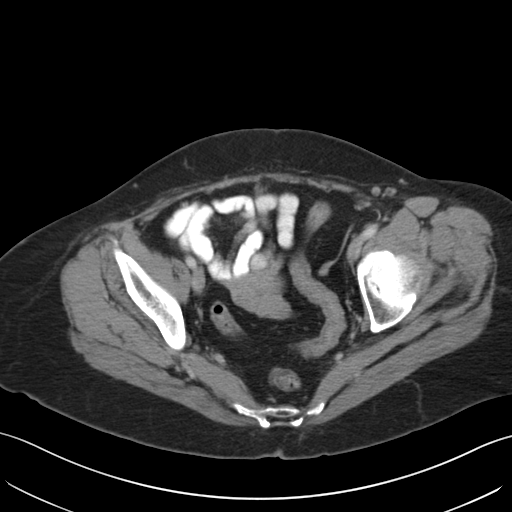
[im 35/94  soft-tissue]
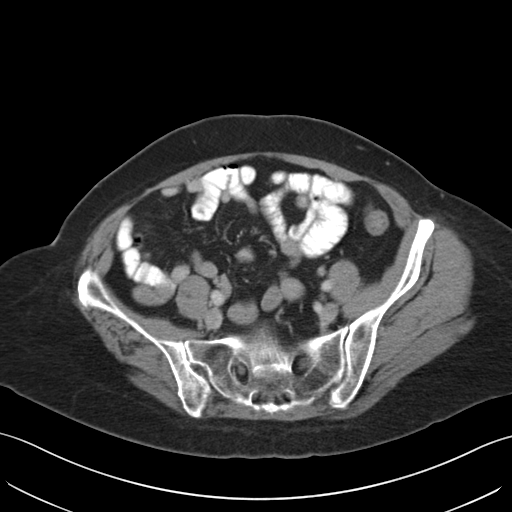
[im 40/94  soft-tissue]
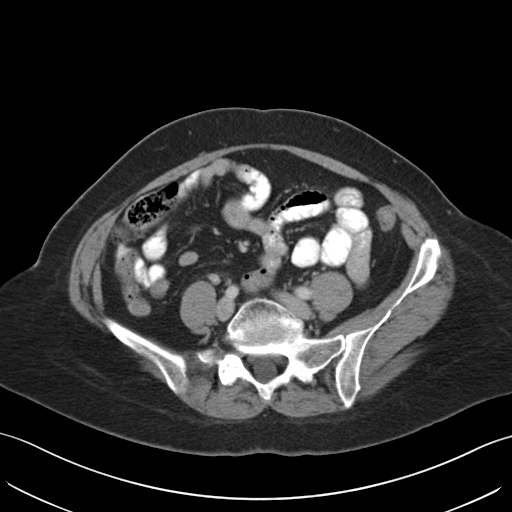
[im 49/94  soft-tissue]
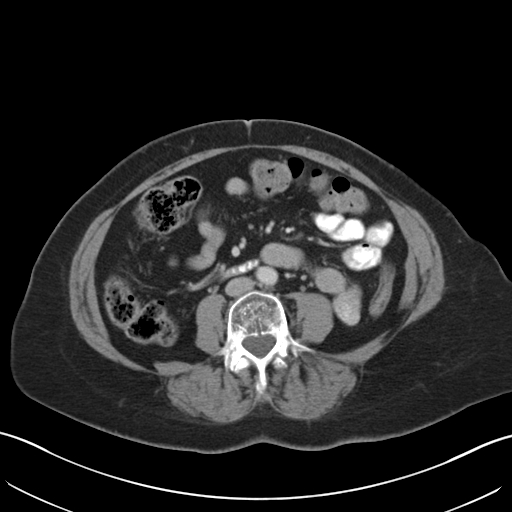
[im 54/94  soft-tissue]
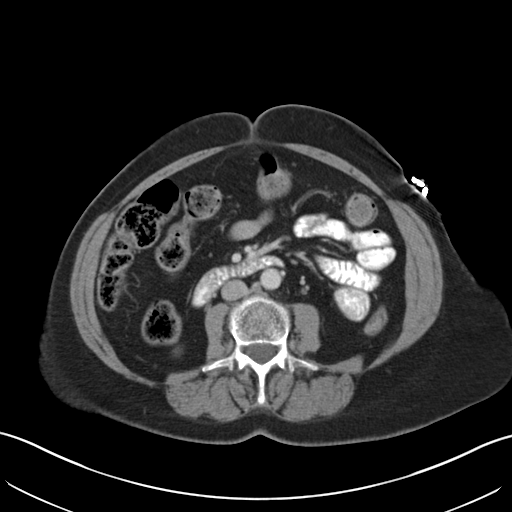
[im 59/94  soft-tissue]
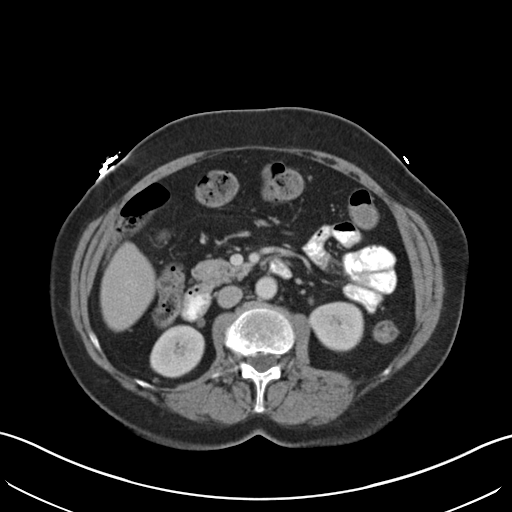
[im 59/94  bone]
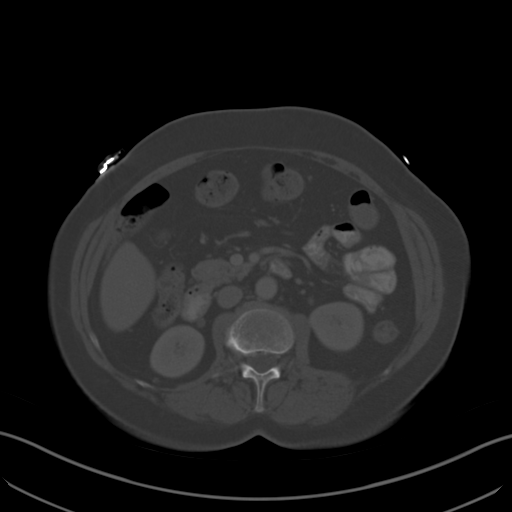
[im 69/94  soft-tissue]
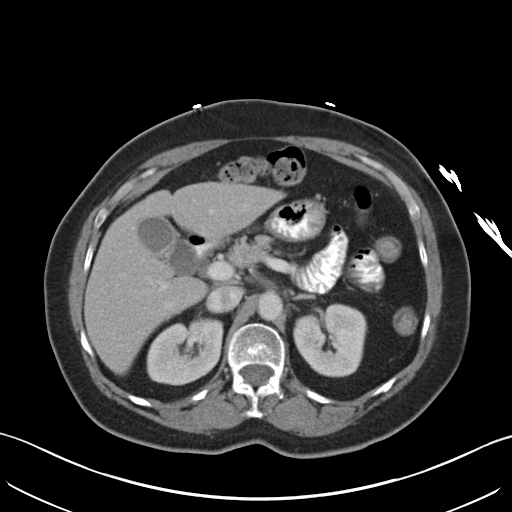
[im 74/94  soft-tissue]
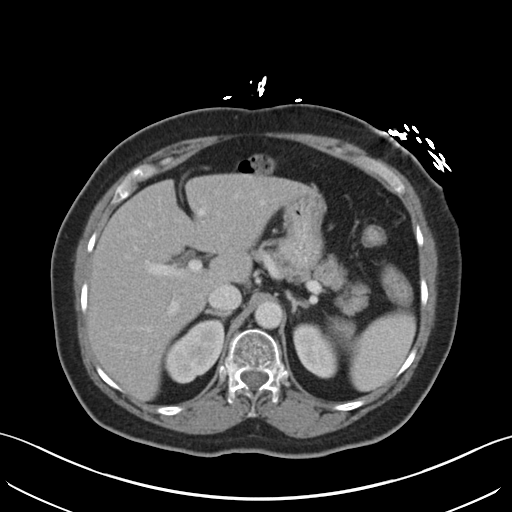
[im 79/94  soft-tissue]
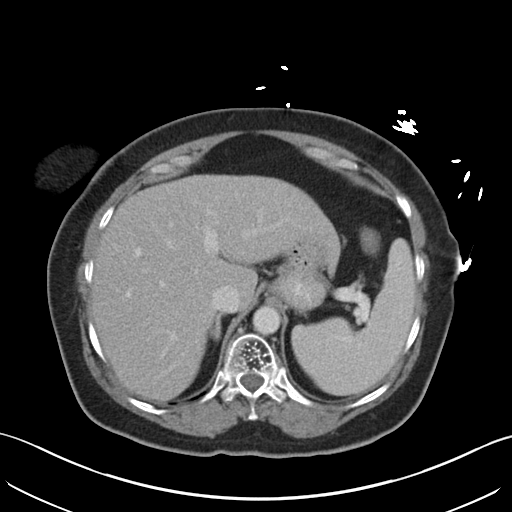
[im 89/94  soft-tissue]
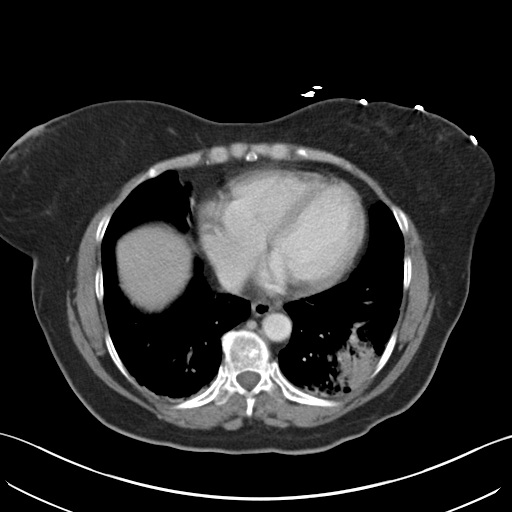

[Series 5: coronal a/|p · coronal · 0.74mm/px · 3 of 87 slices shown]
[im 29/87  soft-tissue]
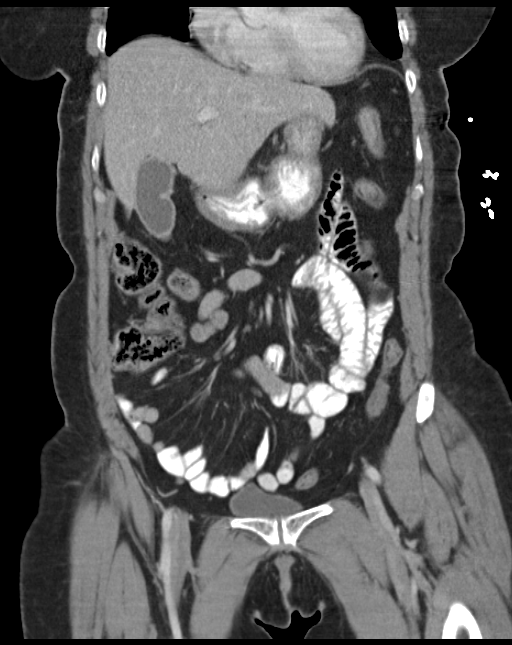
[im 39/87  soft-tissue]
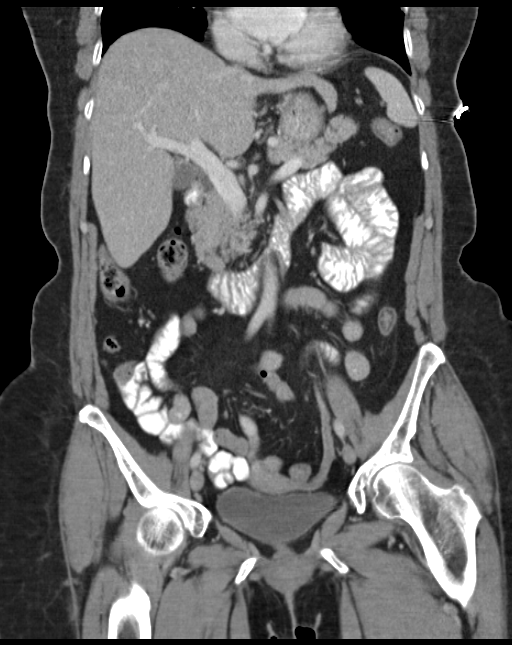
[im 48/87  soft-tissue]
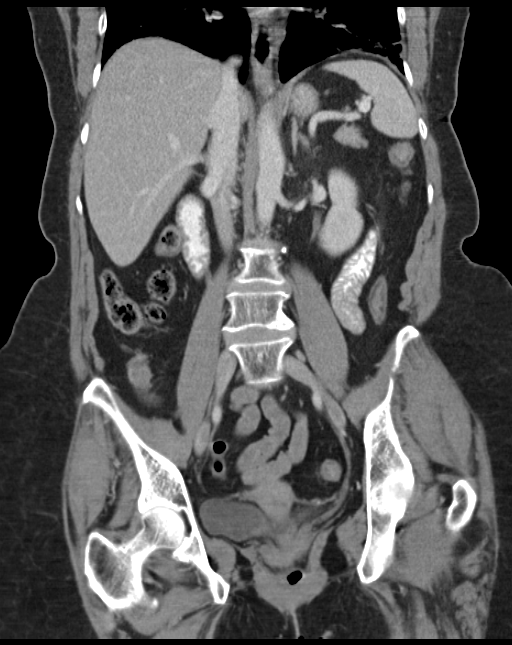

[16 of 46 positions shown; findings below may reference images not displayed]

FINDINGS: As noted on today chest x-ray there is infiltrate/ pneumonia with
air bronchogram in left lower lobe posterior laterally.

Sagittal images of the spine shows diffuse osteopenia. Stable mild
compression deformity upper endplate of L3 vertebral body.

Small hiatal hernia is noted. Enhanced liver is unremarkable. Again
noted a gallbladder septum. Small layering calcified gallstones are
noted gallbladder fundal region.

Pancreas, spleen and adrenal glands are unremarkable. Kidneys are
symmetrical in size and enhancement. No hydronephrosis or
hydroureter. No small bowel obstruction. No ascites or free air. No
adenopathy. Moderate stool noted in right colon and proximal
transverse colon. The terminal ileum is unremarkable. Normal
appendix. No pericecal inflammation.

The uterus and adnexa are unremarkable. The urinary bladder is
unremarkable. No pelvic ascites or adenopathy. No inguinal
adenopathy. No distal colonic obstruction. No colitis or
diverticulitis.

Delayed renal images shows bilateral renal symmetrical excretion.
Bilateral visualized proximal ureter is unremarkable.
IMPRESSION: 1. As noted on today chest x-ray there is infiltrate/pneumonia in
left lower lobe posterior laterally.
2. Osteopenia noted thoracolumbar spine. Stable mild compression
deformity upper endplate of L3 vertebral body.
3. No acute inflammatory process within abdomen or pelvis.
4. Again noted a septation within gallbladder. Small layering
calcified gallstones are noted within gallbladder fundus. No
pericholecystic fluid.
5. No small bowel obstruction.
6. No pericecal inflammation.  Normal appendix.
7. Unremarkable uterus and adnexal.

## 2016-10-19 ENCOUNTER — Telehealth: Payer: Self-pay | Admitting: Family Medicine

## 2016-12-05 ENCOUNTER — Other Ambulatory Visit: Payer: Self-pay

## 2016-12-05 ENCOUNTER — Ambulatory Visit (INDEPENDENT_AMBULATORY_CARE_PROVIDER_SITE_OTHER): Payer: 59 | Admitting: Emergency Medicine

## 2016-12-05 ENCOUNTER — Encounter: Payer: Self-pay | Admitting: Emergency Medicine

## 2016-12-05 VITALS — BP 139/73 | HR 74 | Temp 98.2°F | Resp 16 | Ht 60.25 in | Wt 158.8 lb

## 2016-12-05 DIAGNOSIS — R399 Unspecified symptoms and signs involving the genitourinary system: Secondary | ICD-10-CM | POA: Diagnosis not present

## 2016-12-05 DIAGNOSIS — N390 Urinary tract infection, site not specified: Secondary | ICD-10-CM | POA: Diagnosis not present

## 2016-12-05 DIAGNOSIS — R109 Unspecified abdominal pain: Secondary | ICD-10-CM

## 2016-12-05 DIAGNOSIS — I10 Essential (primary) hypertension: Secondary | ICD-10-CM | POA: Diagnosis not present

## 2016-12-05 LAB — POCT URINALYSIS DIP (MANUAL ENTRY)
BILIRUBIN UA: NEGATIVE
GLUCOSE UA: NEGATIVE mg/dL
Ketones, POC UA: NEGATIVE mg/dL
NITRITE UA: NEGATIVE
Protein Ur, POC: NEGATIVE mg/dL
Spec Grav, UA: <=1.005 — AB (ref 1.010–1.025)
Urobilinogen, UA: 0.2 E.U./dL
pH, UA: 6.5 (ref 5.0–8.0)

## 2016-12-05 MED ORDER — CIPROFLOXACIN HCL 500 MG PO TABS: 500.0000 mg | ORAL_TABLET | Freq: Two times a day (BID) | ORAL | 0 refills | Status: DC

## 2016-12-05 MED ORDER — CIPROFLOXACIN HCL 500 MG PO TABS: 500.0000 mg | ORAL_TABLET | Freq: Two times a day (BID) | ORAL | 0 refills | Status: AC

## 2016-12-05 NOTE — Progress Notes (Signed)
Jessica Stephens 66 y.o.   Chief Complaint  Patient presents with  . Back Pain    lower - pain  . Urinary Frequency    x 3 days    HISTORY OF PRESENT ILLNESS: This is a 66 y.o. female complaining of low back pain and urinary frequency x 3 days.  Urinary Tract Infection   This is a new problem. The current episode started in the past 7 days. The problem occurs every urination. The problem has been gradually worsening. The quality of the pain is described as aching and burning. The pain is mild. There has been no fever. She is sexually active. There is no history of pyelonephritis. Associated symptoms include flank pain, frequency, nausea and urgency. Pertinent negatives include no chills, discharge, hematuria or vomiting. She has tried nothing for the symptoms. Her past medical history is significant for recurrent UTIs.     Prior to Admission medications   Medication Sig Start Date End Date Taking? Authorizing Provider  amLODipine (NORVASC) 10 MG tablet TAKE ONE TABLET DAILY FOR BLOOD PRESSURE. 01/20/16   Shawnee Knapp, MD  atorvastatin (LIPITOR) 20 MG tablet Take 1 tablet (20 mg total) by mouth daily. 01/20/16   Shawnee Knapp, MD  cetirizine (ZYRTEC ALLERGY) 10 MG tablet Take 1 tablet (10 mg total) by mouth daily. 01/27/14   Robyn Haber, MD  esomeprazole (NEXIUM) 20 MG capsule Take 20 mg by mouth daily as needed (indigestion).     [provider]  HYDROcodone-acetaminophen (NORCO/VICODIN) 5-325 MG tablet Take 1-2 tablets by mouth every 4 (four) hours as needed for moderate pain. 01/20/16   Shawnee Knapp, MD  ibuprofen (ADVIL,MOTRIN) 600 MG tablet Take 1 tablet (600 mg total) by mouth 3 (three) times daily. 06/26/15   Larene Pickett, PA-C  losartan (COZAAR) 50 MG tablet Take 1 tablet (50 mg total) by mouth daily. 01/20/16   Shawnee Knapp, MD  meloxicam (MOBIC) 7.5 MG tablet Take 1 tablet (7.5 mg total) by mouth daily. 01/20/16   Shawnee Knapp, MD  methocarbamol (ROBAXIN) 500 MG  tablet Take 1 tablet (500 mg total) by mouth 4 (four) times daily. 01/20/16   Shawnee Knapp, MD  Multiple Vitamins-Minerals (VISION VITAMINS) TABS Take 1 tablet by mouth daily.    [provider]    Allergies  Allergen Reactions  . Alendronate Sodium   . Sulfonamide Derivatives     Patient Active Problem List   Diagnosis Date Noted  . Prolonged Q-T interval on ECG 08/21/2014  . Lobar pneumonia due to unspecified organism 08/21/2014  . CAP (community acquired pneumonia) 08/19/2014  . Community acquired pneumonia 08/19/2014  . Sepsis ([place]) 08/19/2014  . Hypokalemia 08/19/2014  . HYPERLIPIDEMIA 05/29/2010  . DYSPNEA 05/29/2010  . CHEST PAIN, ATYPICAL 05/02/2010  . ESOPHAGEAL STRICTURE 04/07/2010  . HIATAL HERNIA 04/07/2010  . ULCERATIVE PROCTITIS 04/07/2010  . ARTHRITIS 04/07/2010  . LUQ PAIN 04/07/2010  . TRANSAMINASES, SERUM, ELEVATED 04/07/2010  . VITAMIN B12 DEFICIENCY 01/30/2008  . ANXIETY, CHRONIC 01/29/2008  . Essential hypertension 01/29/2008  . CHRONIC MAXILLARY SINUSITIS 04/24/2007  . G E R D 04/24/2007  . UNSPECIFIED OSTEOPOROSIS 04/24/2007  . ULCERATIVE COLITIS, LEFT SIDED 02/25/2007    Past Medical History:  Diagnosis Date  . Abnormal transaminases   . Anxiety   . Arthritis   . Esophageal stricture   . GERD (gastroesophageal reflux disease)   . Hiatal hernia   . Hyperlipidemia   . Hypertension   . Maxillary  sinusitis   . Proctitis   . Ulcerative colitis ([place])    left sided  . Vitamin B12 deficiency     Past Surgical History:  Procedure Laterality Date  . CESAREAN SECTION    . nose surg    . NOSE SURGERY      Social History   Social History  . Marital status: Married    Spouse name: N/A  . Number of children: N/A  . Years of education: N/A   Occupational History  . Not on file.   Social History Main Topics  . Smoking status: Never Smoker  . Smokeless tobacco: Never Used  . Alcohol use No  . Drug use: No  . Sexual activity:  Yes    Birth control/ protection: None   Other Topics Concern  . Not on file   Social History Narrative  . No narrative on file    Family History  Problem Relation Age of Onset  . Arthritis Mother   . Dementia Mother   . Diabetes Father   . Hyperlipidemia Father   . Hypertension Father      Review of Systems  Constitutional: Negative.  Negative for chills, fever and malaise/fatigue.  HENT: Negative.   Eyes: Negative.   Respiratory: Negative for cough and shortness of breath.   Cardiovascular: Negative for chest pain and palpitations.  Gastrointestinal: Positive for nausea. Negative for abdominal pain, blood in stool, diarrhea, melena and vomiting.  Genitourinary: Positive for dysuria, flank pain, frequency and urgency. Negative for hematuria.  Musculoskeletal: Positive for back pain.  Skin: Negative.  Negative for rash.  Neurological: Negative.  Negative for dizziness and headaches.  Endo/Heme/Allergies: Negative.   All other systems reviewed and are negative.   Vitals:   12/05/16 0951  BP: 139/73  Pulse: 74  Resp: 16  Temp: 98.2 F (36.8 C)  SpO2: 98%    Physical Exam  Constitutional: She is oriented to person, place, and time. She appears well-developed and well-nourished.  HENT:  Head: Normocephalic and atraumatic.  Nose: Nose normal.  Mouth/Throat: Oropharynx is clear and moist.  Eyes: Pupils are equal, round, and reactive to light. Conjunctivae and EOM are normal.  Neck: Normal range of motion. Neck supple.  Cardiovascular: Normal rate, regular rhythm and normal heart sounds.   Pulmonary/Chest: Effort normal and breath sounds normal.  Abdominal: Soft. Bowel sounds are normal. She exhibits no distension. There is no tenderness.  Musculoskeletal: Normal range of motion.  Neurological: She is alert and oriented to person, place, and time.  Skin: Skin is warm and dry. Capillary refill takes less than 2 seconds. No rash noted.  Psychiatric: She has a normal  mood and affect. Her behavior is normal.  Vitals reviewed.  Results for orders placed or performed in visit on 12/05/16 (from the past 24 hour(s))  POCT urinalysis dipstick     Status: Abnormal   Collection Time: 12/05/16 10:08 AM  Result Value Ref Range   Color, UA yellow yellow   Clarity, UA cloudy (A) clear   Glucose, UA negative negative mg/dL   Bilirubin, UA negative negative   Ketones, POC UA negative negative mg/dL   Spec Grav, UA <=1.005 (A) 1.010 - 1.025   Blood, UA moderate (A) negative   pH, UA 6.5 5.0 - 8.0   Protein Ur, POC negative negative mg/dL   Urobilinogen, UA 0.2 0.2 or 1.0 E.U./dL   Nitrite, UA Negative Negative   Leukocytes, UA Large (3+) (A) Negative     ASSESSMENT &  PLAN: Jessica Stephens was seen today for back pain and urinary frequency.  Diagnoses and all orders for this visit:  Acute UTI  Lower urinary tract symptoms (LUTS) -     POCT urinalysis dipstick -     Urine Culture  Flank pain, acute  Essential hypertension  Other orders -     Discontinue: ciprofloxacin (CIPRO) 500 MG tablet; Take 1 tablet (500 mg total) by mouth 2 (two) times daily.    Patient Instructions  Urinary Tract Infection, Adult A urinary tract infection (UTI) is an infection of any part of the urinary tract. The urinary tract includes the:  Kidneys.  Ureters.  Bladder.  Urethra.  These organs make, store, and get rid of pee (urine) in the body. Follow these instructions at home:  Take over-the-counter and prescription medicines only as told by your doctor.  If you were prescribed an antibiotic medicine, take it as told by your doctor. Do not stop taking the antibiotic even if you start to feel better.  Avoid the following drinks: ? Alcohol. ? Caffeine. ? Tea. ? Carbonated drinks.  Drink enough fluid to keep your pee clear or pale yellow.  Keep all follow-up visits as told by your doctor. This is important.  Make sure to: ? Empty your bladder often and  completely. Do not to hold pee for long periods of time. ? Empty your bladder before and after sex. ? Wipe from front to back after a bowel movement if you are female. Use each tissue one time when you wipe. Contact a doctor if:  You have back pain.  You have a fever.  You feel sick to your stomach (nauseous).  You throw up (vomit).  Your symptoms do not get better after 3 days.  Your symptoms go away and then come back. Get help right away if:  You have very bad back pain.  You have very bad lower belly (abdominal) pain.  You are throwing up and cannot keep down any medicines or water. This information is not intended to replace advice given to you by your health care provider. Make sure you discuss any questions you have with your health care provider. Document Released: 09/05/2007 Document Revised: 08/25/2015 Document Reviewed: 02/07/2015 Elsevier Interactive Patient Education  2018 [employer]      Agustina Caroli, MD [facility]

## 2016-12-05 NOTE — Patient Instructions (Signed)

## 2016-12-07 ENCOUNTER — Telehealth: Payer: Self-pay | Admitting: Family Medicine

## 2016-12-07 ENCOUNTER — Other Ambulatory Visit: Payer: Self-pay

## 2016-12-07 ENCOUNTER — Encounter: Payer: Self-pay | Admitting: Family Medicine

## 2016-12-07 ENCOUNTER — Ambulatory Visit (INDEPENDENT_AMBULATORY_CARE_PROVIDER_SITE_OTHER): Payer: 59 | Admitting: Family Medicine

## 2016-12-07 VITALS — BP 146/76 | HR 74 | Temp 97.9°F | Resp 17 | Ht 60.5 in | Wt 158.0 lb

## 2016-12-07 DIAGNOSIS — E785 Hyperlipidemia, unspecified: Secondary | ICD-10-CM | POA: Diagnosis not present

## 2016-12-07 DIAGNOSIS — M810 Age-related osteoporosis without current pathological fracture: Secondary | ICD-10-CM

## 2016-12-07 DIAGNOSIS — I10 Essential (primary) hypertension: Secondary | ICD-10-CM

## 2016-12-07 DIAGNOSIS — Z0000 Encounter for general adult medical examination without abnormal findings: Secondary | ICD-10-CM | POA: Diagnosis not present

## 2016-12-07 DIAGNOSIS — N8110 Cystocele, unspecified: Secondary | ICD-10-CM

## 2016-12-07 DIAGNOSIS — Z1231 Encounter for screening mammogram for malignant neoplasm of breast: Secondary | ICD-10-CM

## 2016-12-07 DIAGNOSIS — Z124 Encounter for screening for malignant neoplasm of cervix: Secondary | ICD-10-CM | POA: Diagnosis not present

## 2016-12-07 DIAGNOSIS — Z1239 Encounter for other screening for malignant neoplasm of breast: Secondary | ICD-10-CM

## 2016-12-07 LAB — URINE CULTURE

## 2016-12-07 MED ORDER — AMLODIPINE BESYLATE 10 MG PO TABS: ORAL_TABLET | ORAL | 3 refills | Status: DC

## 2016-12-07 MED ORDER — LOSARTAN POTASSIUM 50 MG PO TABS: 50.0000 mg | ORAL_TABLET | Freq: Every day | ORAL | 3 refills | Status: DC

## 2016-12-07 MED ORDER — ATORVASTATIN CALCIUM 20 MG PO TABS: 20.0000 mg | ORAL_TABLET | Freq: Every day | ORAL | 3 refills | Status: DC

## 2016-12-07 NOTE — Patient Instructions (Addendum)
Urine infection should continue to improve - return if those symptoms do not improve with current antibiotic, or if return. I would recommend following up with gynecology to discuss cystocoele and plan. Please call to schedule appointment with OBGYN. If they need a referral, let me know. Depending on the results of your Pap test today, that may need to be repeated with OB/GYN.  I will refer you for bone density.  Make sure you are obtaining at least 1200-1534m calcium per day, and vitamin D [phone] units per day.   I will refer you for a mammogram.   Keep a record of your blood pressures outside of the office and if those readings are remaining over 140/90 - return to discuss changes.   I will check cholesterol.   Avoid Q-tips and other objects to your ears. If the outside of the ear itches, you can apply Aveeno lotion. If that continues, please return so we can discuss other causes or treatments.  Thank you for coming in today. If there are other questions or concerns we did not have a chance to cover, please schedule another appointment and I'll be happy to review those.   Hypertension Hypertension, commonly called high blood pressure, is when the force of blood pumping through the arteries is too strong. The arteries are the blood vessels that carry blood from the heart throughout the body. Hypertension forces the heart to work harder to pump blood and may cause arteries to become narrow or stiff. Having untreated or uncontrolled hypertension can cause heart attacks, strokes, kidney disease, and other problems. A blood pressure reading consists of a higher number over a lower number. Ideally, your blood pressure should be below 120/80. The first ("top") number is called the systolic pressure. It is a measure of the pressure in your arteries as your heart beats. The second ("bottom") number is called the diastolic pressure. It is a measure of the pressure in your arteries as the heart  relaxes. What are the causes? The cause of this condition is not known. What increases the risk? Some risk factors for high blood pressure are under your control. Others are not. Factors you can change  Smoking.  Having type 2 diabetes mellitus, high cholesterol, or both.  Not getting enough exercise or physical activity.  Being overweight.  Having too much fat, sugar, calories, or salt (sodium) in your diet.  Drinking too much alcohol. Factors that are difficult or impossible to change  Having chronic kidney disease.  Having a family history of high blood pressure.  Age. Risk increases with age.  Race. You may be at higher risk if you are African-American.  Gender. Men are at higher risk than women before age 178 After age 64 women are at higher risk than men.  Having obstructive sleep apnea.  Stress. What are the signs or symptoms? Extremely high blood pressure (hypertensive crisis) may cause:  Headache.  Anxiety.  Shortness of breath.  Nosebleed.  Nausea and vomiting.  Severe chest pain.  Jerky movements you cannot control (seizures).  How is this diagnosed? This condition is diagnosed by measuring your blood pressure while you are seated, with your arm resting on a surface. The cuff of the blood pressure monitor will be placed directly against the skin of your upper arm at the level of your heart. It should be measured at least twice using the same arm. Certain conditions can cause a difference in blood pressure between your right and left arms. Certain factors can  cause blood pressure readings to be lower or higher than normal (elevated) for a short period of time:  When your blood pressure is higher when you are in a health care provider's office than when you are at home, this is called white coat hypertension. Most people with this condition do not need medicines.  When your blood pressure is higher at home than when you are in a health care provider's  office, this is called masked hypertension. Most people with this condition may need medicines to control blood pressure.  If you have a high blood pressure reading during one visit or you have normal blood pressure with other risk factors:  You may be asked to return on a different day to have your blood pressure checked again.  You may be asked to monitor your blood pressure at home for 1 week or longer.  If you are diagnosed with hypertension, you may have other blood or imaging tests to help your health care provider understand your overall risk for other conditions. How is this treated? This condition is treated by making healthy lifestyle changes, such as eating healthy foods, exercising more, and reducing your alcohol intake. Your health care provider may prescribe medicine if lifestyle changes are not enough to get your blood pressure under control, and if:  Your systolic blood pressure is above 130.  Your diastolic blood pressure is above 80.  Your personal target blood pressure may vary depending on your medical conditions, your age, and other factors. Follow these instructions at home: Eating and drinking  Eat a diet that is high in fiber and potassium, and low in sodium, added sugar, and fat. An example eating plan is called the DASH (Dietary Approaches to Stop Hypertension) diet. To eat this way: ? Eat plenty of fresh fruits and vegetables. Try to fill half of your plate at each meal with fruits and vegetables. ? Eat whole grains, such as whole wheat pasta, brown rice, or whole grain bread. Fill about one quarter of your plate with whole grains. ? Eat or drink low-fat dairy products, such as skim milk or low-fat yogurt. ? Avoid fatty cuts of meat, processed or cured meats, and poultry with skin. Fill about one quarter of your plate with lean proteins, such as fish, chicken without skin, beans, eggs, and tofu. ? Avoid premade and processed foods. These tend to be higher in  sodium, added sugar, and fat.  Reduce your daily sodium intake. Most people with hypertension should eat less than 1,500 mg of sodium a day.  Limit alcohol intake to no more than 1 drink a day for nonpregnant women and 2 drinks a day for men. One drink equals 12 oz of beer, 5 oz of wine, or 1 oz of hard liquor. Lifestyle  Work with your health care provider to maintain a healthy body weight or to lose weight. Ask what an ideal weight is for you.  Get at least 30 minutes of exercise that causes your heart to beat faster (aerobic exercise) most days of the week. Activities may include walking, swimming, or biking.  Include exercise to strengthen your muscles (resistance exercise), such as pilates or lifting weights, as part of your weekly exercise routine. Try to do these types of exercises for 30 minutes at least 3 days a week.  Do not use any products that contain nicotine or tobacco, such as cigarettes and e-cigarettes. If you need help quitting, ask your health care provider.  Monitor your blood pressure at  home as told by your health care provider.  Keep all follow-up visits as told by your health care provider. This is important. Medicines  Take over-the-counter and prescription medicines only as told by your health care provider. Follow directions carefully. Blood pressure medicines must be taken as prescribed.  Do not skip doses of blood pressure medicine. Doing this puts you at risk for problems and can make the medicine less effective.  Ask your health care provider about side effects or reactions to medicines that you should watch for. Contact a health care provider if:  You think you are having a reaction to a medicine you are taking.  You have headaches that keep coming back (recurring).  You feel dizzy.  You have swelling in your ankles.  You have trouble with your vision. Get help right away if:  You develop a severe headache or confusion.  You have unusual  weakness or numbness.  You feel faint.  You have severe pain in your chest or abdomen.  You vomit repeatedly.  You have trouble breathing. Summary  Hypertension is when the force of blood pumping through your arteries is too strong. If this condition is not controlled, it may put you at risk for serious complications.  Your personal target blood pressure may vary depending on your medical conditions, your age, and other factors. For most people, a normal blood pressure is less than 120/80.  Hypertension is treated with lifestyle changes, medicines, or a combination of both. Lifestyle changes include weight loss, eating a healthy, low-sodium diet, exercising more, and limiting alcohol. This information is not intended to replace advice given to you by your health care provider. Make sure you discuss any questions you have with your health care provider. Document Released: 03/19/2005 Document Revised: 02/15/2016 Document Reviewed: 02/15/2016 Elsevier Interactive Patient Education  2018 [employer].   Osteoporosis Osteoporosis is the thinning and loss of density in the bones. Osteoporosis makes the bones more brittle, fragile, and likely to break (fracture). Over time, osteoporosis can cause the bones to become so weak that they fracture after a simple fall. The bones most likely to fracture are the bones in the hip, wrist, and spine. What are the causes? The exact cause is not known. What increases the risk? Anyone can develop osteoporosis. You may be at greater risk if you have a family history of the condition or have poor nutrition. You may also have a higher risk if you are:  Female.  14 years old or older.  A smoker.  Not physically active.  White or Asian.  Slender.  What are the signs or symptoms? A fracture might be the first sign of the disease, especially if it results from a fall or injury that would not usually cause a bone to break. Other signs and symptoms  include:  Low back and neck pain.  Stooped posture.  Height loss.  How is this diagnosed? To make a diagnosis, your health care provider may:  Take a medical history.  Perform a physical exam.  Order tests, such as: ? A bone mineral density test. ? A dual-energy X-ray absorptiometry test.  How is this treated? The goal of osteoporosis treatment is to strengthen your bones to reduce your risk of a fracture. Treatment may involve:  Making lifestyle changes, such as: ? Eating a diet rich in calcium. ? Doing weight-bearing and muscle-strengthening exercises. ? Stopping tobacco use. ? Limiting alcohol intake.  Taking medicine to slow the process of bone loss or  to increase bone density.  Monitoring your levels of calcium and vitamin D.  Follow these instructions at home:  Include calcium and vitamin D in your diet. Calcium is important for bone health, and vitamin D helps the body absorb calcium.  Perform weight-bearing and muscle-strengthening exercises as directed by your health care provider.  Do not use any tobacco products, including cigarettes, chewing tobacco, and electronic cigarettes. If you need help quitting, ask your health care provider.  Limit your alcohol intake.  Take medicines only as directed by your health care provider.  Keep all follow-up visits as directed by your health care provider. This is important.  Take precautions at home to lower your risk of falling, such as: ? Keeping rooms well lit and clutter free. ? Installing safety rails on stairs. ? Using rubber mats in the bathroom and other areas that are often wet or slippery. Get help right away if: You fall or injure yourself. This information is not intended to replace advice given to you by your health care provider. Make sure you discuss any questions you have with your health care provider. Document Released: 12/27/2004 Document Revised: 08/22/2015 Document Reviewed: 08/27/2013 Elsevier  Interactive Patient Education  2017 [employer]  Get These Tests  Blood Pressure- Have your blood pressure checked by your healthcare provider at least once a year.  Normal blood pressure is 120/80.  Weight- Have your body mass index (BMI) calculated to screen for obesity.  BMI is a measure of body fat based on height and weight.  You can calculate your own BMI at [employer]  Cholesterol- Have your cholesterol checked every year.  Diabetes- Have your blood sugar checked every year if you have high blood pressure, high cholesterol, a family history of diabetes or if you are overweight.  Pap Test - Have a pap test every 1 to 5 years if you have been sexually active.  If you are older than 65 and recent pap tests have been normal you may not need additional pap tests.  In addition, if you have had a hysterectomy  for benign disease additional pap tests are not necessary.  Mammogram-Yearly mammograms are essential for early detection of breast cancer  Screening for Colon Cancer- Colonoscopy starting at age 52. Screening may begin sooner depending on your family history and other health conditions.  Follow up colonoscopy as directed by your Gastroenterologist.  Screening for Osteoporosis- Screening begins at age 67 with bone density scanning, sooner if you are at higher risk for developing Osteoporosis.  Get these medicines  Calcium with Vitamin D- Your body requires 1200-1500 mg of Calcium a day and [phone] IU of Vitamin D a day.  You can only absorb 500 mg of Calcium at a time therefore Calcium must be taken in 2 or 3 separate doses throughout the day.  Hormones- Hormone therapy has been associated with increased risk for certain cancers and heart disease.  Talk to your healthcare provider about if you need relief from menopausal symptoms.  Aspirin- Ask your healthcare provider about taking Aspirin to prevent Heart Disease and Stroke.  Get these  Immuniztions  Flu shot- Every fall  Pneumonia shot- Once after the age of 42; if you are younger ask your healthcare provider if you need a pneumonia shot.  Tetanus- Every ten years.  Zostavax- Once after the age of 26 to prevent shingles.  Take these steps  Don't smoke- Your healthcare provider can help you quit. For tips on  how to quit, ask your healthcare provider or go to www.smokefree.gov or call 1-800 QUIT-NOW.  Be physically active- Exercise 5 days a week for a minimum of 30 minutes.  If you are not already physically active, start slow and gradually work up to 30 minutes of moderate physical activity.  Try walking, dancing, bike riding, swimming, etc.  Eat a healthy diet- Eat a variety of healthy foods such as fruits, vegetables, whole grains, low fat milk, low fat cheeses, yogurt, lean meats, chicken, fish, eggs, dried beans, tofu, etc.  For more information go to www.thenutritionsource.org  Dental visit- Brush and floss teeth twice daily; visit your dentist twice a year.  Eye exam- Visit your Optometrist or Ophthalmologist yearly.  Drink alcohol in moderation- Limit alcohol intake to one drink or less a day.  Never drink and drive.  Depression- Your emotional health is as important as your physical health.  If you're feeling down or losing interest in things you normally enjoy, please talk to your healthcare provider.  Seat Belts- can save your life; always wear one  Smoke/Carbon Monoxide detectors- These detectors need to be installed on the appropriate level of your home.  Replace batteries at least once a year.  Violence- If anyone is threatening or hurting you, please tell your healthcare provider.  Living Will/ Health care power of attorney- Discuss with your healthcare provider and family.     IF you received an x-ray today, you will receive an invoice from [facility]. Please contact [facility] at [phone] with questions or concerns  regarding your invoice.   IF you received labwork today, you will receive an invoice from [facility]. Please contact [facility] at [phone] with questions or concerns regarding your invoice.   Our billing staff will not be able to assist you with questions regarding bills from these companies.  You will be contacted with the lab results as soon as they are available. The fastest way to get your results is to activate your My Chart account. Instructions are located on the last page of this paperwork. If you have not heard from [place] regarding the results in 2 weeks, please contact this office.

## 2016-12-07 NOTE — Telephone Encounter (Signed)
Pt called stating that her prescriptions have been going to the wrong pharmacy pt prescriptions suppose to go to [facility] on [address].Marland Kitchen  Please advise:[phone]

## 2016-12-07 NOTE — Progress Notes (Signed)
Subjective:  By signing my name below, I, [name], attest that this documentation has been prepared under the direction and in the presence of [name], MD Electronically Signed: Ladene Artist, ED Scribe 12/07/2016 at 8:52 AM.   Patient ID: Jessica Stephens, female    DOB: July 14, 1950, 66 y.o.   MRN: [id number]  Chief Complaint  Patient presents with  . Annual Exam   HPI Jessica Stephens is a 66 y.o. female who presents to Primary Care at [facility] for an annual exam. Previous pt of Dr. Everlene Farrier. H/o multiple medical problems. HTN, anxiety, B-12 deficiency, osteoporosis, elevated LFTs, esophageal stricture, hyperlipidemia, ulcerative colitis, GERD. Last CPE in November 2016.   Pt was seen in the office 2 days ago by Dr. Mitchel Honour 2 days ago and started on Cipro 500 mg bid for UTI, Urine culture did indicate E.coli; sensitivities are still pending. Pt states that she had noticed mild burning with sexual intercourse with her husband once that self-resolved. Also noticed that her "bladder flops" with exercising. Pt was referred to GYN for a cystocele in 2015 who recommended follow-up but pt did not return.   CA Screening Colonoscopy: 03/07/15, recommended repeat in 10 years; GI: Dr. Collene Mares. Normal endoscopy 03/2015 Breast CA screening/Mammogram: 05/2013; denies h/o abnormal mammogram.  Pap testing: 04/2013 by Dr. Everlene Farrier; normal Hep C: 2012 HIV screening: 2016  Immunizations Immunization History  Administered Date(s) Administered  . Influenza Split 01/03/2012  . Influenza Whole 01/30/2008  . Influenza,inj,Quad PF,6+ Mos 01/22/2013, 01/27/2014, 02/08/2015, 01/20/2016  . Pneumococcal Conjugate-13 01/27/2014  . Tdap 02/08/2015  Plans to receive her influenza vaccine towards the end of October.   Bone Density  H/o osteoporosis. Pt used to take Fosamax for 2-3 years. She rarely takes Calcium supplements.  Depression Screening Depression screen [facility] 2/9 12/07/2016 01/20/2016 10/20/2015 07/04/2015  02/08/2015  Decreased Interest 0 0 0 0 0  Down, Depressed, Hopeless 0 0 1 0 0  PHQ - 2 Score 0 0 1 0 0    No exam data present  Vision: Pt declined as she is followed by ophthalmology. Reports early onset of cataracts and macular degeneration.  Dentist: Does not currently see dentist or have dental insurance but plans to renew next month. Exercise: occasional  HTN Takes Norvasc 10 mg qd. Losartan 50 mg qd. Pt does not check her BP outside of the office.   Hyperlipidemia  Takes Lipitor 20 mg qd. No new side effects.  At the end of the visit pt asked about itching around her ears.   Pt recently stopped working at [employer] in [employer] pl[employer] but is still working at [employer].  Patient Active Problem List   Diagnosis Date Noted  . Lower urinary tract symptoms (LUTS) 12/05/2016  . Acute UTI 12/05/2016  . Flank pain, acute 12/05/2016  . Prolonged Q-T interval on ECG 08/21/2014  . Lobar pneumonia due to unspecified organism 08/21/2014  . CAP (community acquired pneumonia) 08/19/2014  . Community acquired pneumonia 08/19/2014  . Sepsis ([place]) 08/19/2014  . Hypokalemia 08/19/2014  . HYPERLIPIDEMIA 05/29/2010  . DYSPNEA 05/29/2010  . CHEST PAIN, ATYPICAL 05/02/2010  . ESOPHAGEAL STRICTURE 04/07/2010  . HIATAL HERNIA 04/07/2010  . ULCERATIVE PROCTITIS 04/07/2010  . ARTHRITIS 04/07/2010  . LUQ PAIN 04/07/2010  . TRANSAMINASES, SERUM, ELEVATED 04/07/2010  . VITAMIN B12 DEFICIENCY 01/30/2008  . ANXIETY, CHRONIC 01/29/2008  . Essential hypertension 01/29/2008  . CHRONIC MAXILLARY SINUSITIS 04/24/2007  . G E R D 04/24/2007  . UNSPECIFIED OSTEOPOROSIS 04/24/2007  .  ULCERATIVE COLITIS, LEFT SIDED 02/25/2007   Past Medical History:  Diagnosis Date  . Abnormal transaminases   . Anxiety   . Arthritis   . Esophageal stricture   . GERD (gastroesophageal reflux disease)   . Hiatal hernia   . Hyperlipidemia   . Hypertension   . Maxillary sinusitis   . Proctitis   . Ulcerative colitis ([place])     left sided  . Vitamin B12 deficiency    Past Surgical History:  Procedure Laterality Date  . CESAREAN SECTION    . nose surg    . NOSE SURGERY     Allergies  Allergen Reactions  . Alendronate Sodium   . Sulfonamide Derivatives    Prior to Admission medications   Medication Sig Start Date End Date Taking? Authorizing Provider  amLODipine (NORVASC) 10 MG tablet TAKE ONE TABLET DAILY FOR BLOOD PRESSURE. 01/20/16  Yes Shawnee Knapp, MD  atorvastatin (LIPITOR) 20 MG tablet Take 1 tablet (20 mg total) by mouth daily. 01/20/16  Yes Shawnee Knapp, MD  cetirizine (ZYRTEC ALLERGY) 10 MG tablet Take 1 tablet (10 mg total) by mouth daily. 01/27/14  Yes Robyn Haber, MD  ciprofloxacin (CIPRO) 500 MG tablet Take 1 tablet (500 mg total) by mouth 2 (two) times daily. 12/05/16 12/12/16 Yes Sagardia, Ines Bloomer, MD  esomeprazole (NEXIUM) 20 MG capsule Take 20 mg by mouth daily as needed (indigestion).    Yes [provider]  HYDROcodone-acetaminophen (NORCO/VICODIN) 5-325 MG tablet Take 1-2 tablets by mouth every 4 (four) hours as needed for moderate pain. 01/20/16  Yes Shawnee Knapp, MD  ibuprofen (ADVIL,MOTRIN) 600 MG tablet Take 1 tablet (600 mg total) by mouth 3 (three) times daily. 06/26/15  Yes Larene Pickett, PA-C  losartan (COZAAR) 50 MG tablet Take 1 tablet (50 mg total) by mouth daily. 01/20/16  Yes Shawnee Knapp, MD  meloxicam (MOBIC) 7.5 MG tablet Take 1 tablet (7.5 mg total) by mouth daily. 01/20/16  Yes Shawnee Knapp, MD  methocarbamol (ROBAXIN) 500 MG tablet Take 1 tablet (500 mg total) by mouth 4 (four) times daily. 01/20/16  Yes Shawnee Knapp, MD  Multiple Vitamins-Minerals (VISION VITAMINS) TABS Take 1 tablet by mouth daily.   Yes [provider]   Social History   Social History  . Marital status: Married    Spouse name: N/A  . Number of children: N/A  . Years of education: N/A   Occupational History  . Not on file.   Social History Main Topics  . Smoking status:  Never Smoker  . Smokeless tobacco: Never Used  . Alcohol use No  . Drug use: No  . Sexual activity: Yes    Birth control/ protection: None   Other Topics Concern  . Not on file   Social History Narrative  . No narrative on file   Review of Systems  All other systems reviewed and are negative.     Objective:   Physical Exam  Constitutional: She is oriented to person, place, and time. She appears well-developed and well-nourished.  HENT:  Head: Normocephalic and atraumatic.  Right Ear: External ear normal.  Left Ear: External ear normal.  Mouth/Throat: Oropharynx is clear and moist.  Eyes: Pupils are equal, round, and reactive to light. Conjunctivae are normal.  Neck: Normal range of motion. Neck supple. No thyromegaly present.  Cardiovascular: Normal rate, regular rhythm, normal heart sounds and intact distal pulses.   No murmur heard. Pulmonary/Chest: Effort normal and breath sounds  normal. No respiratory distress. She has no wheezes.  Abdominal: Soft. Bowel sounds are normal. There is no tenderness.  Genitourinary:  Genitourinary Comments: Chaperone/assistant present for exam.  External genitalia appears normal, fullness seen at introitus with some difficulty obtaining Pap. Suspected cystocele.    Musculoskeletal: Normal range of motion. She exhibits no edema or tenderness.  Lymphadenopathy:    She has no cervical adenopathy.  Neurological: She is alert and oriented to person, place, and time.  Skin: Skin is warm and dry. No rash noted.  Psychiatric: She has a normal mood and affect. Her behavior is normal. Thought content normal.  Vitals reviewed.    Vitals:   12/07/16 0829  BP: (!) 146/76  Pulse: 74  Resp: 17  Temp: 97.9 F (36.6 C)  TempSrc: Oral  SpO2: 98%  Weight: 158 lb (71.7 kg)  Height: 5' 0.5" (1.537 m)   Results for orders placed or performed in visit on 12/05/16  Urine Culture  Result Value Ref Range   Urine Culture, Routine Final report (A)      Organism ID, Bacteria Escherichia coli (A)    Antimicrobial Susceptibility Comment   POCT urinalysis dipstick  Result Value Ref Range   Color, UA yellow yellow   Clarity, UA cloudy (A) clear   Glucose, UA negative negative mg/dL   Bilirubin, UA negative negative   Ketones, POC UA negative negative mg/dL   Spec Grav, UA <=1.005 (A) 1.010 - 1.025   Blood, UA moderate (A) negative   pH, UA 6.5 5.0 - 8.0   Protein Ur, POC negative negative mg/dL   Urobilinogen, UA 0.2 0.2 or 1.0 E.U./dL   Nitrite, UA Negative Negative   Leukocytes, UA Large (3+) (A) Negative       Assessment & Plan:    BALERIA WYMAN is a 66 y.o. female Annual physical exam  - -anticipatory guidance as below in AVS, screening labs above. Health maintenance items as above in HPI discussed/recommended as applicable.   -Referred for mammogram, bone density testing.  Essential hypertension - Plan: DISCONTINUED: amLODipine (NORVASC) 10 MG tablet, DISCONTINUED: losartan (COZAAR) 50 MG tablet  - Borderline elevations in office. Continue Norvasc, losartan same dose for now, monitor readings at home, and if remaining over 140/90, call for change in regimen.   Hyperlipidemia, unspecified hyperlipidemia type - Plan: DISCONTINUED: atorvastatin (LIPITOR) 20 MG tablet  -Tolerating Lipitor.  It appears she did not have labs checked during office visit. Will order labs for lab only visit for fasting check.  Screening for cervical cancer - Plan: Pap IG w/ reflex to HPV when ASC-U  -Pap obtained, but technically difficult due to suspected cystocele. Advised if we did not get sufficient cells for that testing, he may need to be repeated with gynecology.  -With history of cystocele, refer to gynecology to discuss further treatment as apparently follow-up had been recommended. Screening for breast cancer - Plan: MM Digital Screening  Osteoporosis, unspecified osteoporosis type, unspecified pathological fracture presence - Plan: DG  Bone Density  Advised to avoid Q-tips in ears, and if itching persists, return to discuss further.  Urinary tract infection improving, follow up if worsening symptoms.   Meds ordered this encounter  Medications  . DISCONTD: amLODipine (NORVASC) 10 MG tablet    Sig: TAKE ONE TABLET DAILY FOR BLOOD PRESSURE.    Dispense:  90 tablet    Refill:  3  . DISCONTD: atorvastatin (LIPITOR) 20 MG tablet    Sig: Take 1 tablet (20 mg total)  by mouth daily.    Dispense:  90 tablet    Refill:  3  . DISCONTD: losartan (COZAAR) 50 MG tablet    Sig: Take 1 tablet (50 mg total) by mouth daily.    Dispense:  90 tablet    Refill:  3   Patient Instructions   Urine infection should continue to improve - return if those symptoms do not improve with current antibiotic, or if return. I would recommend following up with gynecology to discuss cystocoele and plan. Please call to schedule appointment with OBGYN. If they need a referral, let me know. Depending on the results of your Pap test today, that may need to be repeated with OB/GYN.  I will refer you for bone density.  Make sure you are obtaining at least 1200-15102m calcium per day, and vitamin D [phone] units per day.   I will refer you for a mammogram.   Keep a record of your blood pressures outside of the office and if those readings are remaining over 140/90 - return to discuss changes.   I will check cholesterol.   Avoid Q-tips and other objects to your ears. If the outside of the ear itches, you can apply Aveeno lotion. If that continues, please return so we can discuss other causes or treatments.  Thank you for coming in today. If there are other questions or concerns we did not have a chance to cover, please schedule another appointment and I'll be happy to review those.   Hypertension Hypertension, commonly called high blood pressure, is when the force of blood pumping through the arteries is too strong. The arteries are the blood vessels  that carry blood from the heart throughout the body. Hypertension forces the heart to work harder to pump blood and may cause arteries to become narrow or stiff. Having untreated or uncontrolled hypertension can cause heart attacks, strokes, kidney disease, and other problems. A blood pressure reading consists of a higher number over a lower number. Ideally, your blood pressure should be below 120/80. The first ("top") number is called the systolic pressure. It is a measure of the pressure in your arteries as your heart beats. The second ("bottom") number is called the diastolic pressure. It is a measure of the pressure in your arteries as the heart relaxes. What are the causes? The cause of this condition is not known. What increases the risk? Some risk factors for high blood pressure are under your control. Others are not. Factors you can change  Smoking.  Having type 2 diabetes mellitus, high cholesterol, or both.  Not getting enough exercise or physical activity.  Being overweight.  Having too much fat, sugar, calories, or salt (sodium) in your diet.  Drinking too much alcohol. Factors that are difficult or impossible to change  Having chronic kidney disease.  Having a family history of high blood pressure.  Age. Risk increases with age.  Race. You may be at higher risk if you are African-American.  Gender. Men are at higher risk than women before age 37577 After age 64 women are at higher risk than men.  Having obstructive sleep apnea.  Stress. What are the signs or symptoms? Extremely high blood pressure (hypertensive crisis) may cause:  Headache.  Anxiety.  Shortness of breath.  Nosebleed.  Nausea and vomiting.  Severe chest pain.  Jerky movements you cannot control (seizures).  How is this diagnosed? This condition is diagnosed by measuring your blood pressure while you are seated, with your arm resting  on a surface. The cuff of the blood pressure monitor  will be placed directly against the skin of your upper arm at the level of your heart. It should be measured at least twice using the same arm. Certain conditions can cause a difference in blood pressure between your right and left arms. Certain factors can cause blood pressure readings to be lower or higher than normal (elevated) for a short period of time:  When your blood pressure is higher when you are in a health care provider's office than when you are at home, this is called white coat hypertension. Most people with this condition do not need medicines.  When your blood pressure is higher at home than when you are in a health care provider's office, this is called masked hypertension. Most people with this condition may need medicines to control blood pressure.  If you have a high blood pressure reading during one visit or you have normal blood pressure with other risk factors:  You may be asked to return on a different day to have your blood pressure checked again.  You may be asked to monitor your blood pressure at home for 1 week or longer.  If you are diagnosed with hypertension, you may have other blood or imaging tests to help your health care provider understand your overall risk for other conditions. How is this treated? This condition is treated by making healthy lifestyle changes, such as eating healthy foods, exercising more, and reducing your alcohol intake. Your health care provider may prescribe medicine if lifestyle changes are not enough to get your blood pressure under control, and if:  Your systolic blood pressure is above 130.  Your diastolic blood pressure is above 80.  Your personal target blood pressure may vary depending on your medical conditions, your age, and other factors. Follow these instructions at home: Eating and drinking  Eat a diet that is high in fiber and potassium, and low in sodium, added sugar, and fat. An example eating plan is called the DASH  (Dietary Approaches to Stop Hypertension) diet. To eat this way: ? Eat plenty of fresh fruits and vegetables. Try to fill half of your plate at each meal with fruits and vegetables. ? Eat whole grains, such as whole wheat pasta, brown rice, or whole grain bread. Fill about one quarter of your plate with whole grains. ? Eat or drink low-fat dairy products, such as skim milk or low-fat yogurt. ? Avoid fatty cuts of meat, processed or cured meats, and poultry with skin. Fill about one quarter of your plate with lean proteins, such as fish, chicken without skin, beans, eggs, and tofu. ? Avoid premade and processed foods. These tend to be higher in sodium, added sugar, and fat.  Reduce your daily sodium intake. Most people with hypertension should eat less than 1,500 mg of sodium a day.  Limit alcohol intake to no more than 1 drink a day for nonpregnant women and 2 drinks a day for men. One drink equals 12 oz of beer, 5 oz of wine, or 1 oz of hard liquor. Lifestyle  Work with your health care provider to maintain a healthy body weight or to lose weight. Ask what an ideal weight is for you.  Get at least 30 minutes of exercise that causes your heart to beat faster (aerobic exercise) most days of the week. Activities may include walking, swimming, or biking.  Include exercise to strengthen your muscles (resistance exercise), such as pilates or lifting  weights, as part of your weekly exercise routine. Try to do these types of exercises for 30 minutes at least 3 days a week.  Do not use any products that contain nicotine or tobacco, such as cigarettes and e-cigarettes. If you need help quitting, ask your health care provider.  Monitor your blood pressure at home as told by your health care provider.  Keep all follow-up visits as told by your health care provider. This is important. Medicines  Take over-the-counter and prescription medicines only as told by your health care provider. Follow  directions carefully. Blood pressure medicines must be taken as prescribed.  Do not skip doses of blood pressure medicine. Doing this puts you at risk for problems and can make the medicine less effective.  Ask your health care provider about side effects or reactions to medicines that you should watch for. Contact a health care provider if:  You think you are having a reaction to a medicine you are taking.  You have headaches that keep coming back (recurring).  You feel dizzy.  You have swelling in your ankles.  You have trouble with your vision. Get help right away if:  You develop a severe headache or confusion.  You have unusual weakness or numbness.  You feel faint.  You have severe pain in your chest or abdomen.  You vomit repeatedly.  You have trouble breathing. Summary  Hypertension is when the force of blood pumping through your arteries is too strong. If this condition is not controlled, it may put you at risk for serious complications.  Your personal target blood pressure may vary depending on your medical conditions, your age, and other factors. For most people, a normal blood pressure is less than 120/80.  Hypertension is treated with lifestyle changes, medicines, or a combination of both. Lifestyle changes include weight loss, eating a healthy, low-sodium diet, exercising more, and limiting alcohol. This information is not intended to replace advice given to you by your health care provider. Make sure you discuss any questions you have with your health care provider. Document Released: 03/19/2005 Document Revised: 02/15/2016 Document Reviewed: 02/15/2016 Elsevier Interactive Patient Education  2018 [employer].   Osteoporosis Osteoporosis is the thinning and loss of density in the bones. Osteoporosis makes the bones more brittle, fragile, and likely to break (fracture). Over time, osteoporosis can cause the bones to become so weak that they fracture after a  simple fall. The bones most likely to fracture are the bones in the hip, wrist, and spine. What are the causes? The exact cause is not known. What increases the risk? Anyone can develop osteoporosis. You may be at greater risk if you have a family history of the condition or have poor nutrition. You may also have a higher risk if you are:  Female.  44 years old or older.  A smoker.  Not physically active.  White or Asian.  Slender.  What are the signs or symptoms? A fracture might be the first sign of the disease, especially if it results from a fall or injury that would not usually cause a bone to break. Other signs and symptoms include:  Low back and neck pain.  Stooped posture.  Height loss.  How is this diagnosed? To make a diagnosis, your health care provider may:  Take a medical history.  Perform a physical exam.  Order tests, such as: ? A bone mineral density test. ? A dual-energy X-ray absorptiometry test.  How is this treated? The goal  of osteoporosis treatment is to strengthen your bones to reduce your risk of a fracture. Treatment may involve:  Making lifestyle changes, such as: ? Eating a diet rich in calcium. ? Doing weight-bearing and muscle-strengthening exercises. ? Stopping tobacco use. ? Limiting alcohol intake.  Taking medicine to slow the process of bone loss or to increase bone density.  Monitoring your levels of calcium and vitamin D.  Follow these instructions at home:  Include calcium and vitamin D in your diet. Calcium is important for bone health, and vitamin D helps the body absorb calcium.  Perform weight-bearing and muscle-strengthening exercises as directed by your health care provider.  Do not use any tobacco products, including cigarettes, chewing tobacco, and electronic cigarettes. If you need help quitting, ask your health care provider.  Limit your alcohol intake.  Take medicines only as directed by your health care  provider.  Keep all follow-up visits as directed by your health care provider. This is important.  Take precautions at home to lower your risk of falling, such as: ? Keeping rooms well lit and clutter free. ? Installing safety rails on stairs. ? Using rubber mats in the bathroom and other areas that are often wet or slippery. Get help right away if: You fall or injure yourself. This information is not intended to replace advice given to you by your health care provider. Make sure you discuss any questions you have with your health care provider. Document Released: 12/27/2004 Document Revised: 08/22/2015 Document Reviewed: 08/27/2013 Elsevier Interactive Patient Education  2017 [employer]  Get These Tests  Blood Pressure- Have your blood pressure checked by your healthcare provider at least once a year.  Normal blood pressure is 120/80.  Weight- Have your body mass index (BMI) calculated to screen for obesity.  BMI is a measure of body fat based on height and weight.  You can calculate your own BMI at [employer]  Cholesterol- Have your cholesterol checked every year.  Diabetes- Have your blood sugar checked every year if you have high blood pressure, high cholesterol, a family history of diabetes or if you are overweight.  Pap Test - Have a pap test every 1 to 5 years if you have been sexually active.  If you are older than 65 and recent pap tests have been normal you may not need additional pap tests.  In addition, if you have had a hysterectomy  for benign disease additional pap tests are not necessary.  Mammogram-Yearly mammograms are essential for early detection of breast cancer  Screening for Colon Cancer- Colonoscopy starting at age 70. Screening may begin sooner depending on your family history and other health conditions.  Follow up colonoscopy as directed by your Gastroenterologist.  Screening for Osteoporosis- Screening begins at age  16 with bone density scanning, sooner if you are at higher risk for developing Osteoporosis.  Get these medicines  Calcium with Vitamin D- Your body requires 1200-1500 mg of Calcium a day and [phone] IU of Vitamin D a day.  You can only absorb 500 mg of Calcium at a time therefore Calcium must be taken in 2 or 3 separate doses throughout the day.  Hormones- Hormone therapy has been associated with increased risk for certain cancers and heart disease.  Talk to your healthcare provider about if you need relief from menopausal symptoms.  Aspirin- Ask your healthcare provider about taking Aspirin to prevent Heart Disease and Stroke.  Get these Immuniztions  Flu shot- Every fall  Pneumonia shot- Once after the age of 33; if you are younger ask your healthcare provider if you need a pneumonia shot.  Tetanus- Every ten years.  Zostavax- Once after the age of 51 to prevent shingles.  Take these steps  Don't smoke- Your healthcare provider can help you quit. For tips on how to quit, ask your healthcare provider or go to www.smokefree.gov or call 1-800 QUIT-NOW.  Be physically active- Exercise 5 days a week for a minimum of 30 minutes.  If you are not already physically active, start slow and gradually work up to 30 minutes of moderate physical activity.  Try walking, dancing, bike riding, swimming, etc.  Eat a healthy diet- Eat a variety of healthy foods such as fruits, vegetables, whole grains, low fat milk, low fat cheeses, yogurt, lean meats, chicken, fish, eggs, dried beans, tofu, etc.  For more information go to www.thenutritionsource.org  Dental visit- Brush and floss teeth twice daily; visit your dentist twice a year.  Eye exam- Visit your Optometrist or Ophthalmologist yearly.  Drink alcohol in moderation- Limit alcohol intake to one drink or less a day.  Never drink and drive.  Depression- Your emotional health is as important as your physical health.  If you're feeling down or  losing interest in things you normally enjoy, please talk to your healthcare provider.  Seat Belts- can save your life; always wear one  Smoke/Carbon Monoxide detectors- These detectors need to be installed on the appropriate level of your home.  Replace batteries at least once a year.  Violence- If anyone is threatening or hurting you, please tell your healthcare provider.  Living Will/ Health care power of attorney- Discuss with your healthcare provider and family.     IF you received an x-ray today, you will receive an invoice from [facility]. Please contact [facility] at [phone] with questions or concerns regarding your invoice.   IF you received labwork today, you will receive an invoice from [facility]. Please contact [facility] at [phone] with questions or concerns regarding your invoice.   Our billing staff will not be able to assist you with questions regarding bills from these companies.  You will be contacted with the lab results as soon as they are available. The fastest way to get your results is to activate your My Chart account. Instructions are located on the last page of this paperwork. If you have not heard from [place] regarding the results in 2 weeks, please contact this office.       I personally performed the services described in this documentation, which was scribed in my presence. The recorded information has been reviewed and considered for accuracy and completeness, addended by me as needed, and agree with information above.  Signed,   Merri Ray, MD [facility].  12/09/16 2:19 PM

## 2016-12-07 NOTE — Telephone Encounter (Signed)
[facility] [address]. Will call and [facility] the [facility] at [facility] [address] if patient desires.

## 2016-12-07 NOTE — Telephone Encounter (Signed)
[facility] [address]. Other pharmacy deleted out of chart.

## 2016-12-09 NOTE — Telephone Encounter (Signed)
Please call patient. I I don't think we checked her blood work at last visit. I placed a lab only order for CMP and lipid panel when she can come by at her convenience for lab only visit. Please apologize for the extra trip.  I will also refer her to Dr. Dellis Filbert for cystocoele as discussed.   Thanks. -JG

## 2016-12-10 LAB — PAP IG W/ RFLX HPV ASCU: PAP Smear Comment: 0

## 2016-12-10 NOTE — Telephone Encounter (Signed)
There are future order for labs, ask patient to come in for lab only visit for labs.   Her prescriptions were filled.

## 2016-12-13 ENCOUNTER — Encounter: Payer: 59 | Admitting: Family Medicine

## 2016-12-17 ENCOUNTER — Telehealth: Payer: Self-pay | Admitting: Family Medicine

## 2016-12-17 NOTE — Telephone Encounter (Signed)
Pt called wanting someone to go over the results of her labs with her.  She says she doesn't understand the letter. [phone]

## 2016-12-18 NOTE — Telephone Encounter (Signed)
LVM to have pt call back regarding results.

## 2016-12-24 ENCOUNTER — Ambulatory Visit (INDEPENDENT_AMBULATORY_CARE_PROVIDER_SITE_OTHER): Payer: 59 | Admitting: Family Medicine

## 2016-12-24 ENCOUNTER — Telehealth: Payer: Self-pay

## 2016-12-24 ENCOUNTER — Ambulatory Visit
Admission: RE | Admit: 2016-12-24 | Discharge: 2016-12-24 | Disposition: A | Discharge status: Home / Self Care | Payer: 59 | Source: Ambulatory Visit | Attending: Family Medicine | Admitting: Family Medicine

## 2016-12-24 DIAGNOSIS — E785 Hyperlipidemia, unspecified: Secondary | ICD-10-CM

## 2016-12-24 DIAGNOSIS — Z1239 Encounter for other screening for malignant neoplasm of breast: Secondary | ICD-10-CM

## 2016-12-24 NOTE — Progress Notes (Signed)
Lab visit only

## 2016-12-24 NOTE — Telephone Encounter (Signed)
Pt came into the office concerned about how her bone density scanned was ordered. She stated that when she called her insurance it was labeled as a diagnostic test and that if it were labeled as a preventative exam. She would not like to do it if it is not fully covered by insurance. Please advise.

## 2016-12-24 NOTE — Telephone Encounter (Signed)
Please clarify question. I did order it as a screening test, but she did have osteoporosis as a diagnosis. Does it need to be ordered differently, or a different test need to be ordered for her insurance to cover it differently?  Can clarify this with her insurance or billing if needed

## 2016-12-25 ENCOUNTER — Other Ambulatory Visit: Payer: Self-pay | Admitting: Family Medicine

## 2016-12-25 DIAGNOSIS — E2839 Other primary ovarian failure: Secondary | ICD-10-CM

## 2016-12-25 LAB — COMPREHENSIVE METABOLIC PANEL
ALT: 22 IU/L (ref 0–32)
AST: 23 IU/L (ref 0–40)
Albumin/Globulin Ratio: 2 (ref 1.2–2.2)
Albumin: 4.5 g/dL (ref 3.6–4.8)
Alkaline Phosphatase: 115 IU/L (ref 39–117)
BUN/Creatinine Ratio: 18 (ref 12–28)
BUN: 14 mg/dL (ref 8–27)
Bilirubin Total: 0.6 mg/dL (ref 0.0–1.2)
CALCIUM: 9.1 mg/dL (ref 8.7–10.3)
CO2: 23 mmol/L (ref 20–29)
CREATININE: 0.79 mg/dL (ref 0.57–1.00)
Chloride: 102 mmol/L (ref 96–106)
GFR calc non Af Amer: 79 mL/min/{1.73_m2} (ref >59)
GFR, EST AFRICAN AMERICAN: 91 mL/min/{1.73_m2} (ref >59)
GLOBULIN, TOTAL: 2.3 g/dL (ref 1.5–4.5)
Glucose: 82 mg/dL (ref 65–99)
Potassium: 4.4 mmol/L (ref 3.5–5.2)
SODIUM: 140 mmol/L (ref 134–144)
TOTAL PROTEIN: 6.8 g/dL (ref 6.0–8.5)

## 2016-12-25 LAB — LIPID PANEL
CHOL/HDL RATIO: 2.4 ratio (ref 0.0–4.4)
Cholesterol, Total: 141 mg/dL (ref 100–199)
HDL: 58 mg/dL (ref >39)
LDL CALC: 70 mg/dL (ref 0–99)
TRIGLYCERIDES: 66 mg/dL (ref 0–149)
VLDL Cholesterol Cal: 13 mg/dL (ref 5–40)

## 2016-12-25 NOTE — Telephone Encounter (Signed)
Patient said she called her insurance company and if it is put in as diagnostic it is only covered 80%  But if it is put in preventative it is covered 100%.    They did not have an ICD code for the preventative.       Patient would like a call when it is completed and states it is ok to leave a detailed message

## 2016-12-25 NOTE — Telephone Encounter (Signed)
Lm need to clarify what she was told about order.

## 2017-01-30 ENCOUNTER — Other Ambulatory Visit: Payer: Self-pay | Admitting: Family Medicine

## 2017-01-30 DIAGNOSIS — I10 Essential (primary) hypertension: Secondary | ICD-10-CM

## 2017-02-08 ENCOUNTER — Other Ambulatory Visit: Payer: Self-pay

## 2017-02-08 ENCOUNTER — Encounter: Payer: Self-pay | Admitting: Emergency Medicine

## 2017-02-08 ENCOUNTER — Ambulatory Visit (INDEPENDENT_AMBULATORY_CARE_PROVIDER_SITE_OTHER): Payer: 59 | Admitting: Emergency Medicine

## 2017-02-08 VITALS — BP 140/64 | HR 71 | Temp 98.2°F | Resp 16 | Ht 60.25 in | Wt 157.4 lb

## 2017-02-08 DIAGNOSIS — L03012 Cellulitis of left finger: Secondary | ICD-10-CM | POA: Diagnosis not present

## 2017-02-08 DIAGNOSIS — T148XXA Other injury of unspecified body region, initial encounter: Secondary | ICD-10-CM

## 2017-02-08 MED ORDER — MUPIROCIN CALCIUM 2 % EX CREA: 1.0000 "application " | TOPICAL_CREAM | Freq: Two times a day (BID) | CUTANEOUS | 0 refills | Status: DC

## 2017-02-08 MED ORDER — CEPHALEXIN 500 MG PO CAPS: 500.0000 mg | ORAL_CAPSULE | Freq: Three times a day (TID) | ORAL | 0 refills | Status: AC

## 2017-02-08 NOTE — Progress Notes (Signed)
Jessica Stephens 66 y.o.   Chief Complaint  Patient presents with  . Finger Injury    LEFT THUMB     HISTORY OF PRESENT ILLNESS: This is a 66 y.o. female complaining of infected puncture wound to left thumb sustained 1 week ago; c/o pain and inflammation.  HPI   Prior to Admission medications   Medication Sig Start Date End Date Taking? Authorizing Provider  amLODipine (NORVASC) 10 MG tablet TAKE ONE TABLET DAILY FOR BLOOD PRESSURE. 12/07/16  Yes [name], MD  atorvastatin (LIPITOR) 20 MG tablet Take 1 tablet (20 mg total) by mouth daily. 12/07/16  Yes [name], MD  cetirizine (ZYRTEC ALLERGY) 10 MG tablet Take 1 tablet (10 mg total) by mouth daily. 01/27/14  Yes Robyn Haber, MD  esomeprazole (NEXIUM) 20 MG capsule Take 20 mg by mouth daily as needed (indigestion).    Yes [provider]  losartan (COZAAR) 50 MG tablet Take 1 tablet (50 mg total) by mouth daily. 12/07/16  Yes [name], MD  Multiple Vitamins-Minerals (VISION VITAMINS) TABS Take 1 tablet by mouth daily.   Yes [provider]  HYDROcodone-acetaminophen (NORCO/VICODIN) 5-325 MG tablet Take 1-2 tablets by mouth every 4 (four) hours as needed for moderate pain. Patient not taking: Reported on 02/08/2017 01/20/16   Shawnee Knapp, MD  ibuprofen (ADVIL,MOTRIN) 600 MG tablet Take 1 tablet (600 mg total) by mouth 3 (three) times daily. Patient not taking: Reported on 02/08/2017 06/26/15   Larene Pickett, PA-C  meloxicam (MOBIC) 7.5 MG tablet Take 1 tablet (7.5 mg total) by mouth daily. Patient not taking: Reported on 02/08/2017 01/20/16   Shawnee Knapp, MD  methocarbamol (ROBAXIN) 500 MG tablet Take 1 tablet (500 mg total) by mouth 4 (four) times daily. Patient not taking: Reported on 02/08/2017 01/20/16   Shawnee Knapp, MD    Allergies  Allergen Reactions  . Alendronate Sodium   . Sulfonamide Derivatives     Patient Active Problem List   Diagnosis Date Noted  . Lower urinary tract  symptoms (LUTS) 12/05/2016  . Acute UTI 12/05/2016  . Flank pain, acute 12/05/2016  . Prolonged Q-T interval on ECG 08/21/2014  . Lobar pneumonia due to unspecified organism 08/21/2014  . CAP (community acquired pneumonia) 08/19/2014  . Community acquired pneumonia 08/19/2014  . Sepsis ([place]) 08/19/2014  . Hypokalemia 08/19/2014  . HYPERLIPIDEMIA 05/29/2010  . DYSPNEA 05/29/2010  . CHEST PAIN, ATYPICAL 05/02/2010  . ESOPHAGEAL STRICTURE 04/07/2010  . HIATAL HERNIA 04/07/2010  . ULCERATIVE PROCTITIS 04/07/2010  . ARTHRITIS 04/07/2010  . LUQ PAIN 04/07/2010  . TRANSAMINASES, SERUM, ELEVATED 04/07/2010  . VITAMIN B12 DEFICIENCY 01/30/2008  . ANXIETY, CHRONIC 01/29/2008  . Essential hypertension 01/29/2008  . CHRONIC MAXILLARY SINUSITIS 04/24/2007  . G E R D 04/24/2007  . UNSPECIFIED OSTEOPOROSIS 04/24/2007  . ULCERATIVE COLITIS, LEFT SIDED 02/25/2007    Past Medical History:  Diagnosis Date  . Abnormal transaminases   . Anxiety   . Arthritis   . Esophageal stricture   . GERD (gastroesophageal reflux disease)   . Hiatal hernia   . Hyperlipidemia   . Hypertension   . Maxillary sinusitis   . Proctitis   . Ulcerative colitis ([place])    left sided  . Vitamin B12 deficiency     Past Surgical History:  Procedure Laterality Date  . CESAREAN SECTION    . nose surg    . NOSE SURGERY      Social History  Socioeconomic History  . Marital status: Married    Spouse name: Not on file  . Number of children: Not on file  . Years of education: Not on file  . Highest education level: Not on file  Social Needs  . Financial resource strain: Not on file  . Food insecurity - worry: Not on file  . Food insecurity - inability: Not on file  . Transportation needs - medical: Not on file  . Transportation needs - non-medical: Not on file  Occupational History  . Not on file  Tobacco Use  . Smoking status: Never Smoker  . Smokeless tobacco: Never Used  Substance and Sexual  Activity  . Alcohol use: No  . Drug use: No  . Sexual activity: Yes    Birth control/protection: None  Other Topics Concern  . Not on file  Social History Narrative  . Not on file    Family History  Problem Relation Age of Onset  . Arthritis Mother   . Dementia Mother   . Diabetes Father   . Hyperlipidemia Father   . Hypertension Father      Review of Systems  Constitutional: Negative.  Negative for chills and fever.  Respiratory: Negative for cough and shortness of breath.   Cardiovascular: Negative for chest pain.  Gastrointestinal: Negative for diarrhea, nausea and vomiting.   Vitals:   02/08/17 1737  BP: 140/64  Pulse: 71  Resp: 16  Temp: 98.2 F (36.8 C)  SpO2: 97%     Physical Exam  Constitutional: She is oriented to person, place, and time. She appears well-developed and well-nourished.  HENT:  Head: Normocephalic.  Neck: Normal range of motion.  Cardiovascular: Normal rate.  Pulmonary/Chest: Effort normal.  Musculoskeletal:  Left thumb: NVI; +distal erythema and swelling to cuticular area with small piece of granulating tissue  Neurological: She is alert and oriented to person, place, and time. No sensory deficit. She exhibits normal muscle tone.  Skin: Skin is warm and dry. Capillary refill takes less than 2 seconds.  Psychiatric: She has a normal mood and affect. Her behavior is normal.  Vitals reviewed.    ASSESSMENT & PLAN: Megyn was seen today for finger injury.  Diagnoses and all orders for this visit:  Cellulitis of left thumb  Puncture wound  Other orders -     cephALEXin (KEFLEX) 500 MG capsule; Take 1 capsule (500 mg total) 3 (three) times daily for 7 days by mouth. -     mupirocin cream (BACTROBAN) 2 %; Apply 1 application 2 (two) times daily topically.    Patient Instructions       IF you received an x-ray today, you will receive an invoice from [facility]. Please contact [facility] at [phone]  with questions or concerns regarding your invoice.   IF you received labwork today, you will receive an invoice from [facility]. Please contact [facility] at [phone] with questions or concerns regarding your invoice.   Our billing staff will not be able to assist you with questions regarding bills from these companies.  You will be contacted with the lab results as soon as they are available. The fastest way to get your results is to activate your My Chart account. Instructions are located on the last page of this paperwork. If you have not heard from [place] regarding the results in 2 weeks, please contact this office.     Paronychia Paronychia is an infection of the skin. It happens near a fingernail or toenail. It may  cause pain and swelling around the nail. Usually, it is not serious and it clears up with treatment. Follow these instructions at home:  Soak the fingers or toes in warm water as told by your doctor. You may be told to do this for 20 minutes, 2-3 times a day.  Keep the area dry when you are not soaking it.  Take medicines only as told by your doctor.  If you were given an antibiotic medicine, finish all of it even if you start to feel better.  Keep the affected area clean.  Do not try to drain a fluid-filled bump yourself.  Wear rubber gloves when putting your hands in water.  Wear gloves if your hands might touch cleaners or chemicals.  Follow your doctor's instructions about: ? Wound care. ? Bandage (dressing) changes and removal. Contact a doctor if:  Your symptoms get worse or do not improve.  You have a fever or chills.  You have redness spreading from the affected area.  You have more fluid, blood, or pus coming from the affected area.  Your finger or knuckle is swollen or is hard to move. This information is not intended to replace advice given to you by your health care provider. Make sure you discuss any questions you have with your health care  provider. Document Released: 03/07/2009 Document Revised: 08/25/2015 Document Reviewed: 02/24/2014 Elsevier Interactive Patient Education  2018 Elsevier Inc.      Agustina Caroli, MD [facility]

## 2017-02-08 NOTE — Patient Instructions (Addendum)
     IF you received an x-ray today, you will receive an invoice from [facility]. Please contact [facility] at [phone] with questions or concerns regarding your invoice.   IF you received labwork today, you will receive an invoice from [facility]. Please contact [facility] at [phone] with questions or concerns regarding your invoice.   Our billing staff will not be able to assist you with questions regarding bills from these companies.  You will be contacted with the lab results as soon as they are available. The fastest way to get your results is to activate your My Chart account. Instructions are located on the last page of this paperwork. If you have not heard from [place] regarding the results in 2 weeks, please contact this office.     Paronychia Paronychia is an infection of the skin. It happens near a fingernail or toenail. It may cause pain and swelling around the nail. Usually, it is not serious and it clears up with treatment. Follow these instructions at home:  Soak the fingers or toes in warm water as told by your doctor. You may be told to do this for 20 minutes, 2-3 times a day.  Keep the area dry when you are not soaking it.  Take medicines only as told by your doctor.  If you were given an antibiotic medicine, finish all of it even if you start to feel better.  Keep the affected area clean.  Do not try to drain a fluid-filled bump yourself.  Wear rubber gloves when putting your hands in water.  Wear gloves if your hands might touch cleaners or chemicals.  Follow your doctor's instructions about: ? Wound care. ? Bandage (dressing) changes and removal. Contact a doctor if:  Your symptoms get worse or do not improve.  You have a fever or chills.  You have redness spreading from the affected area.  You have more fluid, blood, or pus coming from the affected area.  Your finger or knuckle is swollen or is hard to move. This information is  not intended to replace advice given to you by your health care provider. Make sure you discuss any questions you have with your health care provider. Document Released: 03/07/2009 Document Revised: 08/25/2015 Document Reviewed: 02/24/2014 Elsevier Interactive Patient Education  [employer].

## 2017-08-27 ENCOUNTER — Encounter: Payer: Self-pay | Admitting: Family Medicine

## 2017-11-05 DIAGNOSIS — K5100 Ulcerative (chronic) pancolitis without complications: Secondary | ICD-10-CM | POA: Diagnosis not present

## 2017-11-05 DIAGNOSIS — E669 Obesity, unspecified: Secondary | ICD-10-CM | POA: Diagnosis not present

## 2017-11-05 DIAGNOSIS — Z1211 Encounter for screening for malignant neoplasm of colon: Secondary | ICD-10-CM | POA: Diagnosis not present

## 2018-01-14 ENCOUNTER — Encounter: Payer: Self-pay | Admitting: Family Medicine

## 2018-01-14 ENCOUNTER — Ambulatory Visit (INDEPENDENT_AMBULATORY_CARE_PROVIDER_SITE_OTHER): Payer: PPO | Admitting: Family Medicine

## 2018-01-14 VITALS — BP 126/68 | HR 90 | Temp 98.4°F | Resp 16 | Ht 60.0 in | Wt 154.0 lb

## 2018-01-14 DIAGNOSIS — S00412A Abrasion of left ear, initial encounter: Secondary | ICD-10-CM | POA: Diagnosis not present

## 2018-01-14 DIAGNOSIS — J329 Chronic sinusitis, unspecified: Secondary | ICD-10-CM

## 2018-01-14 DIAGNOSIS — H9202 Otalgia, left ear: Secondary | ICD-10-CM | POA: Diagnosis not present

## 2018-01-14 DIAGNOSIS — R0981 Nasal congestion: Secondary | ICD-10-CM

## 2018-01-14 MED ORDER — AMOXICILLIN-POT CLAVULANATE 875-125 MG PO TABS: 1.0000 | ORAL_TABLET | Freq: Two times a day (BID) | ORAL | 0 refills | Status: DC

## 2018-01-14 MED ORDER — NEOMYCIN-POLYMYXIN-HC 3.5-10000-1 OT SUSP: 3.0000 [drp] | Freq: Three times a day (TID) | OTIC | 0 refills | Status: DC

## 2018-01-14 MED ORDER — FLUTICASONE PROPIONATE 50 MCG/ACT NA SUSP: 2.0000 | Freq: Every day | NASAL | 6 refills | Status: DC

## 2018-01-14 NOTE — Progress Notes (Signed)
Subjective:    Patient ID: Jessica Stephens, female    DOB: Oct 08, 1950, 67 y.o.   MRN: [id number]  HPI Jessica Stephens is a 67 y.o. female Presents today for: Chief Complaint  Patient presents with  . Sinusitis  . discuss medication   Sinus issues: Has had some consistent congestion for past month - 6 weeks.  No discolored congestion - possibly yellow initially. Drainage down throat at times.  Has used saline NS few times per day. No steroid nasal sprays. Has used vicks nasal spray a few times at the most - but not daily. Maybe once per week.   Has used cetirizine.  Left ear feeling blocked for about a month. No ear discharge. Ears itching. Sneezing at times. Allergy pill helps.  No fever. Some pressure in sinuses.  Left ear sore. Uses qtips in ears. Popping/clicking on left side.   Hypertension: BP Readings from Last 3 Encounters:  01/14/18 126/68  02/08/17 140/64  12/07/16 (!) 146/76   Lab Results  Component Value Date   CREATININE 0.79 12/24/2016   Has concerns with losartan and recalls. Has discussed with [facility]. Her batch was apparently not recalled. She is unsure what she would like to do. She would like to stay on same regimen for now.    Patient Active Problem List   Diagnosis Date Noted  . Lower urinary tract symptoms (LUTS) 12/05/2016  . Acute UTI 12/05/2016  . Flank pain, acute 12/05/2016  . Prolonged Q-T interval on ECG 08/21/2014  . Lobar pneumonia due to unspecified organism 08/21/2014  . CAP (community acquired pneumonia) 08/19/2014  . Community acquired pneumonia 08/19/2014  . Sepsis ([place]) 08/19/2014  . Hypokalemia 08/19/2014  . HYPERLIPIDEMIA 05/29/2010  . DYSPNEA 05/29/2010  . CHEST PAIN, ATYPICAL 05/02/2010  . ESOPHAGEAL STRICTURE 04/07/2010  . HIATAL HERNIA 04/07/2010  . ULCERATIVE PROCTITIS 04/07/2010  . ARTHRITIS 04/07/2010  . LUQ PAIN 04/07/2010  . TRANSAMINASES, SERUM, ELEVATED 04/07/2010  . VITAMIN B12 DEFICIENCY 01/30/2008  . ANXIETY,  CHRONIC 01/29/2008  . Essential hypertension 01/29/2008  . CHRONIC MAXILLARY SINUSITIS 04/24/2007  . G E R D 04/24/2007  . UNSPECIFIED OSTEOPOROSIS 04/24/2007  . ULCERATIVE COLITIS, LEFT SIDED 02/25/2007   Past Medical History:  Diagnosis Date  . Abnormal transaminases   . Anxiety   . Arthritis   . Esophageal stricture   . GERD (gastroesophageal reflux disease)   . Hiatal hernia   . Hyperlipidemia   . Hypertension   . Maxillary sinusitis   . Proctitis   . Ulcerative colitis ([place])    left sided  . Vitamin B12 deficiency    Past Surgical History:  Procedure Laterality Date  . CESAREAN SECTION    . nose surg    . NOSE SURGERY     Allergies  Allergen Reactions  . Alendronate Sodium   . Sulfonamide Derivatives    Prior to Admission medications   Medication Sig Start Date End Date Taking? Authorizing Provider  amLODipine (NORVASC) 10 MG tablet TAKE ONE TABLET DAILY FOR BLOOD PRESSURE. 12/07/16  Yes [name], MD  atorvastatin (LIPITOR) 20 MG tablet Take 1 tablet (20 mg total) by mouth daily. 12/07/16  Yes [name], MD  cetirizine (ZYRTEC ALLERGY) 10 MG tablet Take 1 tablet (10 mg total) by mouth daily. 01/27/14  Yes Robyn Haber, MD  esomeprazole (NEXIUM) 20 MG capsule Take 20 mg by mouth daily as needed (indigestion).    Yes [provider]  losartan (COZAAR) 50 MG tablet  Take 1 tablet (50 mg total) by mouth daily. 12/07/16  Yes [name], MD  Multiple Vitamins-Minerals (VISION VITAMINS) TABS Take 1 tablet by mouth daily.   Yes [provider]  mupirocin cream (BACTROBAN) 2 % Apply 1 application 2 (two) times daily topically. 02/08/17  Yes Sagardia, Ines Bloomer, MD  HYDROcodone-acetaminophen (NORCO/VICODIN) 5-325 MG tablet Take 1-2 tablets by mouth every 4 (four) hours as needed for moderate pain. Patient not taking: Reported on 02/08/2017 01/20/16   Shawnee Knapp, MD  ibuprofen (ADVIL,MOTRIN) 600 MG tablet Take 1 tablet (600 mg total) by  mouth 3 (three) times daily. Patient not taking: Reported on 02/08/2017 06/26/15   Larene Pickett, PA-C  meloxicam (MOBIC) 7.5 MG tablet Take 1 tablet (7.5 mg total) by mouth daily. Patient not taking: Reported on 02/08/2017 01/20/16   Shawnee Knapp, MD  methocarbamol (ROBAXIN) 500 MG tablet Take 1 tablet (500 mg total) by mouth 4 (four) times daily. Patient not taking: Reported on 02/08/2017 01/20/16   Shawnee Knapp, MD   Social History   Socioeconomic History  . Marital status: Married    Spouse name: Not on file  . Number of children: Not on file  . Years of education: Not on file  . Highest education level: Not on file  Occupational History  . Not on file  Social Needs  . Financial resource strain: Not on file  . Food insecurity:    Worry: Not on file    Inability: Not on file  . Transportation needs:    Medical: Not on file    Non-medical: Not on file  Tobacco Use  . Smoking status: Never Smoker  . Smokeless tobacco: Never Used  Substance and Sexual Activity  . Alcohol use: No  . Drug use: No  . Sexual activity: Yes    Birth control/protection: None  Lifestyle  . Physical activity:    Days per week: Not on file    Minutes per session: Not on file  . Stress: Not on file  Relationships  . Social connections:    Talks on phone: Not on file    Gets together: Not on file    Attends religious service: Not on file    Active member of club or organization: Not on file    Attends meetings of clubs or organizations: Not on file    Relationship status: Not on file  . Intimate partner violence:    Fear of current or ex partner: Not on file    Emotionally abused: Not on file    Physically abused: Not on file    Forced sexual activity: Not on file  Other Topics Concern  . Not on file  Social History Narrative  . Not on file    Review of Systems  Constitutional: Negative for chills and fever.  HENT: Positive for congestion, ear pain, hearing loss (left. ), sinus pressure and  sinus pain.        Objective:   Physical Exam  Constitutional: She is oriented to person, place, and time. She appears well-developed and well-nourished. No distress.  HENT:  Head: Normocephalic and atraumatic.  Right Ear: Hearing, tympanic membrane and ear canal normal.  Left Ear: Hearing, tympanic membrane and ear canal normal.  Nose: Right sinus exhibits maxillary sinus tenderness and frontal sinus tenderness. Left sinus exhibits maxillary sinus tenderness and frontal sinus tenderness.  Mouth/Throat: Oropharynx is clear and moist. No oropharyngeal exudate.  Erythema with slight irritated/scaly skin at the  entrance of bilateral ear canals, narrowing of bilateral canals, tender on left, unable to visualize TM on left with some cerumen.  No apparent exudate.    Eyes: Pupils are equal, round, and reactive to light. Conjunctivae and EOM are normal.  Cardiovascular: Normal rate, regular rhythm, normal heart sounds and intact distal pulses.  No murmur heard. Pulmonary/Chest: Effort normal and breath sounds normal. No respiratory distress. She has no wheezes. She has no rhonchi.  Neurological: She is alert and oriented to person, place, and time.  Skin: Skin is warm and dry. No rash noted.  Psychiatric: She has a normal mood and affect. Her behavior is normal.  Vitals reviewed.  Vitals:   01/14/18 1508 01/14/18 1525  BP: (!) 148/94 126/68  Pulse: 90   Resp: 16   Temp: 98.4 F (36.9 C)   TempSrc: Oral   SpO2: 98%   Weight: 154 lb (69.9 kg)   Height: 5' (1.524 m)       Assessment & Plan:    Jessica Stephens is a 67 y.o. female Nasal congestion - Plan: fluticasone (FLONASE) 50 MCG/ACT nasal spraySinusitis, unspecified chronicity, unspecified location - Plan: amoxicillin-clavulanate (AUGMENTIN) 875-125 MG tablet  -Sinus congestion, component of eustachian tube dysfunction.  Less likely early sinusitis.  Start Flonase.  Augmentin prescription given if not improving within the next  week - side effects discussed, avoid decongestant nasal sprays, RTC precautions.  Potential side effects of meds discussed.  Left ear pain - Plan: neomycin-polymyxin-hydrocortisone (CORTISPORIN) 3.5-10000-1 OTIC suspension Abrasion of left ear, initial encounter - Plan: neomycin-polymyxin-hydrocortisone (CORTISPORIN) 3.5-10000-1 OTIC suspension  -May have component of eustachian tube dysfunction.  Flonase should help, cortisporin otic suspension for abraded area on ear canal.  Avoid Q-tips, RTC precautions given.   Meds ordered this encounter  Medications  . neomycin-polymyxin-hydrocortisone (CORTISPORIN) 3.5-10000-1 OTIC suspension    Sig: Place 3 drops into the left ear 3 (three) times daily.    Dispense:  10 mL    Refill:  0  . amoxicillin-clavulanate (AUGMENTIN) 875-125 MG tablet    Sig: Take 1 tablet by mouth 2 (two) times daily.    Dispense:  20 tablet    Refill:  0  . fluticasone (FLONASE) 50 MCG/ACT nasal spray    Sig: Place 2 sprays into both nostrils daily.    Dispense:  16 g    Refill:  6   Patient Instructions   Popping and clicking in the ear may be due to eustachian tube dysfunction.  See information below.  Start Flonase nasal spray 2 sprays in each nostril once per day to help with possible allergy cause and congestion contributing to both sinus symptoms and ear symptoms.  Irritation on the outside of the ear may be from use of cotton-tipped swabs.  Avoid use of any cotton swabs or other objects inside the ear canal.  Use the eardrops 3 times per day for the next 1 week.  Okay to use Eucerin or other small amount of lotion on the external ear for dry/irritated area.  If ears are not improving in next 10 days - return for recheck.   If sinus symptoms are not improving in 1 week, and I did write a prescription for an antibiotic, but less likely bacterial at this time. Return to the clinic or go to the nearest emergency room if any of your symptoms worsen or new symptoms  occur.  Avoid use of Afrin or other decongestant nasal sprays like Vicks if at all possible. Saline  or salt water nasal spray throughout the day is fine.  If you would like to change to different blood pressure med, let me know and I can send in a replacement. Please schedule appointment to review your other chronic medications as soon as possible. Thank you for coming in today.   If you have lab work done today you will be contacted with your lab results within the next 2 weeks.  If you have not heard from [place] then please contact us. The fastest way to get your results is to register for My Chart.  Eustachian Tube Dysfunction The eustachian tube connects the middle ear to the back of the nose. It regulates air pressure in the middle ear by allowing air to move between the ear and nose. It also helps to drain fluid from the middle ear space. When the eustachian tube does not function properly, air pressure, fluid, or both can build up in the middle ear. Eustachian tube dysfunction can affect one or both ears. What are the causes? This condition happens when the eustachian tube becomes blocked or cannot open normally. This may result from:  Ear infections.  Colds and other upper respiratory infections.  Allergies.  Irritation, such as from cigarette smoke or acid from the stomach coming up into the esophagus (gastroesophageal reflux).  Sudden changes in air pressure, such as from descending in an airplane.  Abnormal growths in the nose or throat, such as nasal polyps, tumors, or enlarged tissue at the back of the throat (adenoids).  What increases the risk? This condition may be more likely to develop in people who smoke and people who are overweight. Eustachian tube dysfunction may also be more likely to develop in children, especially children who have:  Certain birth defects of the mouth, such as cleft palate.  Large tonsils and adenoids.  What are the signs or symptoms? Symptoms  of this condition may include:  A feeling of fullness in the ear.  Ear pain.  Clicking or popping noises in the ear.  Ringing in the ear.  Hearing loss.  Loss of balance.  Symptoms may get worse when the air pressure around you changes, such as when you travel to an area of high elevation or fly on an airplane. How is this diagnosed? This condition may be diagnosed based on:  Your symptoms.  A physical exam of your ear, nose, and throat.  Tests, such as those that measure: ? The movement of your eardrum (tympanogram). ? Your hearing (audiometry).  How is this treated? Treatment depends on the cause and severity of your condition. If your symptoms are mild, you may be able to relieve your symptoms by moving air into ("popping") your ears. If you have symptoms of fluid in your ears, treatment may include:  Decongestants.  Antihistamines.  Nasal sprays or ear drops that contain medicines that reduce swelling (steroids).  In some cases, you may need to have a procedure to drain the fluid in your eardrum (myringotomy). In this procedure, a small tube is placed in the eardrum to:  Drain the fluid.  Restore the air in the middle ear space.  Follow these instructions at home:  Take over-the-counter and prescription medicines only as told by your health care provider.  Use techniques to help pop your ears as recommended by your health care provider. These may include: ? Chewing gum. ? Yawning. ? Frequent, forceful swallowing. ? Closing your mouth, holding your nose closed, and gently blowing as if you are  trying to blow air out of your nose.  Do not do any of the following until your health care provider approves: ? Travel to high altitudes. ? Fly in airplanes. ? Work in a Pension scheme manager or room. ? Scuba dive.  Keep your ears dry. Dry your ears completely after showering or bathing.  Do not smoke.  Keep all follow-up visits as told by your health care provider.  This is important. Contact a health care provider if:  Your symptoms do not go away after treatment.  Your symptoms come back after treatment.  You are unable to pop your ears.  You have: ? A fever. ? Pain in your ear. ? Pain in your head or neck. ? Fluid draining from your ear.  Your hearing suddenly changes.  You become very dizzy.  You lose your balance. This information is not intended to replace advice given to you by your health care provider. Make sure you discuss any questions you have with your health care provider. Document Released: 04/15/2015 Document Revised: 08/25/2015 Document Reviewed: 04/07/2014 Elsevier Interactive Patient Education  2018 [employer].   Sinusitis, Adult Sinusitis is soreness and inflammation of your sinuses. Sinuses are hollow spaces in the bones around your face. Your sinuses are located:  Around your eyes.  In the middle of your forehead.  Behind your nose.  In your cheekbones.  Your sinuses and nasal passages are lined with a stringy fluid (mucus). Mucus normally drains out of your sinuses. When your nasal tissues become inflamed or swollen, the mucus can become trapped or blocked so air cannot flow through your sinuses. This allows bacteria, viruses, and funguses to grow, which leads to infection. Sinusitis can develop quickly and last for 7?10 days (acute) or for more than 12 weeks (chronic). Sinusitis often develops after a cold. What are the causes? This condition is caused by anything that creates swelling in the sinuses or stops mucus from draining, including:  Allergies.  Asthma.  Bacterial or viral infection.  Abnormally shaped bones between the nasal passages.  Nasal growths that contain mucus (nasal polyps).  Narrow sinus openings.  Pollutants, such as chemicals or irritants in the air.  A foreign object stuck in the nose.  A fungal infection. This is rare.  What increases the risk? The following factors may  make you more likely to develop this condition:  Having allergies or asthma.  Having had a recent cold or respiratory tract infection.  Having structural deformities or blockages in your nose or sinuses.  Having a weak immune system.  Doing a lot of swimming or diving.  Overusing nasal sprays.  Smoking.  What are the signs or symptoms? The main symptoms of this condition are pain and a feeling of pressure around the affected sinuses. Other symptoms include:  Upper toothache.  Earache.  Headache.  Bad breath.  Decreased sense of smell and taste.  A cough that may get worse at night.  Fatigue.  Fever.  Thick drainage from your nose. The drainage is often green and it may contain pus (purulent).  Stuffy nose or congestion.  Postnasal drip. This is when extra mucus collects in the throat or back of the nose.  Swelling and warmth over the affected sinuses.  Sore throat.  Sensitivity to light.  How is this diagnosed? This condition is diagnosed based on symptoms, a medical history, and a physical exam. To find out if your condition is acute or chronic, your health care provider may:  Look in  your nose for signs of nasal polyps.  Tap over the affected sinus to check for signs of infection.  View the inside of your sinuses using an imaging device that has a light attached (endoscope).  If your health care provider suspects that you have chronic sinusitis, you may also:  Be tested for allergies.  Have a sample of mucus taken from your nose (nasal culture) and checked for bacteria.  Have a mucus sample examined to see if your sinusitis is related to an allergy.  If your sinusitis does not respond to treatment and it lasts longer than 8 weeks, you may have an MRI or CT scan to check your sinuses. These scans also help to determine how severe your infection is. In rare cases, a bone biopsy may be done to rule out more serious types of fungal sinus disease. How is  this treated? Treatment for sinusitis depends on the cause and whether your condition is chronic or acute. If a virus is causing your sinusitis, your symptoms will go away on their own within 10 days. You may be given medicines to relieve your symptoms, including:  Topical nasal decongestants. They shrink swollen nasal passages and let mucus drain from your sinuses.  Antihistamines. These drugs block inflammation that is triggered by allergies. This can help to ease swelling in your nose and sinuses.  Topical nasal corticosteroids. These are nasal sprays that ease inflammation and swelling in your nose and sinuses.  Nasal saline washes. These rinses can help to get rid of thick mucus in your nose.  If your condition is caused by bacteria, you will be given an antibiotic medicine. If your condition is caused by a fungus, you will be given an antifungal medicine. Surgery may be needed to correct underlying conditions, such as narrow nasal passages. Surgery may also be needed to remove polyps. Follow these instructions at home: Medicines  Take, use, or apply over-the-counter and prescription medicines only as told by your health care provider. These may include nasal sprays.  If you were prescribed an antibiotic medicine, take it as told by your health care provider. Do not stop taking the antibiotic even if you start to feel better. Hydrate and Humidify  Drink enough water to keep your urine clear or pale yellow. Staying hydrated will help to thin your mucus.  Use a cool mist humidifier to keep the humidity level in your home above 50%.  Inhale steam for 10-15 minutes, 3-4 times a day or as told by your health care provider. You can do this in the bathroom while a hot shower is running.  Limit your exposure to cool or dry air. Rest  Rest as much as possible.  Sleep with your head raised (elevated).  Make sure to get enough sleep each night. General instructions  Apply a warm, moist  washcloth to your face 3-4 times a day or as told by your health care provider. This will help with discomfort.  Wash your hands often with soap and water to reduce your exposure to viruses and other germs. If soap and water are not available, use hand sanitizer.  Do not smoke. Avoid being around people who are smoking (secondhand smoke).  Keep all follow-up visits as told by your health care provider. This is important. Contact a health care provider if:  You have a fever.  Your symptoms get worse.  Your symptoms do not improve within 10 days. Get help right away if:  You have a severe headache.  You have persistent vomiting.  You have pain or swelling around your face or eyes.  You have vision problems.  You develop confusion.  Your neck is stiff.  You have trouble breathing. This information is not intended to replace advice given to you by your health care provider. Make sure you discuss any questions you have with your health care provider. Document Released: 03/19/2005 Document Revised: 11/13/2015 Document Reviewed: 01/12/2015 Elsevier Interactive Patient Education  2018 [employer].   IF you received an x-ray today, you will receive an invoice from [facility]. Please contact [facility] at [phone] with questions or concerns regarding your invoice.   IF you received labwork today, you will receive an invoice from [facility]. Please contact [facility] at [phone] with questions or concerns regarding your invoice.   Our billing staff will not be able to assist you with questions regarding bills from these companies.  You will be contacted with the lab results as soon as they are available. The fastest way to get your results is to activate your My Chart account. Instructions are located on the last page of this paperwork. If you have not heard from [place] regarding the results in 2 weeks, please contact this office.       Signed,   Merri Ray, MD [facility] [place].  01/18/18 11:26 AM

## 2018-01-14 NOTE — Patient Instructions (Addendum)
Popping and clicking in the ear may be due to eustachian tube dysfunction.  See information below.  Start Flonase nasal spray 2 sprays in each nostril once per day to help with possible allergy cause and congestion contributing to both sinus symptoms and ear symptoms.  Irritation on the outside of the ear may be from use of cotton-tipped swabs.  Avoid use of any cotton swabs or other objects inside the ear canal.  Use the eardrops 3 times per day for the next 1 week.  Okay to use Eucerin or other small amount of lotion on the external ear for dry/irritated area.  If ears are not improving in next 10 days - return for recheck.   If sinus symptoms are not improving in 1 week, and I did write a prescription for an antibiotic, but less likely bacterial at this time. Return to the clinic or go to the nearest emergency room if any of your symptoms worsen or new symptoms occur.  Avoid use of Afrin or other decongestant nasal sprays like Vicks if at all possible. Saline or salt water nasal spray throughout the day is fine.  If you would like to change to different blood pressure med, let me know and I can send in a replacement. Please schedule appointment to review your other chronic medications as soon as possible. Thank you for coming in today.   If you have lab work done today you will be contacted with your lab results within the next 2 weeks.  If you have not heard from [place] then please contact us. The fastest way to get your results is to register for My Chart.  Eustachian Tube Dysfunction The eustachian tube connects the middle ear to the back of the nose. It regulates air pressure in the middle ear by allowing air to move between the ear and nose. It also helps to drain fluid from the middle ear space. When the eustachian tube does not function properly, air pressure, fluid, or both can build up in the middle ear. Eustachian tube dysfunction can affect one or both ears. What are the causes? This  condition happens when the eustachian tube becomes blocked or cannot open normally. This may result from:  Ear infections.  Colds and other upper respiratory infections.  Allergies.  Irritation, such as from cigarette smoke or acid from the stomach coming up into the esophagus (gastroesophageal reflux).  Sudden changes in air pressure, such as from descending in an airplane.  Abnormal growths in the nose or throat, such as nasal polyps, tumors, or enlarged tissue at the back of the throat (adenoids).  What increases the risk? This condition may be more likely to develop in people who smoke and people who are overweight. Eustachian tube dysfunction may also be more likely to develop in children, especially children who have:  Certain birth defects of the mouth, such as cleft palate.  Large tonsils and adenoids.  What are the signs or symptoms? Symptoms of this condition may include:  A feeling of fullness in the ear.  Ear pain.  Clicking or popping noises in the ear.  Ringing in the ear.  Hearing loss.  Loss of balance.  Symptoms may get worse when the air pressure around you changes, such as when you travel to an area of high elevation or fly on an airplane. How is this diagnosed? This condition may be diagnosed based on:  Your symptoms.  A physical exam of your ear, nose, and throat.  Tests, such  as those that measure: ? The movement of your eardrum (tympanogram). ? Your hearing (audiometry).  How is this treated? Treatment depends on the cause and severity of your condition. If your symptoms are mild, you may be able to relieve your symptoms by moving air into ("popping") your ears. If you have symptoms of fluid in your ears, treatment may include:  Decongestants.  Antihistamines.  Nasal sprays or ear drops that contain medicines that reduce swelling (steroids).  In some cases, you may need to have a procedure to drain the fluid in your eardrum  (myringotomy). In this procedure, a small tube is placed in the eardrum to:  Drain the fluid.  Restore the air in the middle ear space.  Follow these instructions at home:  Take over-the-counter and prescription medicines only as told by your health care provider.  Use techniques to help pop your ears as recommended by your health care provider. These may include: ? Chewing gum. ? Yawning. ? Frequent, forceful swallowing. ? Closing your mouth, holding your nose closed, and gently blowing as if you are trying to blow air out of your nose.  Do not do any of the following until your health care provider approves: ? Travel to high altitudes. ? Fly in airplanes. ? Work in a Pension scheme manager or room. ? Scuba dive.  Keep your ears dry. Dry your ears completely after showering or bathing.  Do not smoke.  Keep all follow-up visits as told by your health care provider. This is important. Contact a health care provider if:  Your symptoms do not go away after treatment.  Your symptoms come back after treatment.  You are unable to pop your ears.  You have: ? A fever. ? Pain in your ear. ? Pain in your head or neck. ? Fluid draining from your ear.  Your hearing suddenly changes.  You become very dizzy.  You lose your balance. This information is not intended to replace advice given to you by your health care provider. Make sure you discuss any questions you have with your health care provider. Document Released: 04/15/2015 Document Revised: 08/25/2015 Document Reviewed: 04/07/2014 Elsevier Interactive Patient Education  2018 [employer].   Sinusitis, Adult Sinusitis is soreness and inflammation of your sinuses. Sinuses are hollow spaces in the bones around your face. Your sinuses are located:  Around your eyes.  In the middle of your forehead.  Behind your nose.  In your cheekbones.  Your sinuses and nasal passages are lined with a stringy fluid (mucus). Mucus  normally drains out of your sinuses. When your nasal tissues become inflamed or swollen, the mucus can become trapped or blocked so air cannot flow through your sinuses. This allows bacteria, viruses, and funguses to grow, which leads to infection. Sinusitis can develop quickly and last for 7?10 days (acute) or for more than 12 weeks (chronic). Sinusitis often develops after a cold. What are the causes? This condition is caused by anything that creates swelling in the sinuses or stops mucus from draining, including:  Allergies.  Asthma.  Bacterial or viral infection.  Abnormally shaped bones between the nasal passages.  Nasal growths that contain mucus (nasal polyps).  Narrow sinus openings.  Pollutants, such as chemicals or irritants in the air.  A foreign object stuck in the nose.  A fungal infection. This is rare.  What increases the risk? The following factors may make you more likely to develop this condition:  Having allergies or asthma.  Having had a  recent cold or respiratory tract infection.  Having structural deformities or blockages in your nose or sinuses.  Having a weak immune system.  Doing a lot of swimming or diving.  Overusing nasal sprays.  Smoking.  What are the signs or symptoms? The main symptoms of this condition are pain and a feeling of pressure around the affected sinuses. Other symptoms include:  Upper toothache.  Earache.  Headache.  Bad breath.  Decreased sense of smell and taste.  A cough that may get worse at night.  Fatigue.  Fever.  Thick drainage from your nose. The drainage is often green and it may contain pus (purulent).  Stuffy nose or congestion.  Postnasal drip. This is when extra mucus collects in the throat or back of the nose.  Swelling and warmth over the affected sinuses.  Sore throat.  Sensitivity to light.  How is this diagnosed? This condition is diagnosed based on symptoms, a medical history, and a  physical exam. To find out if your condition is acute or chronic, your health care provider may:  Look in your nose for signs of nasal polyps.  Tap over the affected sinus to check for signs of infection.  View the inside of your sinuses using an imaging device that has a light attached (endoscope).  If your health care provider suspects that you have chronic sinusitis, you may also:  Be tested for allergies.  Have a sample of mucus taken from your nose (nasal culture) and checked for bacteria.  Have a mucus sample examined to see if your sinusitis is related to an allergy.  If your sinusitis does not respond to treatment and it lasts longer than 8 weeks, you may have an MRI or CT scan to check your sinuses. These scans also help to determine how severe your infection is. In rare cases, a bone biopsy may be done to rule out more serious types of fungal sinus disease. How is this treated? Treatment for sinusitis depends on the cause and whether your condition is chronic or acute. If a virus is causing your sinusitis, your symptoms will go away on their own within 10 days. You may be given medicines to relieve your symptoms, including:  Topical nasal decongestants. They shrink swollen nasal passages and let mucus drain from your sinuses.  Antihistamines. These drugs block inflammation that is triggered by allergies. This can help to ease swelling in your nose and sinuses.  Topical nasal corticosteroids. These are nasal sprays that ease inflammation and swelling in your nose and sinuses.  Nasal saline washes. These rinses can help to get rid of thick mucus in your nose.  If your condition is caused by bacteria, you will be given an antibiotic medicine. If your condition is caused by a fungus, you will be given an antifungal medicine. Surgery may be needed to correct underlying conditions, such as narrow nasal passages. Surgery may also be needed to remove polyps. Follow these instructions  at home: Medicines  Take, use, or apply over-the-counter and prescription medicines only as told by your health care provider. These may include nasal sprays.  If you were prescribed an antibiotic medicine, take it as told by your health care provider. Do not stop taking the antibiotic even if you start to feel better. Hydrate and Humidify  Drink enough water to keep your urine clear or pale yellow. Staying hydrated will help to thin your mucus.  Use a cool mist humidifier to keep the humidity level in your home above  50%.  Inhale steam for 10-15 minutes, 3-4 times a day or as told by your health care provider. You can do this in the bathroom while a hot shower is running.  Limit your exposure to cool or dry air. Rest  Rest as much as possible.  Sleep with your head raised (elevated).  Make sure to get enough sleep each night. General instructions  Apply a warm, moist washcloth to your face 3-4 times a day or as told by your health care provider. This will help with discomfort.  Wash your hands often with soap and water to reduce your exposure to viruses and other germs. If soap and water are not available, use hand sanitizer.  Do not smoke. Avoid being around people who are smoking (secondhand smoke).  Keep all follow-up visits as told by your health care provider. This is important. Contact a health care provider if:  You have a fever.  Your symptoms get worse.  Your symptoms do not improve within 10 days. Get help right away if:  You have a severe headache.  You have persistent vomiting.  You have pain or swelling around your face or eyes.  You have vision problems.  You develop confusion.  Your neck is stiff.  You have trouble breathing. This information is not intended to replace advice given to you by your health care provider. Make sure you discuss any questions you have with your health care provider. Document Released: 03/19/2005 Document Revised:  11/13/2015 Document Reviewed: 01/12/2015 Elsevier Interactive Patient Education  2018 [employer].   IF you received an x-ray today, you will receive an invoice from [facility]. Please contact [facility] at [phone] with questions or concerns regarding your invoice.   IF you received labwork today, you will receive an invoice from [facility]. Please contact [facility] at [phone] with questions or concerns regarding your invoice.   Our billing staff will not be able to assist you with questions regarding bills from these companies.  You will be contacted with the lab results as soon as they are available. The fastest way to get your results is to activate your My Chart account. Instructions are located on the last page of this paperwork. If you have not heard from [place] regarding the results in 2 weeks, please contact this office.

## 2018-01-18 ENCOUNTER — Encounter: Payer: Self-pay | Admitting: Family Medicine

## 2018-01-26 ENCOUNTER — Encounter: Payer: Self-pay | Admitting: Family Medicine

## 2018-02-08 ENCOUNTER — Other Ambulatory Visit: Payer: Self-pay | Admitting: Family Medicine

## 2018-02-08 DIAGNOSIS — E785 Hyperlipidemia, unspecified: Secondary | ICD-10-CM

## 2018-02-08 DIAGNOSIS — I10 Essential (primary) hypertension: Secondary | ICD-10-CM

## 2018-02-10 NOTE — Telephone Encounter (Signed)
Requested medication (s) are due for refill today: yes  Requested medication (s) are on the active medication list: yes  Last refill:  Both refilled on 12/07/16 for 90 tabs and 3 refills  Future visit scheduled: no  Notes to clinic:  [facility] 01/14/18. Both prescriptions expired on 12/07/17. Labs are over due  Requested Prescriptions  Pending Prescriptions Disp Refills   atorvastatin (LIPITOR) 20 MG tablet [Pharmacy Med Name: ATORVASTATIN 20 MG TABLET] 90 tablet 3    Sig: TAKE 1 TABLET BY MOUTH EVERY DAY     Cardiovascular:  Antilipid - Statins Failed - 02/08/2018  3:07 PM      Failed - Total Cholesterol in normal range and within 360 days    Cholesterol, Total  Date Value Ref Range Status  12/24/2016 141 100 - 199 mg/dL Final         Failed - LDL in normal range and within 360 days    LDL Calculated  Date Value Ref Range Status  12/24/2016 70 0 - 99 mg/dL Final         Failed - HDL in normal range and within 360 days    HDL  Date Value Ref Range Status  12/24/2016 58 >39 mg/dL Final         Failed - Triglycerides in normal range and within 360 days    Triglycerides  Date Value Ref Range Status  12/24/2016 66 0 - 149 mg/dL Final         Passed - Patient is not pregnant      Passed - Valid encounter within last 12 months    Recent Outpatient Visits          3 weeks ago Nasal congestion   [facility], Ranell Patrick, MD   1 year ago Cellulitis of left thumb   [facility], Ines Bloomer, MD   1 year ago Hyperlipidemia, unspecified hyperlipidemia type   [facility], Ranell Patrick, MD   1 year ago Annual physical exam   [facility], Ranell Patrick, MD   1 year ago Acute UTI   [facility], [place], MD            amLODipine ([place]) 10 MG tablet [Pharmacy Med Name: AMLODIPINE BESYLATE 10 MG TAB] 90 tablet 3    Sig: TAKE 1 TABLET BY MOUTH DAILY FOR BLOOD PRESSURE     Cardiovascular:   Calcium Channel Blockers Passed - 02/08/2018  3:07 PM      Passed - Last BP in normal range    BP Readings from Last 1 Encounters:  01/14/18 126/68         Passed - Valid encounter within last 6 months    Recent Outpatient Visits          3 weeks ago Nasal congestion   [facility], Ranell Patrick, MD   1 year ago Cellulitis of left thumb   [facility], Ines Bloomer, MD   1 year ago Hyperlipidemia, unspecified hyperlipidemia type   [facility], Ranell Patrick, MD   1 year ago Annual physical exam   [facility], Ranell Patrick, MD   1 year ago Acute UTI   [facility] at [facility], Ines Bloomer, MD

## 2018-02-11 NOTE — Telephone Encounter (Signed)
Pt has made an appt to be seen.  Pt hopes she can get a refill to get her through to this appt, 02/18/18 at 9:40 am

## 2018-02-18 ENCOUNTER — Encounter: Payer: Self-pay | Admitting: Family Medicine

## 2018-02-18 ENCOUNTER — Ambulatory Visit (INDEPENDENT_AMBULATORY_CARE_PROVIDER_SITE_OTHER): Payer: PPO | Admitting: Family Medicine

## 2018-02-18 ENCOUNTER — Other Ambulatory Visit: Payer: Self-pay

## 2018-02-18 VITALS — BP 150/62 | HR 68 | Temp 97.9°F | Ht 60.0 in | Wt 155.8 lb

## 2018-02-18 DIAGNOSIS — E785 Hyperlipidemia, unspecified: Secondary | ICD-10-CM | POA: Diagnosis not present

## 2018-02-18 DIAGNOSIS — I10 Essential (primary) hypertension: Secondary | ICD-10-CM | POA: Diagnosis not present

## 2018-02-18 DIAGNOSIS — J029 Acute pharyngitis, unspecified: Secondary | ICD-10-CM | POA: Diagnosis not present

## 2018-02-18 DIAGNOSIS — R42 Dizziness and giddiness: Secondary | ICD-10-CM

## 2018-02-18 DIAGNOSIS — J069 Acute upper respiratory infection, unspecified: Secondary | ICD-10-CM | POA: Diagnosis not present

## 2018-02-18 DIAGNOSIS — G4700 Insomnia, unspecified: Secondary | ICD-10-CM | POA: Diagnosis not present

## 2018-02-18 LAB — POCT RAPID STREP A (OFFICE): Rapid Strep A Screen: NEGATIVE

## 2018-02-18 MED ORDER — AMLODIPINE BESYLATE 10 MG PO TABS: ORAL_TABLET | ORAL | 0 refills | Status: DC

## 2018-02-18 MED ORDER — OLMESARTAN MEDOXOMIL 20 MG PO TABS: 20.0000 mg | ORAL_TABLET | Freq: Every day | ORAL | 1 refills | Status: DC

## 2018-02-18 MED ORDER — ATORVASTATIN CALCIUM 20 MG PO TABS: 20.0000 mg | ORAL_TABLET | Freq: Every day | ORAL | 0 refills | Status: DC

## 2018-02-18 NOTE — Patient Instructions (Addendum)
Try melatonin, other recommendations for sleep below. Avoid any caffeine after lunch including chocolate.  If not improved in next few weeks - schedule an appointment to look at other treatment.  I changed the losartan to olmesartan.  Let me know if that is too costly.  Recheck in the next 1 month with home blood pressure readings.  Current sore throat is likely due to a virus.  See information below.Return to the clinic or go to the nearest emergency room if any of your symptoms worsen or new symptoms occur.  Make sure to stay hydrated throughout the day.  When you first stand up, do so slowly and see if that helps with dizziness.  If you have any dizziness do not work from [employer].  If dizziness symptoms return, please follow-up to discuss the symptoms further.  Return to the clinic or go to the nearest emergency room if any of your symptoms worsen or new symptoms occur.   Sore Throat A sore throat is pain, burning, irritation, or scratchiness in the throat. When you have a sore throat, you may feel pain or tenderness in your throat when you swallow or talk. Many things can cause a sore throat, including:  An infection.  Seasonal allergies.  Dryness in the air.  Irritants, such as smoke or pollution.  Gastroesophageal reflux disease (GERD).  A tumor.  A sore throat is often the first sign of another sickness. It may happen with other symptoms, such as coughing, sneezing, fever, and swollen neck glands. Most sore throats go away without medical treatment. Follow these instructions at home:  Take over-the-counter medicines only as told by your health care provider.  Drink enough fluids to keep your urine clear or pale yellow.  Rest as needed.  To help with pain, try: ? Sipping warm liquids, such as broth, herbal tea, or warm water. ? Eating or drinking cold or frozen liquids, such as frozen ice pops. ? Gargling with a salt-water mixture 3-4 times a day or as needed. To make a  salt-water mixture, completely dissolve -1 tsp of salt in 1 cup of warm water. ? Sucking on hard candy or throat lozenges. ? Putting a cool-mist humidifier in your bedroom at night to moisten the air. ? Sitting in the bathroom with the door closed for 5-10 minutes while you run hot water in the shower.  Do not use any tobacco products, such as cigarettes, chewing tobacco, and e-cigarettes. If you need help quitting, ask your health care provider. Contact a health care provider if:  You have a fever for more than 2-3 days.  You have symptoms that last (are persistent) for more than 2-3 days.  Your throat does not get better within 7 days.  You have a fever and your symptoms suddenly get worse. Get help right away if:  You have difficulty breathing.  You cannot swallow fluids, soft foods, or your saliva.  You have increased swelling in your throat or neck.  You have persistent nausea and vomiting. This information is not intended to replace advice given to you by your health care provider. Make sure you discuss any questions you have with your health care provider. Document Released: 04/26/2004 Document Revised: 11/13/2015 Document Reviewed: 01/07/2015 Elsevier Interactive Patient Education  2018 [place].  Upper Respiratory Infection, Adult Most upper respiratory infections (URIs) are caused by a virus. A URI affects the nose, throat, and upper air passages. The most common type of URI is often called "the common cold." Follow  these instructions at home:  Take medicines only as told by your doctor.  Gargle warm saltwater or take cough drops to comfort your throat as told by your doctor.  Use a warm mist humidifier or inhale steam from a shower to increase air moisture. This may make it easier to breathe.  Drink enough fluid to keep your pee (urine) clear or pale yellow.  Eat soups and other clear broths.  Have a healthy diet.  Rest as needed.  Go back to work when  your fever is gone or your doctor says it is okay. ? You may need to stay home longer to avoid giving your URI to others. ? You can also wear a face mask and wash your hands often to prevent spread of the virus.  Use your inhaler more if you have asthma.  Do not use any tobacco products, including cigarettes, chewing tobacco, or electronic cigarettes. If you need help quitting, ask your doctor. Contact a doctor if:  You are getting worse, not better.  Your symptoms are not helped by medicine.  You have chills.  You are getting more short of breath.  You have brown or red mucus.  You have yellow or brown discharge from your nose.  You have pain in your face, especially when you bend forward.  You have a fever.  You have puffy (swollen) neck glands.  You have pain while swallowing.  You have white areas in the back of your throat. Get help right away if:  You have very bad or constant: ? Headache. ? Ear pain. ? Pain in your forehead, behind your eyes, and over your cheekbones (sinus pain). ? Chest pain.  You have long-lasting (chronic) lung disease and any of the following: ? Wheezing. ? Long-lasting cough. ? Coughing up blood. ? A change in your usual mucus.  You have a stiff neck.  You have changes in your: ? Vision. ? Hearing. ? Thinking. ? Mood. This information is not intended to replace advice given to you by your health care provider. Make sure you discuss any questions you have with your health care provider. Document Released: 09/05/2007 Document Revised: 11/20/2015 Document Reviewed: 06/24/2013 Elsevier Interactive Patient Education  2018 [employer].    Insomnia Insomnia is a sleep disorder that makes it difficult to fall asleep or to stay asleep. Insomnia can cause tiredness (fatigue), low energy, difficulty concentrating, mood swings, and poor performance at work or school. There are three different ways to classify insomnia:  Difficulty  falling asleep.  Difficulty staying asleep.  Waking up too early in the morning.  Any type of insomnia can be long-term (chronic) or short-term (acute). Both are common. Short-term insomnia usually lasts for three months or less. Chronic insomnia occurs at least three times a week for longer than three months. What are the causes? Insomnia may be caused by another condition, situation, or substance, such as:  Anxiety.  Certain medicines.  Gastroesophageal reflux disease (GERD) or other gastrointestinal conditions.  Asthma or other breathing conditions.  Restless legs syndrome, sleep apnea, or other sleep disorders.  Chronic pain.  Menopause. This may include hot flashes.  Stroke.  Abuse of alcohol, tobacco, or illegal drugs.  Depression.  Caffeine.  Neurological disorders, such as Alzheimer disease.  An overactive thyroid (hyperthyroidism).  The cause of insomnia may not be known. What increases the risk? Risk factors for insomnia include:  Gender. Women are more commonly affected than men.  Age. Insomnia is more common as you  get older.  Stress. This may involve your professional or personal life.  Income. Insomnia is more common in people with lower income.  Lack of exercise.  Irregular work schedule or night shifts.  Traveling between different time zones.  What are the signs or symptoms? If you have insomnia, trouble falling asleep or trouble staying asleep is the main symptom. This may lead to other symptoms, such as:  Feeling fatigued.  Feeling nervous about going to sleep.  Not feeling rested in the morning.  Having trouble concentrating.  Feeling irritable, anxious, or depressed.  How is this treated? Treatment for insomnia depends on the cause. If your insomnia is caused by an underlying condition, treatment will focus on addressing the condition. Treatment may also include:  Medicines to help you sleep.  Counseling or  therapy.  Lifestyle adjustments.  Follow these instructions at home:  Take medicines only as directed by your health care provider.  Keep regular sleeping and waking hours. Avoid naps.  Keep a sleep diary to help you and your health care provider figure out what could be causing your insomnia. Include: ? When you sleep. ? When you wake up during the night. ? How well you sleep. ? How rested you feel the next day. ? Any side effects of medicines you are taking. ? What you eat and drink.  Make your bedroom a comfortable place where it is easy to fall asleep: ? Put up shades or special blackout curtains to block light from outside. ? Use a white noise machine to block noise. ? Keep the temperature cool.  Exercise regularly as directed by your health care provider. Avoid exercising right before bedtime.  Use relaxation techniques to manage stress. Ask your health care provider to suggest some techniques that may work well for you. These may include: ? Breathing exercises. ? Routines to release muscle tension. ? Visualizing peaceful scenes.  Cut back on alcohol, caffeinated beverages, and cigarettes, especially close to bedtime. These can disrupt your sleep.  Do not overeat or eat spicy foods right before bedtime. This can lead to digestive discomfort that can make it hard for you to sleep.  Limit screen use before bedtime. This includes: ? Watching TV. ? Using your smartphone, tablet, and computer.  Stick to a routine. This can help you fall asleep faster. Try to do a quiet activity, brush your teeth, and go to bed at the same time each night.  Get out of bed if you are still awake after 15 minutes of trying to sleep. Keep the lights down, but try reading or doing a quiet activity. When you feel sleepy, go back to bed.  Make sure that you drive carefully. Avoid driving if you feel very sleepy.  Keep all follow-up appointments as directed by your health care provider. This is  important. Contact a health care provider if:  You are tired throughout the day or have trouble in your daily routine due to sleepiness.  You continue to have sleep problems or your sleep problems get worse. Get help right away if:  You have serious thoughts about hurting yourself or someone else. This information is not intended to replace advice given to you by your health care provider. Make sure you discuss any questions you have with your health care provider. Document Released: 03/16/2000 Document Revised: 08/19/2015 Document Reviewed: 12/18/2013 Elsevier Interactive Patient Education  2018 [employer].  Dizziness Dizziness is a common problem. It is a feeling of unsteadiness or light-headedness. You may  feel like you are about to faint. Dizziness can lead to injury if you stumble or fall. Anyone can become dizzy, but dizziness is more common in older adults. This condition can be caused by a number of things, including medicines, dehydration, or illness. Follow these instructions at home: Eating and drinking  Drink enough fluid to keep your urine clear or pale yellow. This helps to keep you from becoming dehydrated. Try to drink more clear fluids, such as water.  Do not drink alcohol.  Limit your caffeine intake if told to do so by your health care provider. Check ingredients and nutrition facts to see if a food or beverage contains caffeine.  Limit your salt (sodium) intake if told to do so by your health care provider. Check ingredients and nutrition facts to see if a food or beverage contains sodium. Activity  Avoid making quick movements. ? Rise slowly from chairs and steady yourself until you feel okay. ? In the morning, first sit up on the side of the bed. When you feel okay, stand slowly while you hold onto something until you know that your balance is fine.  If you need to stand in one place for a long time, move your legs often. Tighten and relax the muscles in your  legs while you are standing.  Do not drive or use heavy machinery if you feel dizzy.  Avoid bending down if you feel dizzy. Place items in your home so that they are easy for you to reach without leaning over. Lifestyle  Do not use any products that contain nicotine or tobacco, such as cigarettes and e-cigarettes. If you need help quitting, ask your health care provider.  Try to reduce your stress level by using methods such as yoga or meditation. Talk with your health care provider if you need help to manage your stress. General instructions  Watch your dizziness for any changes.  Take over-the-counter and prescription medicines only as told by your health care provider. Talk with your health care provider if you think that your dizziness is caused by a medicine that you are taking.  Tell a friend or a family member that you are feeling dizzy. If he or she notices any changes in your behavior, have this person call your health care provider.  Keep all follow-up visits as told by your health care provider. This is important. Contact a health care provider if:  Your dizziness does not go away.  Your dizziness or light-headedness gets worse.  You feel nauseous.  You have reduced hearing.  You have new symptoms.  You are unsteady on your feet or you feel like the room is spinning. Get help right away if:  You vomit or have diarrhea and are unable to eat or drink anything.  You have problems talking, walking, swallowing, or using your arms, hands, or legs.  You feel generally weak.  You are not thinking clearly or you have trouble forming sentences. It may take a friend or family member to notice this.  You have chest pain, abdominal pain, shortness of breath, or sweating.  Your vision changes.  You have any bleeding.  You have a severe headache.  You have neck pain or a stiff neck.  You have a fever. These symptoms may represent a serious problem that is an  emergency. Do not wait to see if the symptoms will go away. Get medical help right away. Call your local emergency services (911 in the [place] Do not drive yourself  to the hospital. Summary  Dizziness is a feeling of unsteadiness or light-headedness. This condition can be caused by a number of things, including medicines, dehydration, or illness.  Anyone can become dizzy, but dizziness is more common in older adults.  Drink enough fluid to keep your urine clear or pale yellow. Do not drink alcohol.  Avoid making quick movements if you feel dizzy. Monitor your dizziness for any changes. This information is not intended to replace advice given to you by your health care provider. Make sure you discuss any questions you have with your health care provider. Document Released: 09/12/2000 Document Revised: 04/21/2016 Document Reviewed: 04/21/2016 Elsevier Interactive Patient Education  [employer].    If you have lab work done today you will be contacted with your lab results within the next 2 weeks.  If you have not heard from [place] then please contact us. The fastest way to get your results is to register for My Chart.   IF you received an x-ray today, you will receive an invoice from [facility]. Please contact [facility] at [phone] with questions or concerns regarding your invoice.   IF you received labwork today, you will receive an invoice from [place]. Please contact [facility] at [phone] with questions or concerns regarding your invoice.   Our billing staff will not be able to assist you with questions regarding bills from these companies.  You will be contacted with the lab results as soon as they are available. The fastest way to get your results is to activate your My Chart account. Instructions are located on the last page of this paperwork. If you have not heard from [place] regarding the results in 2 weeks, please contact this office.

## 2018-02-18 NOTE — Progress Notes (Addendum)
Subjective:  By signing my name below, I, Jessica Stephens, attest that this documentation has been prepared under the direction and in the presence of Jessica Ray, MD. Electronically Signed: Moises Stephens, [place]. 02/18/2018 , 10:41 AM .  Patient was seen in Room 10 .   Patient ID: Jessica Stephens, female    DOB: 1950/04/28, 67 y.o.   MRN: [id number] Chief Complaint  Patient presents with  . Medication Problem    losartin recall   . Sore Throat    yesterday (trouble swallowing)   HPI Jessica Stephens is a 67 y.o. female Here for follow up. She is fasting this morning.   HTN Patient has a question regarding Losartan recall. She's currently taking Norvasc 10 mg and Losartan 50 mg qd.   BP Readings from Last 3 Encounters:  02/18/18 (!) 150/62  01/14/18 126/68  02/08/17 140/64   Lab Results  Component Value Date   CREATININE 0.79 12/24/2016   Patient mentions she gets dizzy with unsteadiness at times. She isn't sure if it's due to her taking her BP medications in the morning. She reports this has been ongoing for a while, but more in the last year.   Hyperlipidemia Lab Results  Component Value Date   CHOL 141 12/24/2016   HDL 58 12/24/2016   LDLCALC 70 12/24/2016   TRIG 66 12/24/2016   CHOLHDL 2.4 12/24/2016   Lab Results  Component Value Date   ALT 22 12/24/2016   AST 23 12/24/2016   ALKPHOS 115 12/24/2016   BILITOT 0.6 12/24/2016   She takes Lipitor 20 mg qd. She denies any side effects with her Lipitor.   GERD She only takes Nexium as needed, when knowing she's going out to eat.   Insomnia She mentions she hasn't been sleeping well on some nights. She states she wasn't able to sleep 2 nights ago and was awake until 3:00 AM yesterday morning. On some nights, she informs trouble going to sleep and also trouble staying asleep. She notes when she's not at work, she works at home; but at around 11:00 AM, she would start dozing off.   She informs drinking  caffeine late in the afternoon, but she usually doesn't drink caffeine in the afternoon and only in the morning. She does recall eating more chocolate 2 days ago, which her son brought her because it usually calms her down.   Sore Throat She was working yesterday and felt having trouble swallowing yesterday. She was sneezing, and blowing her nose more recently. She noticed some ear ache this morning. She denies congestion, or fever. She drank hot soup last night. She's been taking lemon drops for her sore throat.   Patient Active Problem List   Diagnosis Date Noted  . Lower urinary tract symptoms (LUTS) 12/05/2016  . Acute UTI 12/05/2016  . Flank pain, acute 12/05/2016  . Prolonged Q-T interval on ECG 08/21/2014  . Lobar pneumonia due to unspecified organism 08/21/2014  . CAP (community acquired pneumonia) 08/19/2014  . Community acquired pneumonia 08/19/2014  . Sepsis ([place]) 08/19/2014  . Hypokalemia 08/19/2014  . HYPERLIPIDEMIA 05/29/2010  . DYSPNEA 05/29/2010  . CHEST PAIN, ATYPICAL 05/02/2010  . ESOPHAGEAL STRICTURE 04/07/2010  . HIATAL HERNIA 04/07/2010  . ULCERATIVE PROCTITIS 04/07/2010  . ARTHRITIS 04/07/2010  . LUQ PAIN 04/07/2010  . TRANSAMINASES, SERUM, ELEVATED 04/07/2010  . VITAMIN B12 DEFICIENCY 01/30/2008  . ANXIETY, CHRONIC 01/29/2008  . Essential hypertension 01/29/2008  . CHRONIC MAXILLARY SINUSITIS 04/24/2007  . G E R  D 04/24/2007  . UNSPECIFIED OSTEOPOROSIS 04/24/2007  . ULCERATIVE COLITIS, LEFT SIDED 02/25/2007   Past Medical History:  Diagnosis Date  . Abnormal transaminases   . Anxiety   . Arthritis   . Esophageal stricture   . GERD (gastroesophageal reflux disease)   . Hiatal hernia   . Hyperlipidemia   . Hypertension   . Maxillary sinusitis   . Proctitis   . Ulcerative colitis ([place])    left sided  . Vitamin B12 deficiency    Past Surgical History:  Procedure Laterality Date  . CESAREAN SECTION    . nose surg    . NOSE SURGERY     Allergies   Allergen Reactions  . Alendronate Sodium   . Sulfonamide Derivatives    Prior to Admission medications   Medication Sig Start Date End Date Taking? Authorizing Provider  amLODipine (NORVASC) 10 MG tablet TAKE 1 TABLET BY MOUTH DAILY FOR Stephens PRESSURE 02/13/18   [name], MD  amoxicillin-clavulanate (AUGMENTIN) 875-125 MG tablet Take 1 tablet by mouth 2 (two) times daily. 01/14/18   [name], MD  atorvastatin (LIPITOR) 20 MG tablet TAKE 1 TABLET BY MOUTH EVERY DAY 02/13/18   [name], MD  cetirizine (ZYRTEC ALLERGY) 10 MG tablet Take 1 tablet (10 mg total) by mouth daily. 01/27/14   Robyn Haber, MD  esomeprazole (NEXIUM) 20 MG capsule Take 20 mg by mouth daily as needed (indigestion).     [provider]  fluticasone (FLONASE) 50 MCG/ACT nasal spray Place 2 sprays into both nostrils daily. 01/14/18   [name], MD  HYDROcodone-acetaminophen (NORCO/VICODIN) 5-325 MG tablet Take 1-2 tablets by mouth every 4 (four) hours as needed for moderate pain. Patient not taking: Reported on 02/08/2017 01/20/16   Shawnee Knapp, MD  ibuprofen (ADVIL,MOTRIN) 600 MG tablet Take 1 tablet (600 mg total) by mouth 3 (three) times daily. Patient not taking: Reported on 02/08/2017 06/26/15   Larene Pickett, PA-C  losartan (COZAAR) 50 MG tablet Take 1 tablet (50 mg total) by mouth daily. 12/07/16   [name], MD  meloxicam (MOBIC) 7.5 MG tablet Take 1 tablet (7.5 mg total) by mouth daily. Patient not taking: Reported on 02/08/2017 01/20/16   Shawnee Knapp, MD  methocarbamol (ROBAXIN) 500 MG tablet Take 1 tablet (500 mg total) by mouth 4 (four) times daily. Patient not taking: Reported on 02/08/2017 01/20/16   Shawnee Knapp, MD  Multiple Vitamins-Minerals (VISION VITAMINS) TABS Take 1 tablet by mouth daily.    [provider]  mupirocin cream (BACTROBAN) 2 % Apply 1 application 2 (two) times daily topically. 02/08/17   Horald Pollen, MD    neomycin-polymyxin-hydrocortisone (CORTISPORIN) 3.5-10000-1 OTIC suspension Place 3 drops into the left ear 3 (three) times daily. 01/14/18   [name], MD   Social History   Socioeconomic History  . Marital status: Married    Spouse name: Not on file  . Number of children: Not on file  . Years of education: Not on file  . Highest education level: Not on file  Occupational History  . Not on file  Social Needs  . Financial resource strain: Not on file  . Food insecurity:    Worry: Not on file    Inability: Not on file  . Transportation needs:    Medical: Not on file    Non-medical: Not on file  Tobacco Use  . Smoking status: Never Smoker  . Smokeless tobacco: Never Used  Substance and Sexual Activity  . Alcohol use: No  . Drug use: No  . Sexual activity: Yes    Birth control/protection: None  Lifestyle  . Physical activity:    Days per week: Not on file    Minutes per session: Not on file  . Stress: Not on file  Relationships  . Social connections:    Talks on phone: Not on file    Gets together: Not on file    Attends religious service: Not on file    Active member of club or organization: Not on file    Attends meetings of clubs or organizations: Not on file    Relationship status: Not on file  . Intimate partner violence:    Fear of current or ex partner: Not on file    Emotionally abused: Not on file    Physically abused: Not on file    Forced sexual activity: Not on file  Other Topics Concern  . Not on file  Social History Narrative  . Not on file   Review of Systems  Constitutional: Negative for fatigue, fever and unexpected weight change.  HENT: Positive for sore throat and trouble swallowing.   Respiratory: Negative for chest tightness and shortness of breath.   Cardiovascular: Negative for chest pain, palpitations and leg swelling.  Gastrointestinal: Negative for abdominal pain and Stephens in stool.  Neurological: Positive for dizziness.  Negative for syncope, light-headedness and headaches.  Psychiatric/Behavioral: Positive for sleep disturbance.       Objective:   Physical Exam  Constitutional: She is oriented to person, place, and time. She appears well-developed and well-nourished.  HENT:  Head: Normocephalic and atraumatic.  Left Ear: Tympanic membrane normal.  Mouth/Throat: Oropharynx is clear and moist and mucous membranes are normal. No oropharyngeal exudate, posterior oropharyngeal edema or posterior oropharyngeal erythema.  Left canal: moderate amount of cerumen, small appearance of TM that was normal  Right canal: large amount of cerumen, unable to visualize TM   Eyes: Pupils are equal, round, and reactive to light. Conjunctivae and EOM are normal. Right eye exhibits no nystagmus. Left eye exhibits no nystagmus.  Neck: Carotid bruit is not present.  No enlarged lymphadenopathy, but had slight tenderness over left [facility]  Cardiovascular: Normal rate, regular rhythm, normal heart sounds and intact distal pulses.  Pulmonary/Chest: Effort normal and breath sounds normal.  Abdominal: Soft. She exhibits no pulsatile midline mass. There is no tenderness.  Neurological: She is alert and oriented to person, place, and time. She has normal strength. She displays a negative Romberg sign.  No focal weakness, non focal neuro exam  Skin: Skin is warm and dry.  Psychiatric: She has a normal mood and affect. Her behavior is normal.  Vitals reviewed.   Vitals:   02/18/18 1003 02/18/18 1008  BP: (!) 157/80 (!) 150/62  Pulse: 68   Temp: 97.9 F (36.6 C)   TempSrc: Oral   SpO2: 96%   Weight: 155 lb 12.8 oz (70.7 kg)   Height: 5' (1.524 m)    Results for orders placed or performed in visit on 02/18/18  POCT rapid strep A  Result Value Ref Range   Rapid Strep A Screen Negative Negative       Assessment & Plan:  MODELLE VOLLMER is a 67 y.o. female Essential hypertension - Plan: Lipid panel, Comprehensive metabolic  panel, olmesartan (BENICAR) 20 MG tablet, amLODipine (NORVASC) 10 MG tablet  -Due to concerns on recall changed losartan to losartan, continue Norvasc, monitor  home readings.  Recheck in 1 month with home readings  Hyperlipidemia, unspecified hyperlipidemia type - Plan: Lipid panel, Comprehensive metabolic panel, atorvastatin (LIPITOR) 20 MG tablet  -Check labs, continue Lipitor same dose for now.  Sore throat - Plan: POCT rapid strep A, Culture, Group A Strep Acute upper respiratory infection  -Few days of symptoms as above, suspected viral respiratory infection.  Symptomatic care discussed with RTC precautions and handout given.  Insomnia, unspecified type  -Sleep hygiene discussed, avoidance of late afternoon caffeine and chocolate.  Trial of melatonin, handout given, return to clinic to discuss other treatment options if persistent  Episode of dizziness - Plan: CBC  - maintain hydration, check CBC,  orthostatic precautions discussed with Stephens pressure medication. RTC/ER precautions if symptoms persist.   Meds ordered this encounter  Medications  . olmesartan (BENICAR) 20 MG tablet    Sig: Take 1 tablet (20 mg total) by mouth daily.    Dispense:  90 tablet    Refill:  1  . amLODipine (NORVASC) 10 MG tablet    Sig: TAKE 1 TABLET BY MOUTH DAILY FOR Stephens PRESSURE    Dispense:  90 tablet    Refill:  0  . atorvastatin (LIPITOR) 20 MG tablet    Sig: Take 1 tablet (20 mg total) by mouth daily.    Dispense:  90 tablet    Refill:  0   Patient Instructions    Try melatonin, other recommendations for sleep below. Avoid any caffeine after lunch including chocolate.  If not improved in next few weeks - schedule an appointment to look at other treatment.  I changed the losartan to olmesartan.  Let me know if that is too costly.  Recheck in the next 1 month with home Stephens pressure readings.  Current sore throat is likely due to a virus.  See information below.Return to the clinic or go  to the nearest emergency room if any of your symptoms worsen or new symptoms occur.  Make sure to stay hydrated throughout the day.  When you first stand up, do so slowly and see if that helps with dizziness.  If you have any dizziness do not work from [employer].  If dizziness symptoms return, please follow-up to discuss the symptoms further.  Return to the clinic or go to the nearest emergency room if any of your symptoms worsen or new symptoms occur.   Sore Throat A sore throat is pain, burning, irritation, or scratchiness in the throat. When you have a sore throat, you may feel pain or tenderness in your throat when you swallow or talk. Many things can cause a sore throat, including:  An infection.  Seasonal allergies.  Dryness in the air.  Irritants, such as smoke or pollution.  Gastroesophageal reflux disease (GERD).  A tumor.  A sore throat is often the first sign of another sickness. It may happen with other symptoms, such as coughing, sneezing, fever, and swollen neck glands. Most sore throats go away without medical treatment. Follow these instructions at home:  Take over-the-counter medicines only as told by your health care provider.  Drink enough fluids to keep your urine clear or pale yellow.  Rest as needed.  To help with pain, try: ? Sipping warm liquids, such as broth, herbal tea, or warm water. ? Eating or drinking cold or frozen liquids, such as frozen ice pops. ? Gargling with a salt-water mixture 3-4 times a day or as needed. To make a salt-water mixture, completely dissolve -1 tsp  of salt in 1 cup of warm water. ? Sucking on hard candy or throat lozenges. ? Putting a cool-mist humidifier in your bedroom at night to moisten the air. ? Sitting in the bathroom with the door closed for 5-10 minutes while you run hot water in the shower.  Do not use any tobacco products, such as cigarettes, chewing tobacco, and e-cigarettes. If you need help quitting, ask your  health care provider. Contact a health care provider if:  You have a fever for more than 2-3 days.  You have symptoms that last (are persistent) for more than 2-3 days.  Your throat does not get better within 7 days.  You have a fever and your symptoms suddenly get worse. Get help right away if:  You have difficulty breathing.  You cannot swallow fluids, soft foods, or your saliva.  You have increased swelling in your throat or neck.  You have persistent nausea and vomiting. This information is not intended to replace advice given to you by your health care provider. Make sure you discuss any questions you have with your health care provider. Document Released: 04/26/2004 Document Revised: 11/13/2015 Document Reviewed: 01/07/2015 Elsevier Interactive Patient Education  2018 [place].  Upper Respiratory Infection, Adult Most upper respiratory infections (URIs) are caused by a virus. A URI affects the nose, throat, and upper air passages. The most common type of URI is often called "the common cold." Follow these instructions at home:  Take medicines only as told by your doctor.  Gargle warm saltwater or take cough drops to comfort your throat as told by your doctor.  Use a warm mist humidifier or inhale steam from a shower to increase air moisture. This may make it easier to breathe.  Drink enough fluid to keep your pee (urine) clear or pale yellow.  Eat soups and other clear broths.  Have a healthy diet.  Rest as needed.  Go back to work when your fever is gone or your doctor says it is okay. ? You may need to stay home longer to avoid giving your URI to others. ? You can also wear a face mask and wash your hands often to prevent spread of the virus.  Use your inhaler more if you have asthma.  Do not use any tobacco products, including cigarettes, chewing tobacco, or electronic cigarettes. If you need help quitting, ask your doctor. Contact a doctor if:  You are  getting worse, not better.  Your symptoms are not helped by medicine.  You have chills.  You are getting more short of breath.  You have brown or red mucus.  You have yellow or brown discharge from your nose.  You have pain in your face, especially when you bend forward.  You have a fever.  You have puffy (swollen) neck glands.  You have pain while swallowing.  You have white areas in the back of your throat. Get help right away if:  You have very bad or constant: ? Headache. ? Ear pain. ? Pain in your forehead, behind your eyes, and over your cheekbones (sinus pain). ? Chest pain.  You have long-lasting (chronic) lung disease and any of the following: ? Wheezing. ? Long-lasting cough. ? Coughing up Stephens. ? A change in your usual mucus.  You have a stiff neck.  You have changes in your: ? Vision. ? Hearing. ? Thinking. ? Mood. This information is not intended to replace advice given to you by your health care provider. Make sure  you discuss any questions you have with your health care provider. Document Released: 09/05/2007 Document Revised: 11/20/2015 Document Reviewed: 06/24/2013 Elsevier Interactive Patient Education  2018 [employer].    Insomnia Insomnia is a sleep disorder that makes it difficult to fall asleep or to stay asleep. Insomnia can cause tiredness (fatigue), low energy, difficulty concentrating, mood swings, and poor performance at work or school. There are three different ways to classify insomnia:  Difficulty falling asleep.  Difficulty staying asleep.  Waking up too early in the morning.  Any type of insomnia can be long-term (chronic) or short-term (acute). Both are common. Short-term insomnia usually lasts for three months or less. Chronic insomnia occurs at least three times a week for longer than three months. What are the causes? Insomnia may be caused by another condition, situation, or substance, such as:  Anxiety.  Certain  medicines.  Gastroesophageal reflux disease (GERD) or other gastrointestinal conditions.  Asthma or other breathing conditions.  Restless legs syndrome, sleep apnea, or other sleep disorders.  Chronic pain.  Menopause. This may include hot flashes.  Stroke.  Abuse of alcohol, tobacco, or illegal drugs.  Depression.  Caffeine.  Neurological disorders, such as Alzheimer disease.  An overactive thyroid (hyperthyroidism).  The cause of insomnia may not be known. What increases the risk? Risk factors for insomnia include:  Gender. Women are more commonly affected than men.  Age. Insomnia is more common as you get older.  Stress. This may involve your professional or personal life.  Income. Insomnia is more common in people with lower income.  Lack of exercise.  Irregular work schedule or night shifts.  Traveling between different time zones.  What are the signs or symptoms? If you have insomnia, trouble falling asleep or trouble staying asleep is the main symptom. This may lead to other symptoms, such as:  Feeling fatigued.  Feeling nervous about going to sleep.  Not feeling rested in the morning.  Having trouble concentrating.  Feeling irritable, anxious, or depressed.  How is this treated? Treatment for insomnia depends on the cause. If your insomnia is caused by an underlying condition, treatment will focus on addressing the condition. Treatment may also include:  Medicines to help you sleep.  Counseling or therapy.  Lifestyle adjustments.  Follow these instructions at home:  Take medicines only as directed by your health care provider.  Keep regular sleeping and waking hours. Avoid naps.  Keep a sleep diary to help you and your health care provider figure out what could be causing your insomnia. Include: ? When you sleep. ? When you wake up during the night. ? How well you sleep. ? How rested you feel the next day. ? Any side effects of  medicines you are taking. ? What you eat and drink.  Make your bedroom a comfortable place where it is easy to fall asleep: ? Put up shades or special blackout curtains to block light from outside. ? Use a white noise machine to block noise. ? Keep the temperature cool.  Exercise regularly as directed by your health care provider. Avoid exercising right before bedtime.  Use relaxation techniques to manage stress. Ask your health care provider to suggest some techniques that may work well for you. These may include: ? Breathing exercises. ? Routines to release muscle tension. ? Visualizing peaceful scenes.  Cut back on alcohol, caffeinated beverages, and cigarettes, especially close to bedtime. These can disrupt your sleep.  Do not overeat or eat spicy foods right before bedtime.  This can lead to digestive discomfort that can make it hard for you to sleep.  Limit screen use before bedtime. This includes: ? Watching TV. ? Using your smartphone, tablet, and computer.  Stick to a routine. This can help you fall asleep faster. Try to do a quiet activity, brush your teeth, and go to bed at the same time each night.  Get out of bed if you are still awake after 15 minutes of trying to sleep. Keep the lights down, but try reading or doing a quiet activity. When you feel sleepy, go back to bed.  Make sure that you drive carefully. Avoid driving if you feel very sleepy.  Keep all follow-up appointments as directed by your health care provider. This is important. Contact a health care provider if:  You are tired throughout the day or have trouble in your daily routine due to sleepiness.  You continue to have sleep problems or your sleep problems get worse. Get help right away if:  You have serious thoughts about hurting yourself or someone else. This information is not intended to replace advice given to you by your health care provider. Make sure you discuss any questions you have with  your health care provider. Document Released: 03/16/2000 Document Revised: 08/19/2015 Document Reviewed: 12/18/2013 Elsevier Interactive Patient Education  2018 [employer].  Dizziness Dizziness is a common problem. It is a feeling of unsteadiness or light-headedness. You may feel like you are about to faint. Dizziness can lead to injury if you stumble or fall. Anyone can become dizzy, but dizziness is more common in older adults. This condition can be caused by a number of things, including medicines, dehydration, or illness. Follow these instructions at home: Eating and drinking  Drink enough fluid to keep your urine clear or pale yellow. This helps to keep you from becoming dehydrated. Try to drink more clear fluids, such as water.  Do not drink alcohol.  Limit your caffeine intake if told to do so by your health care provider. Check ingredients and nutrition facts to see if a food or beverage contains caffeine.  Limit your salt (sodium) intake if told to do so by your health care provider. Check ingredients and nutrition facts to see if a food or beverage contains sodium. Activity  Avoid making quick movements. ? Rise slowly from chairs and steady yourself until you feel okay. ? In the morning, first sit up on the side of the bed. When you feel okay, stand slowly while you hold onto something until you know that your balance is fine.  If you need to stand in one place for a long time, move your legs often. Tighten and relax the muscles in your legs while you are standing.  Do not drive or use heavy machinery if you feel dizzy.  Avoid bending down if you feel dizzy. Place items in your home so that they are easy for you to reach without leaning over. Lifestyle  Do not use any products that contain nicotine or tobacco, such as cigarettes and e-cigarettes. If you need help quitting, ask your health care provider.  Try to reduce your stress level by using methods such as yoga or  meditation. Talk with your health care provider if you need help to manage your stress. General instructions  Watch your dizziness for any changes.  Take over-the-counter and prescription medicines only as told by your health care provider. Talk with your health care provider if you think that your dizziness is  caused by a medicine that you are taking.  Tell a friend or a family member that you are feeling dizzy. If he or she notices any changes in your behavior, have this person call your health care provider.  Keep all follow-up visits as told by your health care provider. This is important. Contact a health care provider if:  Your dizziness does not go away.  Your dizziness or light-headedness gets worse.  You feel nauseous.  You have reduced hearing.  You have new symptoms.  You are unsteady on your feet or you feel like the room is spinning. Get help right away if:  You vomit or have diarrhea and are unable to eat or drink anything.  You have problems talking, walking, swallowing, or using your arms, hands, or legs.  You feel generally weak.  You are not thinking clearly or you have trouble forming sentences. It may take a friend or family member to notice this.  You have chest pain, abdominal pain, shortness of breath, or sweating.  Your vision changes.  You have any bleeding.  You have a severe headache.  You have neck pain or a stiff neck.  You have a fever. These symptoms may represent a serious problem that is an emergency. Do not wait to see if the symptoms will go away. Get medical help right away. Call your local emergency services (911 in the U.S.). Do not drive yourself to the hospital. Summary  Dizziness is a feeling of unsteadiness or light-headedness. This condition can be caused by a number of things, including medicines, dehydration, or illness.  Anyone can become dizzy, but dizziness is more common in older adults.  Drink enough fluid to keep  your urine clear or pale yellow. Do not drink alcohol.  Avoid making quick movements if you feel dizzy. Monitor your dizziness for any changes. This information is not intended to replace advice given to you by your health care provider. Make sure you discuss any questions you have with your health care provider. Document Released: 09/12/2000 Document Revised: 04/21/2016 Document Reviewed: 04/21/2016 Elsevier Interactive Patient Education  [employer].    If you have lab work done today you will be contacted with your lab results within the next 2 weeks.  If you have not heard from [place] then please contact us. The fastest way to get your results is to register for My Chart.   IF you received an x-Stephens today, you will receive an invoice from [facility]. Please contact [facility] at [phone] with questions or concerns regarding your invoice.   IF you received labwork today, you will receive an invoice from [place]. Please contact [facility] at [phone] with questions or concerns regarding your invoice.   Our billing staff will not be able to assist you with questions regarding bills from these companies.  You will be contacted with the lab results as soon as they are available. The fastest way to get your results is to activate your My Chart account. Instructions are located on the last page of this paperwork. If you have not heard from [place] regarding the results in 2 weeks, please contact this office.       I personally performed the services described in this documentation, which was scribed in my presence. The recorded information has been reviewed and considered for accuracy and completeness, addended by me as needed, and agree with information above.  Signed,   Jessica Ray, MD [facility] [place].  02/21/18 7:01 PM

## 2018-02-19 LAB — COMPREHENSIVE METABOLIC PANEL
ALK PHOS: 132 IU/L — AB (ref 39–117)
ALT: 26 IU/L (ref 0–32)
AST: 32 IU/L (ref 0–40)
Albumin/Globulin Ratio: 1.9 (ref 1.2–2.2)
Albumin: 4.9 g/dL — ABNORMAL HIGH (ref 3.6–4.8)
BUN/Creatinine Ratio: 19 (ref 12–28)
BUN: 12 mg/dL (ref 8–27)
Bilirubin Total: 0.7 mg/dL (ref 0.0–1.2)
CO2: 23 mmol/L (ref 20–29)
CREATININE: 0.64 mg/dL (ref 0.57–1.00)
Calcium: 9.9 mg/dL (ref 8.7–10.3)
Chloride: 102 mmol/L (ref 96–106)
GFR calc Af Amer: 108 mL/min/{1.73_m2} (ref >59)
GFR calc non Af Amer: 94 mL/min/{1.73_m2} (ref >59)
GLUCOSE: 87 mg/dL (ref 65–99)
Globulin, Total: 2.6 g/dL (ref 1.5–4.5)
Potassium: 3.7 mmol/L (ref 3.5–5.2)
Sodium: 142 mmol/L (ref 134–144)
Total Protein: 7.5 g/dL (ref 6.0–8.5)

## 2018-02-19 LAB — CBC
HEMATOCRIT: 44.1 % (ref 34.0–46.6)
HEMOGLOBIN: 15.2 g/dL (ref 11.1–15.9)
MCH: 30.2 pg (ref 26.6–33.0)
MCHC: 34.5 g/dL (ref 31.5–35.7)
MCV: 88 fL (ref 79–97)
Platelets: 318 10*3/uL (ref 150–450)
RBC: 5.04 x10E6/uL (ref 3.77–5.28)
RDW: 12.5 % (ref 12.3–15.4)
WBC: 6.3 10*3/uL (ref 3.4–10.8)

## 2018-02-19 LAB — LIPID PANEL
Chol/HDL Ratio: 2.5 ratio (ref 0.0–4.4)
Cholesterol, Total: 168 mg/dL (ref 100–199)
HDL: 66 mg/dL (ref >39)
LDL CALC: 87 mg/dL (ref 0–99)
Triglycerides: 73 mg/dL (ref 0–149)
VLDL Cholesterol Cal: 15 mg/dL (ref 5–40)

## 2018-02-20 ENCOUNTER — Other Ambulatory Visit: Payer: Self-pay | Admitting: Family Medicine

## 2018-02-20 DIAGNOSIS — I10 Essential (primary) hypertension: Secondary | ICD-10-CM

## 2018-02-20 NOTE — Telephone Encounter (Signed)
Dr. Carlota Raspberry discontinued this medication.

## 2018-02-21 ENCOUNTER — Encounter (HOSPITAL_COMMUNITY): Payer: Self-pay | Admitting: Family Medicine

## 2018-02-21 ENCOUNTER — Ambulatory Visit (HOSPITAL_COMMUNITY)
Admission: EM | Admit: 2018-02-21 | Discharge: 2018-02-21 | Disposition: A | Discharge status: Home / Self Care | Payer: PPO | Attending: Family Medicine | Admitting: Family Medicine

## 2018-02-21 ENCOUNTER — Telehealth: Payer: Self-pay | Admitting: Family Medicine

## 2018-02-21 DIAGNOSIS — H6123 Impacted cerumen, bilateral: Secondary | ICD-10-CM

## 2018-02-21 DIAGNOSIS — J40 Bronchitis, not specified as acute or chronic: Secondary | ICD-10-CM

## 2018-02-21 LAB — CULTURE, GROUP A STREP: STREP A CULTURE: NEGATIVE

## 2018-02-21 MED ORDER — HYDROCODONE-HOMATROPINE 5-1.5 MG/5ML PO SYRP: 5.0000 mL | ORAL_SOLUTION | Freq: Four times a day (QID) | ORAL | 0 refills | Status: DC | PRN

## 2018-02-21 MED ORDER — AMOXICILLIN 875 MG PO TABS: 875.0000 mg | ORAL_TABLET | Freq: Two times a day (BID) | ORAL | 0 refills | Status: DC

## 2018-02-21 NOTE — Telephone Encounter (Signed)
Copied from [place] [id number]. Topic: Quick Communication - See Telephone Encounter >> Feb 21, 2018  8:40 AM Conception Chancy, NT wrote: CRM for notification. See Telephone encounter for: 02/21/18.  Patient is calling and was seen on 02/18/18 and states she was told she had a cold. She states she is still not better and she usually gets bronchitis and she also has to work. She states she has been coughing all night with a runny nose. She would like to know can Dr. Carlota Raspberry please call her in an antibiotic.  [id number] [address] [id number]: [phone]: [phone]

## 2018-02-21 NOTE — Telephone Encounter (Signed)
Patient calling and would like to know the status of getting medication sent to the pharmacy. Please advise.

## 2018-02-21 NOTE — Telephone Encounter (Signed)
Seen at urgent care tonight.

## 2018-02-21 NOTE — ED Triage Notes (Signed)
Pt presents with generalized body aches, persistent cough, sore throat, and headache.

## 2018-02-21 NOTE — ED Provider Notes (Addendum)
[place]    CSN: [id number] Arrival date & time: 02/21/18  1513     History   Chief Complaint Chief Complaint  Patient presents with  . Cough  . Sore Throat    HPI Jessica Stephens is a 67 y.o. female.   This is an initial visit at the [facility] urgent care for this 67 year old woman complaining of cough and sore throat.  She developed symptoms 3 days ago, the same day she went for refill on her hypertension medicine.  She complained about the sore throat and was told that it was just an upper respiratory infection and to gargle with salt water.  Patient is been gargling all week with salt water and irrigate the pain is gotten worse.  She called her doctor 3 times a day and they said they could not help her and that she would have to go to the emergency room.  Patient works in [employer] (medical) and is loathe to miss work at this busy as time of the year.  She had great difficulty sleeping last night.  She supposed to work this weekend.  Patient also complains of gradual hearing loss of the last 2 months.  She saw Dr. for that and was told she had an ear canal infection.  She has been using the eardrops provided for over a month and there is been no improvement in her hearing.     Past Medical History:  Diagnosis Date  . Abnormal transaminases   . Anxiety   . Arthritis   . Esophageal stricture   . GERD (gastroesophageal reflux disease)   . Hiatal hernia   . Hyperlipidemia   . Hypertension   . Maxillary sinusitis   . Proctitis   . Ulcerative colitis ([place])    left sided  . Vitamin B12 deficiency     Patient Active Problem List   Diagnosis Date Noted  . Lower urinary tract symptoms (LUTS) 12/05/2016  . Acute UTI 12/05/2016  . Flank pain, acute 12/05/2016  . Prolonged Q-T interval on ECG 08/21/2014  . Lobar pneumonia due to unspecified organism 08/21/2014  . CAP (community acquired pneumonia) 08/19/2014  . Community acquired pneumonia 08/19/2014  . Sepsis ([place])  08/19/2014  . Hypokalemia 08/19/2014  . HYPERLIPIDEMIA 05/29/2010  . DYSPNEA 05/29/2010  . CHEST PAIN, ATYPICAL 05/02/2010  . ESOPHAGEAL STRICTURE 04/07/2010  . HIATAL HERNIA 04/07/2010  . ULCERATIVE PROCTITIS 04/07/2010  . ARTHRITIS 04/07/2010  . LUQ PAIN 04/07/2010  . TRANSAMINASES, SERUM, ELEVATED 04/07/2010  . VITAMIN B12 DEFICIENCY 01/30/2008  . ANXIETY, CHRONIC 01/29/2008  . Essential hypertension 01/29/2008  . CHRONIC MAXILLARY SINUSITIS 04/24/2007  . G E R D 04/24/2007  . UNSPECIFIED OSTEOPOROSIS 04/24/2007  . ULCERATIVE COLITIS, LEFT SIDED 02/25/2007    Past Surgical History:  Procedure Laterality Date  . CESAREAN SECTION    . nose surg    . NOSE SURGERY      OB History   None      Home Medications    Prior to Admission medications   Medication Sig Start Date End Date Taking? Authorizing Provider  amLODipine (NORVASC) 10 MG tablet TAKE 1 TABLET BY MOUTH DAILY FOR BLOOD PRESSURE 02/18/18   [name], MD  amoxicillin (AMOXIL) 875 MG tablet Take 1 tablet (875 mg total) by mouth 2 (two) times daily. 02/21/18   Robyn Haber, MD  atorvastatin (LIPITOR) 20 MG tablet Take 1 tablet (20 mg total) by mouth daily. 02/18/18   [name], MD  cetirizine (ZYRTEC ALLERGY) 10 MG tablet Take 1 tablet (10 mg total) by mouth daily. 01/27/14   Robyn Haber, MD  esomeprazole (NEXIUM) 20 MG capsule Take 20 mg by mouth daily as needed (indigestion).     [provider]  fluticasone (FLONASE) 50 MCG/ACT nasal spray Place 2 sprays into both nostrils daily. 01/14/18   [name], MD  HYDROcodone-homatropine (HYDROMET) 5-1.5 MG/5ML syrup Take 5 mLs by mouth every 6 (six) hours as needed for cough. 02/21/18   Robyn Haber, MD  Mesalamine (LIALDA PO) Take by mouth.    [provider]  Multiple Vitamins-Minerals (VISION VITAMINS) TABS Take 1 tablet by mouth daily.    [provider]  neomycin-polymyxin-hydrocortisone (CORTISPORIN)  3.5-10000-1 OTIC suspension Place 3 drops into the left ear 3 (three) times daily. 01/14/18   [name], MD  olmesartan (BENICAR) 20 MG tablet Take 1 tablet (20 mg total) by mouth daily. 02/18/18   [name], MD    Family History Family History  Problem Relation Age of Onset  . Arthritis Mother   . Dementia Mother   . Diabetes Father   . Hyperlipidemia Father   . Hypertension Father     Social History Social History   Tobacco Use  . Smoking status: Never Smoker  . Smokeless tobacco: Never Used  Substance Use Topics  . Alcohol use: No  . Drug use: No     Allergies   Alendronate sodium and Sulfonamide derivatives   Review of Systems Review of Systems   Physical Exam Triage Vital Signs ED Triage Vitals  Enc Vitals Group     BP      Pulse      Resp      Temp      Temp src      SpO2      Weight      Height      Head Circumference      Peak Flow      Pain Score      Pain Loc      Pain Edu?      Excl. in [place]?    No data found.  Updated Vital Signs BP (!) 164/99 (BP Location: Right Arm)   Pulse 81   Temp 97.9 F (36.6 C) (Oral)   Resp 18   SpO2 97%    Physical Exam  Constitutional: She is oriented to person, place, and time. She appears well-developed and well-nourished.  HENT:  Head: Normocephalic and atraumatic.  Mouth/Throat: Uvula is midline, oropharynx is clear and moist and mucous membranes are normal.  Cerumen impaction--> lavaged clear bilaterally  Eyes: Pupils are equal, round, and reactive to light. EOM are normal.  Neck: Normal range of motion. Neck supple.  Cardiovascular: Normal rate and regular rhythm.  Pulmonary/Chest: Effort normal. She has rhonchi.  Abdominal: Soft.  Neurological: She is alert and oriented to person, place, and time.  Skin: Skin is warm.  Psychiatric: She has a normal mood and affect. Her behavior is normal.  Nursing note and vitals reviewed.    UC Treatments / Results  Labs (all labs ordered  are listed, but only abnormal results are displayed) Labs Reviewed - No data to display  EKG None  Radiology No results found.  Procedures Procedures (including critical care time)  Medications Ordered in UC Medications - No data to display  Initial Impression / Assessment and Plan / UC Course  I have reviewed the triage vital signs and the  nursing notes.  Pertinent labs & imaging results that were available during my care of the patient were reviewed by me and considered in my medical decision making (see chart for details).    Final Clinical Impressions(s) / UC Diagnoses   Final diagnoses:  Bronchitis  Bilateral impacted cerumen   Discharge Instructions   None    ED Prescriptions    Medication Sig Dispense Auth. Provider   amoxicillin (AMOXIL) 875 MG tablet Take 1 tablet (875 mg total) by mouth 2 (two) times daily. 20 tablet Robyn Haber, MD   HYDROcodone-homatropine (HYDROMET) 5-1.5 MG/5ML syrup Take 5 mLs by mouth every 6 (six) hours as needed for cough. 60 mL Robyn Haber, MD     Controlled Substance Prescriptions [place] Controlled Substance Registry consulted? Not Applicable   Robyn Haber, MD 02/21/18 1647    Robyn Haber, MD 02/21/18 1648    Robyn Haber, MD 02/21/18 1715

## 2018-02-21 NOTE — Telephone Encounter (Signed)
I have spoken to the Jessica Stephens as she has call a few more time. She stated that she self diagnosis herself with bronchitis and wanted the provider to send in some antibiotics. I stated that she will have to be seen because she will have to be evaluated. I have given her the options of either going to the ED or making another appointment to be seen. I then routed her to the front desk to see if an appointment can be made.   Thanks, Zettie Pho to provider.

## 2018-02-24 ENCOUNTER — Encounter: Payer: Self-pay | Admitting: *Deleted

## 2018-05-06 ENCOUNTER — Other Ambulatory Visit: Payer: Self-pay

## 2018-05-06 NOTE — Patient Outreach (Signed)
[facility]) Care Management  05/06/2018  Jessica Stephens 1950-04-10 [id number]   Medication Adherence call to [name] left a message for patient to call back.   [facility] Management Direct Dial [phone]  Fax [phone] [name]

## 2018-05-07 ENCOUNTER — Ambulatory Visit (HOSPITAL_COMMUNITY)
Admission: EM | Admit: 2018-05-07 | Discharge: 2018-05-07 | Disposition: A | Discharge status: Home / Self Care | Payer: PPO | Attending: Emergency Medicine | Admitting: Emergency Medicine

## 2018-05-07 ENCOUNTER — Encounter (HOSPITAL_COMMUNITY): Payer: Self-pay | Admitting: Emergency Medicine

## 2018-05-07 DIAGNOSIS — R109 Unspecified abdominal pain: Secondary | ICD-10-CM | POA: Diagnosis not present

## 2018-05-07 LAB — POCT URINALYSIS DIP (DEVICE)
Bilirubin Urine: NEGATIVE
Glucose, UA: NEGATIVE mg/dL
Ketones, ur: NEGATIVE mg/dL
Leukocytes, UA: NEGATIVE
Nitrite: NEGATIVE
Protein, ur: NEGATIVE mg/dL
Specific Gravity, Urine: 1.01 (ref 1.005–1.030)
Urobilinogen, UA: 0.2 mg/dL (ref 0.0–1.0)
pH: 5.5 (ref 5.0–8.0)

## 2018-05-07 MED ORDER — MELOXICAM 7.5 MG PO TABS: 7.5000 mg | ORAL_TABLET | Freq: Every day | ORAL | 0 refills | Status: DC

## 2018-05-07 MED ORDER — KETOROLAC TROMETHAMINE 30 MG/ML IJ SOLN
INTRAMUSCULAR | Status: AC
  Filled 2018-05-07: qty 1

## 2018-05-07 MED ORDER — KETOROLAC TROMETHAMINE 30 MG/ML IJ SOLN
30.0000 mg | Freq: Once | INTRAMUSCULAR | Status: AC
  Administered 2018-05-07: 30 mg via INTRAMUSCULAR

## 2018-05-07 NOTE — Discharge Instructions (Signed)
Urine negative for infection.  Does show slight blood that can indicate a kidney stone.  However, given your history and exam, more likely due to muscle pain. Start Mobic. Do not take ibuprofen (motrin/advil)/ naproxen (aleve) while on mobic.  Warm compress, rest. Keep hydrated, urine should be clear to pale yellow in color.  Monitor for worsening symptoms, urinary symptoms such as frequency, burning, fever, nausea/vomiting, follow-up for reevaluation.  If experiencing numbness, tingling of the inner thighs, loss of bladder or bowel control, go to the emergency department for further evaluation.

## 2018-05-07 NOTE — ED Provider Notes (Signed)
[place]    CSN: [id number] Arrival date & time: 05/07/18  0630     History   Chief Complaint Chief Complaint  Patient presents with  . Flank Pain    HPI Jessica Stephens is a 68 y.o. female.   68 year old female comes in for left flank pain for the past 2 days. States that pain is intermittent, worse with movement, can radiate to lower back. She denies injury/trauma. Had trouble getting into a comfortable position at rest last night. She has had history of frequent UTIs, but denies urinary symptoms such as frequency, dysuria, hematuria. She denies fever, chills, night sweats. Denies abdominal pain, nausea, vomiting. She has not tried anything for the symptoms.      Past Medical History:  Diagnosis Date  . Abnormal transaminases   . Anxiety   . Arthritis   . Esophageal stricture   . GERD (gastroesophageal reflux disease)   . Hiatal hernia   . Hyperlipidemia   . Hypertension   . Maxillary sinusitis   . Proctitis   . Ulcerative colitis ([place])    left sided  . Vitamin B12 deficiency     Patient Active Problem List   Diagnosis Date Noted  . Lower urinary tract symptoms (LUTS) 12/05/2016  . Acute UTI 12/05/2016  . Flank pain, acute 12/05/2016  . Prolonged Q-T interval on ECG 08/21/2014  . Lobar pneumonia due to unspecified organism 08/21/2014  . CAP (community acquired pneumonia) 08/19/2014  . Community acquired pneumonia 08/19/2014  . Sepsis ([place]) 08/19/2014  . Hypokalemia 08/19/2014  . HYPERLIPIDEMIA 05/29/2010  . DYSPNEA 05/29/2010  . CHEST PAIN, ATYPICAL 05/02/2010  . ESOPHAGEAL STRICTURE 04/07/2010  . HIATAL HERNIA 04/07/2010  . ULCERATIVE PROCTITIS 04/07/2010  . ARTHRITIS 04/07/2010  . LUQ PAIN 04/07/2010  . TRANSAMINASES, SERUM, ELEVATED 04/07/2010  . VITAMIN B12 DEFICIENCY 01/30/2008  . ANXIETY, CHRONIC 01/29/2008  . Essential hypertension 01/29/2008  . CHRONIC MAXILLARY SINUSITIS 04/24/2007  . G E R D 04/24/2007  . UNSPECIFIED  OSTEOPOROSIS 04/24/2007  . ULCERATIVE COLITIS, LEFT SIDED 02/25/2007    Past Surgical History:  Procedure Laterality Date  . CESAREAN SECTION    . nose surg    . NOSE SURGERY      OB History   No obstetric history on file.      Home Medications    Prior to Admission medications   Medication Sig Start Date End Date Taking? Authorizing Provider  amLODipine (NORVASC) 10 MG tablet TAKE 1 TABLET BY MOUTH DAILY FOR BLOOD PRESSURE 02/18/18  Yes [name], MD  atorvastatin (LIPITOR) 20 MG tablet Take 1 tablet (20 mg total) by mouth daily. 02/18/18  Yes [name], MD  cetirizine (ZYRTEC ALLERGY) 10 MG tablet Take 1 tablet (10 mg total) by mouth daily. 01/27/14  Yes Robyn Haber, MD  Mesalamine (LIALDA PO) Take by mouth.   Yes [provider]  Multiple Vitamins-Minerals (VISION VITAMINS) TABS Take 1 tablet by mouth daily.   Yes [provider]  esomeprazole (NEXIUM) 20 MG capsule Take 20 mg by mouth daily as needed (indigestion).     [provider]  fluticasone (FLONASE) 50 MCG/ACT nasal spray Place 2 sprays into both nostrils daily. 01/14/18   [name], MD  meloxicam (MOBIC) 7.5 MG tablet Take 1 tablet (7.5 mg total) by mouth daily. 05/07/18   Tasia Catchings, Jesson Foskey V, PA-C  neomycin-polymyxin-hydrocortisone (CORTISPORIN) 3.5-10000-1 OTIC suspension Place 3 drops into the left ear 3 (three) times daily. 01/14/18  [name], MD  olmesartan (BENICAR) 20 MG tablet Take 1 tablet (20 mg total) by mouth daily. 02/18/18   [name], MD    Family History Family History  Problem Relation Age of Onset  . Arthritis Mother   . Dementia Mother   . Diabetes Father   . Hyperlipidemia Father   . Hypertension Father     Social History Social History   Tobacco Use  . Smoking status: Never Smoker  . Smokeless tobacco: Never Used  Substance Use Topics  . Alcohol use: No  . Drug use: No     Allergies   Alendronate sodium and  Sulfonamide derivatives   Review of Systems Review of Systems  Reason unable to perform ROS: See HPI as above.     Physical Exam Triage Vital Signs ED Triage Vitals  Enc Vitals Group     BP --      Pulse Rate 05/07/18 1955 97     Resp 05/07/18 1955 18     Temp 05/07/18 1955 98.1 F (36.7 C)     Temp Source 05/07/18 1955 Oral     SpO2 05/07/18 1955 100 %     Weight --      Height --      Head Circumference --      Peak Flow --      Pain Score 05/07/18 1956 8     Pain Loc --      Pain Edu? --      Excl. in [place]? --    No data found.  Updated Vital Signs Pulse 97   Temp 98.1 F (36.7 C) (Oral)   Resp 18   SpO2 100%   Visual Acuity Right Eye Distance:   Left Eye Distance:   Bilateral Distance:    Right Eye Near:   Left Eye Near:    Bilateral Near:     Physical Exam Constitutional:      General: She is not in acute distress.    Appearance: She is well-developed. She is not diaphoretic.  HENT:     Head: Normocephalic and atraumatic.  Eyes:     Conjunctiva/sclera: Conjunctivae normal.     Pupils: Pupils are equal, round, and reactive to light.  Cardiovascular:     Rate and Rhythm: Normal rate and regular rhythm.     Heart sounds: Normal heart sounds. No murmur. No friction rub. No gallop.   Pulmonary:     Effort: Pulmonary effort is normal. No accessory muscle usage or respiratory distress.     Breath sounds: Normal breath sounds. No stridor. No decreased breath sounds, wheezing, rhonchi or rales.  Abdominal:     General: Bowel sounds are normal.     Palpations: Abdomen is soft.     Tenderness: There is no abdominal tenderness. There is no guarding or rebound.  Musculoskeletal:     Comments: No tenderness on palpation of the spinous processes. Tenderness to palpation along left upper and lower back. Full ROM of back.  Skin:    General: Skin is warm and dry.  Neurological:     Mental Status: She is alert and oriented to person, place, and time.       UC Treatments / Results  Labs (all labs ordered are listed, but only abnormal results are displayed) Labs Reviewed  POCT URINALYSIS DIP (DEVICE) - Abnormal; Notable for the following components:      Result Value   Hgb urine dipstick TRACE (*)  All other components within normal limits    EKG None  Radiology No results found.  Procedures Procedures (including critical care time)  Medications Ordered in UC Medications  ketorolac (TORADOL) 30 MG/ML injection 30 mg (30 mg Intramuscular Given 05/07/18 2042)    Initial Impression / Assessment and Plan / UC Course  I have reviewed the triage vital signs and the nursing notes.  Pertinent labs & imaging results that were available during my care of the patient were reviewed by me and considered in my medical decision making (see chart for details).    Discussed urine dipstick results with patient. Discussed cannot rule out kidney stones. However, history and exam is more consistent with MSK pain. Toradol injection in office today. Other symptomatic treatment discussed. Push fluids. Return precautions given.  Final Clinical Impressions(s) / UC Diagnoses   Final diagnoses:  Flank pain   ED Prescriptions    Medication Sig Dispense Auth. Provider   meloxicam (MOBIC) 7.5 MG tablet Take 1 tablet (7.5 mg total) by mouth daily. 15 tablet Tobin Chad, [place] 05/07/18 2359

## 2018-05-07 NOTE — ED Triage Notes (Addendum)
Pt presents to [facility] for assessment of left flank pain since last night with worsening today.  Denies changes in urination.  Denies known injury, but states she was doing heavy lifting and a lot of cleaning this past week.  States she also gets urinary infections often.

## 2018-06-03 ENCOUNTER — Other Ambulatory Visit: Payer: Self-pay | Admitting: Family Medicine

## 2018-06-03 DIAGNOSIS — E785 Hyperlipidemia, unspecified: Secondary | ICD-10-CM

## 2018-06-03 DIAGNOSIS — I10 Essential (primary) hypertension: Secondary | ICD-10-CM

## 2018-06-03 NOTE — Telephone Encounter (Signed)
Requested Prescriptions  Pending Prescriptions Disp Refills  . amLODipine (NORVASC) 10 MG tablet [Pharmacy Med Name: AMLODIPINE BESYLATE 10 MG TAB] 90 tablet 0    Sig: TAKE 1 TABLET BY MOUTH EVERY DAY FOR BLOOD PRESSURE     Cardiovascular:  Calcium Channel Blockers Failed - 06/03/2018  3:09 PM      Failed - Last BP in normal range    BP Readings from Last 1 Encounters:  02/21/18 (!) 164/99         Passed - Valid encounter within last 6 months    Recent Outpatient Visits          3 months ago Essential hypertension   [facility], Ranell Patrick, MD   4 months ago Nasal congestion   [facility], Ranell Patrick, MD   1 year ago Cellulitis of left thumb   [facility], Ines Bloomer, MD   1 year ago Hyperlipidemia, unspecified hyperlipidemia type   [facility], Ranell Patrick, MD   1 year ago Annual physical exam   [facility], Ranell Patrick, MD           . atorvastatin (LIPITOR) 20 MG tablet [Pharmacy Med Name: ATORVASTATIN 20 MG TABLET] 90 tablet 0    Sig: TAKE 1 TABLET BY MOUTH EVERY DAY     Cardiovascular:  Antilipid - Statins Passed - 06/03/2018  3:09 PM      Passed - Total Cholesterol in normal range and within 360 days    Cholesterol, Total  Date Value Ref Range Status  02/18/2018 168 100 - 199 mg/dL Final         Passed - LDL in normal range and within 360 days    LDL Calculated  Date Value Ref Range Status  02/18/2018 87 0 - 99 mg/dL Final         Passed - HDL in normal range and within 360 days    HDL  Date Value Ref Range Status  02/18/2018 66 >39 mg/dL Final         Passed - Triglycerides in normal range and within 360 days    Triglycerides  Date Value Ref Range Status  02/18/2018 73 0 - 149 mg/dL Final         Passed - Patient is not pregnant      Passed - Valid encounter within last 12 months    Recent Outpatient Visits          3 months ago Essential hypertension   [facility], Ranell Patrick, MD   4 months ago Nasal congestion   [facility], Ranell Patrick, MD   1 year ago Cellulitis of left thumb   [facility] at [facility], Ines Bloomer, MD   1 year ago Hyperlipidemia, unspecified hyperlipidemia type   [facility], Ranell Patrick, MD   1 year ago Annual physical exam   [facility], Ranell Patrick, MD

## 2018-06-25 ENCOUNTER — Telehealth: Payer: Self-pay | Admitting: *Deleted

## 2018-06-25 ENCOUNTER — Encounter: Payer: Self-pay | Admitting: *Deleted

## 2018-06-25 ENCOUNTER — Other Ambulatory Visit: Payer: Self-pay

## 2018-06-25 DIAGNOSIS — R300 Dysuria: Secondary | ICD-10-CM

## 2018-06-25 NOTE — Telephone Encounter (Signed)
Schedule AWV (telmed visit)

## 2018-06-26 ENCOUNTER — Ambulatory Visit (INDEPENDENT_AMBULATORY_CARE_PROVIDER_SITE_OTHER): Payer: PPO | Admitting: Family Medicine

## 2018-06-26 ENCOUNTER — Other Ambulatory Visit: Payer: Self-pay

## 2018-06-26 DIAGNOSIS — R300 Dysuria: Secondary | ICD-10-CM

## 2018-06-26 LAB — POCT URINALYSIS DIP (MANUAL ENTRY)
Bilirubin, UA: NEGATIVE
GLUCOSE UA: NEGATIVE mg/dL
Ketones, POC UA: NEGATIVE mg/dL
Nitrite, UA: NEGATIVE
Protein Ur, POC: NEGATIVE mg/dL
SPEC GRAV UA: 1.015 (ref 1.010–1.025)
Urobilinogen, UA: 0.2 E.U./dL
pH, UA: 6 (ref 5.0–8.0)

## 2018-06-26 LAB — POC MICROSCOPIC URINALYSIS (UMFC): Mucus: ABSENT

## 2018-06-27 ENCOUNTER — Telehealth: Payer: Self-pay | Admitting: Family Medicine

## 2018-06-27 NOTE — Telephone Encounter (Signed)
Copied from [place] [id number]. Topic: General - Other >> Jun 26, 2018  4:57 PM Wynetta Emery, [place] C wrote: Reason for CRM:pt called in to be advised of her lab results. Pt says that she was told earlier today by staff that she would receive a call back  today at 3p. Pt says that she has been waiting all day for someone to call and is getting concerned. Pt also provided an alternate number to be reached at. (updated in [place])   Pt would like to be advised further.   Thanks.

## 2018-06-29 LAB — URINE CULTURE

## 2018-06-30 ENCOUNTER — Ambulatory Visit: Payer: PPO

## 2018-06-30 NOTE — Telephone Encounter (Signed)
Pt called once again for an update on the lab results. Cb# [phone]

## 2018-06-30 NOTE — Telephone Encounter (Signed)
Patient wants results on urine and urine cx. No msg on results thought I would check with you first to see if there is anything you would like for me to tell the patient

## 2018-07-01 ENCOUNTER — Telehealth: Payer: Self-pay | Admitting: Emergency Medicine

## 2018-07-01 ENCOUNTER — Other Ambulatory Visit: Payer: Self-pay

## 2018-07-01 DIAGNOSIS — R300 Dysuria: Secondary | ICD-10-CM

## 2018-07-01 MED ORDER — CLOTRIMAZOLE 1 % EX CREA: 1.0000 "application " | TOPICAL_CREAM | Freq: Two times a day (BID) | CUTANEOUS | 0 refills | Status: DC

## 2018-07-01 MED ORDER — NITROFURANTOIN MONOHYD MACRO 100 MG PO CAPS: 100.0000 mg | ORAL_CAPSULE | Freq: Two times a day (BID) | ORAL | 0 refills | Status: DC

## 2018-07-01 NOTE — Telephone Encounter (Signed)
Patient calling again for lab results. She is requesting a call back today, if possible.

## 2018-07-01 NOTE — Telephone Encounter (Signed)
Patient called into the office today to see what her results was from her urine sample. [place] called the patient an left her a meg but I told her that rx for uti was sent to the pharm along with some cream for the irritation. Patient was also stating was having problem with bladder dropping and was informed by [place] that she would need a office visit for the bladder drop. Patient stated she would try the meds sent into the pharm and would give a call to schedule office visit for the bladder if no better

## 2018-07-01 NOTE — Telephone Encounter (Signed)
Spoke with pt about concerns, she verbalized understanding.

## 2018-08-09 NOTE — Progress Notes (Signed)
error 

## 2018-10-01 ENCOUNTER — Other Ambulatory Visit: Payer: Self-pay | Admitting: Family Medicine

## 2018-10-01 DIAGNOSIS — I10 Essential (primary) hypertension: Secondary | ICD-10-CM

## 2018-11-29 ENCOUNTER — Other Ambulatory Visit: Payer: Self-pay | Admitting: Family Medicine

## 2018-11-29 DIAGNOSIS — I10 Essential (primary) hypertension: Secondary | ICD-10-CM

## 2018-11-29 DIAGNOSIS — E785 Hyperlipidemia, unspecified: Secondary | ICD-10-CM

## 2018-12-01 ENCOUNTER — Telehealth: Payer: Self-pay | Admitting: Family Medicine

## 2018-12-01 NOTE — Telephone Encounter (Signed)
Can we send in the patients bp medication Olmesartan to last until her appt. She has a CPE scheduled for 12/22/2018 with Dr. Carlota Raspberry. Pt states she did not get the medication from the pharmacy the last time it was sent in which was on 10/01/2018. Pt states she has one week left

## 2018-12-02 NOTE — Telephone Encounter (Signed)
Spoke with patient has a 90 day refill at [facility] .  Patient will call for refill.

## 2018-12-22 ENCOUNTER — Encounter: Payer: Self-pay | Admitting: Family Medicine

## 2018-12-22 ENCOUNTER — Other Ambulatory Visit: Payer: Self-pay

## 2018-12-22 ENCOUNTER — Ambulatory Visit (INDEPENDENT_AMBULATORY_CARE_PROVIDER_SITE_OTHER): Payer: PPO | Admitting: Family Medicine

## 2018-12-22 VITALS — BP 140/80 | HR 73 | Temp 97.7°F | Wt 161.8 lb

## 2018-12-22 DIAGNOSIS — Z0000 Encounter for general adult medical examination without abnormal findings: Secondary | ICD-10-CM

## 2018-12-22 DIAGNOSIS — I10 Essential (primary) hypertension: Secondary | ICD-10-CM | POA: Diagnosis not present

## 2018-12-22 DIAGNOSIS — R42 Dizziness and giddiness: Secondary | ICD-10-CM

## 2018-12-22 DIAGNOSIS — K51919 Ulcerative colitis, unspecified with unspecified complications: Secondary | ICD-10-CM

## 2018-12-22 DIAGNOSIS — Z23 Encounter for immunization: Secondary | ICD-10-CM | POA: Diagnosis not present

## 2018-12-22 DIAGNOSIS — R252 Cramp and spasm: Secondary | ICD-10-CM

## 2018-12-22 DIAGNOSIS — M810 Age-related osteoporosis without current pathological fracture: Secondary | ICD-10-CM | POA: Diagnosis not present

## 2018-12-22 DIAGNOSIS — Z0001 Encounter for general adult medical examination with abnormal findings: Secondary | ICD-10-CM | POA: Diagnosis not present

## 2018-12-22 DIAGNOSIS — E785 Hyperlipidemia, unspecified: Secondary | ICD-10-CM

## 2018-12-22 DIAGNOSIS — R0981 Nasal congestion: Secondary | ICD-10-CM

## 2018-12-22 MED ORDER — MESALAMINE 1.2 G PO TBEC: 1.2000 g | DELAYED_RELEASE_TABLET | Freq: Two times a day (BID) | ORAL | 1 refills | Status: DC

## 2018-12-22 MED ORDER — AMLODIPINE BESYLATE 10 MG PO TABS: ORAL_TABLET | ORAL | 3 refills | Status: DC

## 2018-12-22 MED ORDER — OLMESARTAN MEDOXOMIL 20 MG PO TABS: 20.0000 mg | ORAL_TABLET | Freq: Every day | ORAL | 3 refills | Status: DC

## 2018-12-22 MED ORDER — ATORVASTATIN CALCIUM 20 MG PO TABS: 20.0000 mg | ORAL_TABLET | Freq: Every day | ORAL | 3 refills | Status: DC

## 2018-12-22 MED ORDER — FLUTICASONE PROPIONATE 50 MCG/ACT NA SUSP: 2.0000 | Freq: Every day | NASAL | 6 refills | Status: DC

## 2018-12-22 NOTE — Progress Notes (Signed)
Subjective:    Patient ID: Jessica Stephens, female    DOB: 1950-04-09, 68 y.o.   MRN: [id number]  HPI Jessica Stephens is a 68 y.o. female Presents today for: Chief Complaint  Patient presents with  . Annual Exam    here today for annual phys. Patient wants her rx to be a 90 day supply with 3 refill   Here for annual exam/wellness visit as well as review of medications.  Care team: Gastroenterology, Dr. Collene Mares.  Ophthalmology, [facility], [facility] - optho.  PCP: me.   Hypertension: BP Readings from Last 3 Encounters:  12/22/18 140/80  02/21/18 (!) 164/99  02/18/18 (!) 150/62   Lab Results  Component Value Date   CREATININE 0.64 02/18/2018  Amlodipine 10 mg daily.  Benicar 20 mg daily. No new side effects.  Home readings: no recent home readings.  lightheaded at times - notes if bending forward for awhile, or if tired.  Does work on ladder at times.  No syncope.  Drinking more water recently, cramps in legs at times. Thinks not enough time to drink water at work. Cramps less with more water intake recently.  Trouble sleeping at times - hyped up, trouble remaining asleep at times.  Has been dealing with this for awhile.   Hyperlipidemia:  Lab Results  Component Value Date   CHOL 168 02/18/2018   HDL 66 02/18/2018   LDLCALC 87 02/18/2018   TRIG 73 02/18/2018   CHOLHDL 2.5 02/18/2018   Lab Results  Component Value Date   ALT 26 02/18/2018   AST 32 02/18/2018   ALKPHOS 132 (H) 02/18/2018   BILITOT 0.7 02/18/2018  Lipitor 20 mg daily. Muscle cramps above. Improved with water. No new side effects.   Allergic Rhinitis: flonase works well during spring and fall. Saline NS if needed. neti pot if needed.   Osteoporosis: Prior use of Fosamax for a few years when discussed in 2018. concern for possible side effects, but did not have any.  2009 DEXA scan with T score -4.2 at the lumbar spine, -2.9 at left femur.  Plan for repeat bone density when discussed in 2018, but  has not had recent eval. Has not had done - was told she would have to pay for it.   Ulcerative colitis: Gastroenterology, Dr. Collene Mares Lialda 400 mg 1 twice per day ordered, taking once per day and doing ok.  Encounter with Dr. Collene Mares on November 05, 2017, but unable to read details through electronic health record. Recommended colonoscopy - patient declined - wants to wait until 5 years. No appt scheduled   Cancer screening: Colonoscopy: 03/07/2015 noted in chart with repeat in 2 years, did see Dr. Collene Mares last year as above. Mammogram 12/24/2016. Has not scheduled.  Normal Pap testing 12/07/2016.   Immunization History  Administered Date(s) Administered  . Influenza Split 01/03/2012  . Influenza Whole 01/30/2008  . Influenza, High Dose Seasonal PF 01/26/2018  . Influenza,inj,Quad PF,6+ Mos 01/22/2013, 01/27/2014, 02/08/2015, 01/20/2016  . Influenza-Unspecified 02/15/2017  . Pneumococcal Conjugate-13 01/27/2014  . Pneumococcal Polysaccharide-23 12/22/2018  . Tdap 02/08/2015  Shingles: has not had.   Fall screen: No falls in the past year, no loose rugs, appropriate lighting in the home, stairs 1 flight of stairs, banister.   Depression screen [facility] 2/9 12/22/2018 02/18/2018 01/14/2018 02/08/2017 12/07/2016  Decreased Interest 0 0 0 0 0  Down, Depressed, Hopeless 0 0 0 0 0  PHQ - 2 Score 0 0 0 0 0  Functional Status Survey: Is the patient deaf or have difficulty hearing?: No(left ear hearing) Does the patient have difficulty seeing, even when wearing glasses/contacts?: Yes(will be seeing eye dr soon) Does the patient have difficulty concentrating, remembering, or making decisions?: No Does the patient have difficulty walking or climbing stairs?: No Does the patient have difficulty dressing or bathing?: No Does the patient have difficulty doing errands alone such as visiting a doctor's office or shopping?: No   6CIT Screen 12/22/2018  What Year? 0 points  What month? 0 points  What time? 0  points  Count back from 20 0 points  Months in reverse 0 points  Repeat phrase 2 points  Total Score 2     Hearing Screening   _0  _1  _2  _3  _4  _5  _6  _7  _8   Right ear:           Left ear:             Visual Acuity Screening   Right eye Left eye Both eyes  Without correction:     With correction: 20/40 20/200 20/40  overdue for visit with eye specialists. Hx of cataracts, and macular degeneration. few years. Some blurring.  Dental: has not been in years.   Exercise: min exercise recently with more work.  Going up and down stairs is exercise. 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   Advanced directives:  Not sure if she has. Paperwork.   Tobacco: none.   Alcohol: rare, less than 1 glass per month. 3    Patient Active Problem List   Diagnosis Date Noted  . Lower urinary tract symptoms (LUTS) 12/05/2016  . Acute UTI 12/05/2016  . Flank pain, acute 12/05/2016  . Prolonged Q-T interval on ECG 08/21/2014  . Lobar pneumonia due to unspecified organism 08/21/2014  . CAP (community acquired pneumonia) 08/19/2014  . Community acquired pneumonia 08/19/2014  . Sepsis ([place]) 08/19/2014  . Hypokalemia 08/19/2014  . HYPERLIPIDEMIA 05/29/2010  . DYSPNEA 05/29/2010  . CHEST PAIN, ATYPICAL 05/02/2010  . ESOPHAGEAL STRICTURE 04/07/2010  . HIATAL HERNIA 04/07/2010  . ULCERATIVE PROCTITIS 04/07/2010  . ARTHRITIS 04/07/2010  . LUQ PAIN 04/07/2010  . TRANSAMINASES, SERUM, ELEVATED 04/07/2010  . VITAMIN B12 DEFICIENCY 01/30/2008  . ANXIETY, CHRONIC 01/29/2008  . Essential hypertension 01/29/2008  . CHRONIC MAXILLARY SINUSITIS 04/24/2007  . G E R D 04/24/2007  . UNSPECIFIED OSTEOPOROSIS 04/24/2007  . ULCERATIVE COLITIS, LEFT SIDED 02/25/2007   Past Medical History:  Diagnosis Date  . Abnormal transaminases   . Anxiety   . Arthritis   . Esophageal stricture   . GERD (gastroesophageal reflux disease)   . Hiatal hernia   . Hyperlipidemia   .  Hypertension   . Maxillary sinusitis   . Proctitis   . Ulcerative colitis ([place])    left sided  . Vitamin B12 deficiency    Past Surgical History:  Procedure Laterality Date  . CESAREAN SECTION    . nose surg    . NOSE SURGERY     Allergies  Allergen Reactions  . Alendronate Sodium   . Sulfonamide Derivatives    Prior to Admission medications   Medication Sig Start Date End Date Taking? Authorizing Provider  amLODipine (NORVASC) 10 MG tablet TAKE 1 TABLET BY MOUTH EVERY DAY FOR BLOOD PRESSURE 11/29/18  Yes [name], MD  atorvastatin (LIPITOR) 20 MG tablet TAKE 1 TABLET BY MOUTH EVERY DAY 11/29/18  Yes [name], MD  calcium-vitamin D (OSCAL WITH D) 500-200 MG-UNIT  tablet Take 1 tablet by mouth.   Yes [provider]  cetirizine (ZYRTEC ALLERGY) 10 MG tablet Take 1 tablet (10 mg total) by mouth daily. 01/27/14  Yes Robyn Haber, MD  esomeprazole (NEXIUM) 20 MG capsule Take 20 mg by mouth daily as needed (indigestion).    Yes [provider]  fluticasone (FLONASE) 50 MCG/ACT nasal spray Place 2 sprays into both nostrils daily. 01/14/18  Yes [name], MD  Mesalamine (LIALDA PO) Take by mouth.   Yes [provider]  Multiple Vitamins-Minerals (VISION VITAMINS) TABS Take 1 tablet by mouth daily.   Yes [provider]  olmesartan (BENICAR) 20 MG tablet Take 1 tablet (20 mg total) by mouth daily. SCHEDULE OFFICE VISIT 10/01/18  Yes [name], MD   Social History   Socioeconomic History  . Marital status: Married    Spouse name: Not on file  . Number of children: Not on file  . Years of education: Not on file  . Highest education level: Not on file  Occupational History  . Not on file  Social Needs  . Financial resource strain: Not on file  . Food insecurity    Worry: Not on file    Inability: Not on file  . Transportation needs    Medical: Not on file    Non-medical: Not on file  Tobacco Use  . Smoking  status: Never Smoker  . Smokeless tobacco: Never Used  Substance and Sexual Activity  . Alcohol use: No  . Drug use: No  . Sexual activity: Yes    Birth control/protection: None  Lifestyle  . Physical activity    Days per week: Not on file    Minutes per session: Not on file  . Stress: Not on file  Relationships  . Social Herbalist on phone: Not on file    Gets together: Not on file    Attends religious service: Not on file    Active member of club or organization: Not on file    Attends meetings of clubs or organizations: Not on file    Relationship status: Not on file  . Intimate partner violence    Fear of current or ex partner: Not on file    Emotionally abused: Not on file    Physically abused: Not on file    Forced sexual activity: Not on file  Other Topics Concern  . Not on file  Social History Narrative  . Not on file    Review of Systems 13 point review of systems per patient health survey noted.  Negative other than as indicated above or in HPI.      Objective:   Physical Exam Constitutional:      Appearance: She is well-developed.  HENT:     Head: Normocephalic and atraumatic.     Right Ear: External ear normal.     Left Ear: External ear normal.  Eyes:     Conjunctiva/sclera: Conjunctivae normal.     Pupils: Pupils are equal, round, and reactive to light.  Neck:     Musculoskeletal: Normal range of motion and neck supple.     Thyroid: No thyromegaly.  Cardiovascular:     Rate and Rhythm: Normal rate and regular rhythm.     Heart sounds: Normal heart sounds. No murmur.  Pulmonary:     Effort: Pulmonary effort is normal. No respiratory distress.     Breath sounds: Normal breath sounds. No wheezing.  Abdominal:  General: Bowel sounds are normal.     Palpations: Abdomen is soft.     Tenderness: There is no abdominal tenderness.  Musculoskeletal: Normal range of motion.        General: No tenderness.  Lymphadenopathy:     Cervical: No  cervical adenopathy.  Skin:    General: Skin is warm and dry.     Findings: No rash.  Neurological:     General: No focal deficit present.     Mental Status: She is alert and oriented to person, place, and time.     Coordination: Coordination normal.     Gait: Gait normal.  Psychiatric:        Behavior: Behavior normal.        Thought Content: Thought content normal.    Vitals:   12/22/18 1043  BP: 140/80  Pulse: 73  Temp: 97.7 F (36.5 C)  TempSrc: Oral  SpO2: 98%  Weight: 161 lb 12.8 oz (73.4 kg)      Assessment & Plan:    Jessica Stephens is a 68 y.o. female Medicare annual wellness visit, subsequent - anticipatory guidance as below in AVS, screening labs if needed. Health maintenance items as above in HPI discussed/recommended as applicable.  - no concerning responses on depression, fall, or functional status screening. Any positive responses noted as above. Advanced directives discussed as in CHL.  -Follow-up discussed with dentist, optometrist, mammogram, and recommended shingles vaccine  Need for prophylactic vaccination against Streptococcus pneumoniae (pneumococcus) - Plan: Pneumococcal polysaccharide vaccine 23-valent greater than or equal to 2yo subcutaneous/IM  Essential hypertension - Plan: amLODipine (NORVASC) 10 MG tablet, olmesartan (BENICAR) 20 MG tablet Episode of dizziness - Plan: CBC, TSH, Comprehensive metabolic panel   - borderline elevated, not low. Advised to increase fluids, orthostatic precautions, continue same meds for now an recheck in next 1 month to discuss episodic dizziness, rtc/er precautions if worse sooner. Labs as above.   Hyperlipidemia, unspecified hyperlipidemia type - Plan: atorvastatin (LIPITOR) 20 MG tablet, Lipid panel Muscle cramp - Plan: CK  - check CPK. Increase hydration. Continue same dose lipitor for now.   Osteoporosis without current pathological fracture, unspecified osteoporosis type - Plan: DG Bone Density  - prior  significant osteoporosis with short term bisphosphonate Rx. No recent testing. continue calcium/vit D supplement, updated testing ordered then likely endocrinology eval for appropriate agent.   Nasal congestion - Plan: fluticasone (FLONASE) 50 MCG/ACT nasal spray  Ulcerative colitis with complication, unspecified location ([place]) - Plan: mesalamine (LIALDA) 1.2 g EC tablet  -Refilled Lialda temporarily but did recommend she follow-up with gastroenterology.  Meds ordered this encounter  Medications  . amLODipine (NORVASC) 10 MG tablet    Sig: TAKE 1 TABLET BY MOUTH EVERY DAY FOR BLOOD PRESSURE    Dispense:  90 tablet    Refill:  3  . atorvastatin (LIPITOR) 20 MG tablet    Sig: Take 1 tablet (20 mg total) by mouth daily.    Dispense:  90 tablet    Refill:  3  . olmesartan (BENICAR) 20 MG tablet    Sig: Take 1 tablet (20 mg total) by mouth daily.    Dispense:  90 tablet    Refill:  3  . fluticasone (FLONASE) 50 MCG/ACT nasal spray    Sig: Place 2 sprays into both nostrils daily.    Dispense:  16 g    Refill:  6  . mesalamine (LIALDA) 1.2 g EC tablet    Sig: Take 1-2 tablets (1.2-2.4 g  total) by mouth 2 (two) times daily.    Dispense:  180 tablet    Refill:  1   Patient Instructions   Call your eye specialist for follow up as soon as possible.   Try to drink 64 ounces of water per day. Avoid work on ladders if at all possible if you are dizzy.  Return to the clinic or go to the nearest emergency room if any of your symptoms worsen or new symptoms occur.  See information on sleep, but we will discuss this further next visit.   I will refill Lialda for now, but follow up with Dr. Collene Mares to discuss this med and treatment of ulcerative colitis.   Please schedule mammogram as soon as possible as it has been 2 years.   I recommend shingles vaccine.   I recommend scheduling dental visit.   If you have a living will, bring a copy.     Preventive Care 57 Years and Older, Female  Preventive care refers to lifestyle choices and visits with your health care provider that can promote health and wellness. This includes:  A yearly physical exam. This is also called an annual well check.  Regular dental and eye exams.  Immunizations.  Screening for certain conditions.  Healthy lifestyle choices, such as diet and exercise. What can I expect for my preventive care visit? Physical exam Your health care provider will check:  Height and weight. These may be used to calculate body mass index (BMI), which is a measurement that tells if you are at a healthy weight.  Heart rate and blood pressure.  Your skin for abnormal spots. Counseling Your health care provider may ask you questions about:  Alcohol, tobacco, and drug use.  Emotional well-being.  Home and relationship well-being.  Sexual activity.  Eating habits.  History of falls.  Memory and ability to understand (cognition).  Work and work Statistician.  Pregnancy and menstrual history. What immunizations do I need?  Influenza (flu) vaccine  This is recommended every year. Tetanus, diphtheria, and pertussis (Tdap) vaccine  You may need a Td booster every 10 years. Varicella (chickenpox) vaccine  You may need this vaccine if you have not already been vaccinated. Zoster (shingles) vaccine  You may need this after age 24. Pneumococcal conjugate (PCV13) vaccine  One dose is recommended after age 64. Pneumococcal polysaccharide (PPSV23) vaccine  One dose is recommended after age 66. Measles, mumps, and rubella (MMR) vaccine  You may need at least one dose of MMR if you were born in 1957 or later. You may also need a second dose. Meningococcal conjugate (MenACWY) vaccine  You may need this if you have certain conditions. Hepatitis A vaccine  You may need this if you have certain conditions or if you travel or work in places where you may be exposed to hepatitis A. Hepatitis B vaccine   You may need this if you have certain conditions or if you travel or work in places where you may be exposed to hepatitis B. Haemophilus influenzae type b (Hib) vaccine  You may need this if you have certain conditions. You may receive vaccines as individual doses or as more than one vaccine together in one shot (combination vaccines). Talk with your health care provider about the risks and benefits of combination vaccines. What tests do I need? Blood tests  Lipid and cholesterol levels. These may be checked every 5 years, or more frequently depending on your overall health.  Hepatitis C test.  Hepatitis  B test. Screening  Lung cancer screening. You may have this screening every year starting at age 25 if you have a 30-pack-year history of smoking and currently smoke or have quit within the past 15 years.  Colorectal cancer screening. All adults should have this screening starting at age 64 and continuing until age 67. Your health care provider may recommend screening at age 55 if you are at increased risk. You will have tests every 1-10 years, depending on your results and the type of screening test.  Diabetes screening. This is done by checking your blood sugar (glucose) after you have not eaten for a while (fasting). You may have this done every 1-3 years.  Mammogram. This may be done every 1-2 years. Talk with your health care provider about how often you should have regular mammograms.  BRCA-related cancer screening. This may be done if you have a family history of breast, ovarian, tubal, or peritoneal cancers. Other tests  Sexually transmitted disease (STD) testing.  Bone density scan. This is done to screen for osteoporosis. You may have this done starting at age 63. Follow these instructions at home: Eating and drinking  Eat a diet that includes fresh fruits and vegetables, whole grains, lean protein, and low-fat dairy products. Limit your intake of foods with high amounts of  sugar, saturated fats, and salt.  Take vitamin and mineral supplements as recommended by your health care provider.  Do not drink alcohol if your health care provider tells you not to drink.  If you drink alcohol: ? Limit how much you have to 0-1 drink a day. ? Be aware of how much alcohol is in your drink. In the [place] one drink equals one 12 oz bottle of beer (355 mL), one 5 oz glass of wine (148 mL), or one 1 oz glass of hard liquor (44 mL). Lifestyle  Take daily care of your teeth and gums.  Stay active. Exercise for at least 30 minutes on 5 or more days each week.  Do not use any products that contain nicotine or tobacco, such as cigarettes, e-cigarettes, and chewing tobacco. If you need help quitting, ask your health care provider.  If you are sexually active, practice safe sex. Use a condom or other form of protection in order to prevent STIs (sexually transmitted infections).  Talk with your health care provider about taking a low-dose aspirin or statin. What's next?  Go to your health care provider once a year for a well check visit.  Ask your health care provider how often you should have your eyes and teeth checked.  Stay up to date on all vaccines. This information is not intended to replace advice given to you by your health care provider. Make sure you discuss any questions you have with your health care provider. Document Released: 04/15/2015 Document Revised: 03/13/2018 Document Reviewed: 03/13/2018 Elsevier Patient Education  2020 [place].    Insomnia Insomnia is a sleep disorder that makes it difficult to fall asleep or stay asleep. Insomnia can cause fatigue, low energy, difficulty concentrating, mood swings, and poor performance at work or school. There are three different ways to classify insomnia:  Difficulty falling asleep.  Difficulty staying asleep.  Waking up too early in the morning. Any type of insomnia can be long-term (chronic) or  short-term (acute). Both are common. Short-term insomnia usually lasts for three months or less. Chronic insomnia occurs at least three times a week for longer than three months. What are the causes? Insomnia  may be caused by another condition, situation, or substance, such as:  Anxiety.  Certain medicines.  Gastroesophageal reflux disease (GERD) or other gastrointestinal conditions.  Asthma or other breathing conditions.  Restless legs syndrome, sleep apnea, or other sleep disorders.  Chronic pain.  Menopause.  Stroke.  Abuse of alcohol, tobacco, or illegal drugs.  Mental health conditions, such as depression.  Caffeine.  Neurological disorders, such as Alzheimer's disease.  An overactive thyroid (hyperthyroidism). Sometimes, the cause of insomnia may not be known. What increases the risk? Risk factors for insomnia include:  Gender. Women are affected more often than men.  Age. Insomnia is more common as you get older.  Stress.  Lack of exercise.  Irregular work schedule or working night shifts.  Traveling between different time zones.  Certain medical and mental health conditions. What are the signs or symptoms? If you have insomnia, the main symptom is having trouble falling asleep or having trouble staying asleep. This may lead to other symptoms, such as:  Feeling fatigued or having low energy.  Feeling nervous about going to sleep.  Not feeling rested in the morning.  Having trouble concentrating.  Feeling irritable, anxious, or depressed. How is this diagnosed? This condition may be diagnosed based on:  Your symptoms and medical history. Your health care provider may ask about: ? Your sleep habits. ? Any medical conditions you have. ? Your mental health.  A physical exam. How is this treated? Treatment for insomnia depends on the cause. Treatment may focus on treating an underlying condition that is causing insomnia. Treatment may also  include:  Medicines to help you sleep.  Counseling or therapy.  Lifestyle adjustments to help you sleep better. Follow these instructions at home: Eating and drinking   Limit or avoid alcohol, caffeinated beverages, and cigarettes, especially close to bedtime. These can disrupt your sleep.  Do not eat a large meal or eat spicy foods right before bedtime. This can lead to digestive discomfort that can make it hard for you to sleep. Sleep habits   Keep a sleep diary to help you and your health care provider figure out what could be causing your insomnia. Write down: ? When you sleep. ? When you wake up during the night. ? How well you sleep. ? How rested you feel the next day. ? Any side effects of medicines you are taking. ? What you eat and drink.  Make your bedroom a dark, comfortable place where it is easy to fall asleep. ? Put up shades or blackout curtains to block light from outside. ? Use a white noise machine to block noise. ? Keep the temperature cool.  Limit screen use before bedtime. This includes: ? Watching TV. ? Using your smartphone, tablet, or computer.  Stick to a routine that includes going to bed and waking up at the same times every day and night. This can help you fall asleep faster. Consider making a quiet activity, such as reading, part of your nighttime routine.  Try to avoid taking naps during the day so that you sleep better at night.  Get out of bed if you are still awake after 15 minutes of trying to sleep. Keep the lights down, but try reading or doing a quiet activity. When you feel sleepy, go back to bed. General instructions  Take over-the-counter and prescription medicines only as told by your health care provider.  Exercise regularly, as told by your health care provider. Avoid exercise starting several hours before bedtime.  Use relaxation techniques to manage stress. Ask your health care provider to suggest some techniques that may work  well for you. These may include: ? Breathing exercises. ? Routines to release muscle tension. ? Visualizing peaceful scenes.  Make sure that you drive carefully. Avoid driving if you feel very sleepy.  Keep all follow-up visits as told by your health care provider. This is important. Contact a health care provider if:  You are tired throughout the day.  You have trouble in your daily routine due to sleepiness.  You continue to have sleep problems, or your sleep problems get worse. Get help right away if:  You have serious thoughts about hurting yourself or someone else. If you ever feel like you may hurt yourself or others, or have thoughts about taking your own life, get help right away. You can go to your nearest emergency department or call:  Your local emergency services (911 in the [place]  A suicide crisis helpline, such as the [facility] at 913-143-0030. This is open 24 hours a day. Summary  Insomnia is a sleep disorder that makes it difficult to fall asleep or stay asleep.  Insomnia can be long-term (chronic) or short-term (acute).  Treatment for insomnia depends on the cause. Treatment may focus on treating an underlying condition that is causing insomnia.  Keep a sleep diary to help you and your health care provider figure out what could be causing your insomnia. This information is not intended to replace advice given to you by your health care provider. Make sure you discuss any questions you have with your health care provider. Document Released: 03/16/2000 Document Revised: 03/01/2017 Document Reviewed: 12/27/2016 Elsevier Patient Education  [place].    Dizziness Dizziness is a common problem. It is a feeling of unsteadiness or light-headedness. You may feel like you are about to faint. Dizziness can lead to injury if you stumble or fall. Anyone can become dizzy, but dizziness is more common in older adults. This condition can  be caused by a number of things, including medicines, dehydration, or illness. Follow these instructions at home: Eating and drinking  Drink enough fluid to keep your urine clear or pale yellow. This helps to keep you from becoming dehydrated. Try to drink more clear fluids, such as water.  Do not drink alcohol.  Limit your caffeine intake if told to do so by your health care provider. Check ingredients and nutrition facts to see if a food or beverage contains caffeine.  Limit your salt (sodium) intake if told to do so by your health care provider. Check ingredients and nutrition facts to see if a food or beverage contains sodium. Activity  Avoid making quick movements. ? Rise slowly from chairs and steady yourself until you feel okay. ? In the morning, first sit up on the side of the bed. When you feel okay, stand slowly while you hold onto something until you know that your balance is fine.  If you need to stand in one place for a long time, move your legs often. Tighten and relax the muscles in your legs while you are standing.  Do not drive or use heavy machinery if you feel dizzy.  Avoid bending down if you feel dizzy. Place items in your home so that they are easy for you to reach without leaning over. Lifestyle  Do not use any products that contain nicotine or tobacco, such as cigarettes and e-cigarettes. If you need help quitting, ask your  health care provider.  Try to reduce your stress level by using methods such as yoga or meditation. Talk with your health care provider if you need help to manage your stress. General instructions  Watch your dizziness for any changes.  Take over-the-counter and prescription medicines only as told by your health care provider. Talk with your health care provider if you think that your dizziness is caused by a medicine that you are taking.  Tell a friend or a family member that you are feeling dizzy. If he or she notices any changes in your  behavior, have this person call your health care provider.  Keep all follow-up visits as told by your health care provider. This is important. Contact a health care provider if:  Your dizziness does not go away.  Your dizziness or light-headedness gets worse.  You feel nauseous.  You have reduced hearing.  You have new symptoms.  You are unsteady on your feet or you feel like the room is spinning. Get help right away if:  You vomit or have diarrhea and are unable to eat or drink anything.  You have problems talking, walking, swallowing, or using your arms, hands, or legs.  You feel generally weak.  You are not thinking clearly or you have trouble forming sentences. It may take a friend or family member to notice this.  You have chest pain, abdominal pain, shortness of breath, or sweating.  Your vision changes.  You have any bleeding.  You have a severe headache.  You have neck pain or a stiff neck.  You have a fever. These symptoms may represent a serious problem that is an emergency. Do not wait to see if the symptoms will go away. Get medical help right away. Call your local emergency services (911 in the [place] Do not drive yourself to the hospital. Summary  Dizziness is a feeling of unsteadiness or light-headedness. This condition can be caused by a number of things, including medicines, dehydration, or illness.  Anyone can become dizzy, but dizziness is more common in older adults.  Drink enough fluid to keep your urine clear or pale yellow. Do not drink alcohol.  Avoid making quick movements if you feel dizzy. Monitor your dizziness for any changes. This information is not intended to replace advice given to you by your health care provider. Make sure you discuss any questions you have with your health care provider. Document Released: 09/12/2000 Document Revised: 03/22/2017 Document Reviewed: 04/21/2016 Elsevier Patient Education  [employer].    If  you have lab work done today you will be contacted with your lab results within the next 2 weeks.  If you have not heard from [place] then please contact us. The fastest way to get your results is to register for My Chart.   IF you received an x-ray today, you will receive an invoice from [facility]. Please contact [facility] at [phone] with questions or concerns regarding your invoice.   IF you received labwork today, you will receive an invoice from [place]. Please contact [facility] at [phone] with questions or concerns regarding your invoice.   Our billing staff will not be able to assist you with questions regarding bills from these companies.  You will be contacted with the lab results as soon as they are available. The fastest way to get your results is to activate your My Chart account. Instructions are located on the last page of this paperwork. If you have not heard from [place]  regarding the results in 2 weeks, please contact this office.       Signed,   Merri Ray, MD [facility] [place].  12/23/18 9:54 AM

## 2018-12-22 NOTE — Patient Instructions (Addendum)
Call your eye specialist for follow up as soon as possible.   Try to drink 64 ounces of water per day. Avoid work on ladders if at all possible if you are dizzy.  Return to the clinic or go to the nearest emergency room if any of your symptoms worsen or new symptoms occur.  See information on sleep, but we will discuss this further next visit.   I will refill Lialda for now, but follow up with Dr. Collene Mares to discuss this med and treatment of ulcerative colitis.   Please schedule mammogram as soon as possible as it has been 2 years.   I recommend shingles vaccine.   I recommend scheduling dental visit.   If you have a living will, bring a copy.     Preventive Care 66 Years and Older, Female Preventive care refers to lifestyle choices and visits with your health care provider that can promote health and wellness. This includes:  A yearly physical exam. This is also called an annual well check.  Regular dental and eye exams.  Immunizations.  Screening for certain conditions.  Healthy lifestyle choices, such as diet and exercise. What can I expect for my preventive care visit? Physical exam Your health care provider will check:  Height and weight. These may be used to calculate body mass index (BMI), which is a measurement that tells if you are at a healthy weight.  Heart rate and blood pressure.  Your skin for abnormal spots. Counseling Your health care provider may ask you questions about:  Alcohol, tobacco, and drug use.  Emotional well-being.  Home and relationship well-being.  Sexual activity.  Eating habits.  History of falls.  Memory and ability to understand (cognition).  Work and work Statistician.  Pregnancy and menstrual history. What immunizations do I need?  Influenza (flu) vaccine  This is recommended every year. Tetanus, diphtheria, and pertussis (Tdap) vaccine  You may need a Td booster every 10 years. Varicella (chickenpox) vaccine  You  may need this vaccine if you have not already been vaccinated. Zoster (shingles) vaccine  You may need this after age 33. Pneumococcal conjugate (PCV13) vaccine  One dose is recommended after age 15. Pneumococcal polysaccharide (PPSV23) vaccine  One dose is recommended after age 68. Measles, mumps, and rubella (MMR) vaccine  You may need at least one dose of MMR if you were born in 1957 or later. You may also need a second dose. Meningococcal conjugate (MenACWY) vaccine  You may need this if you have certain conditions. Hepatitis A vaccine  You may need this if you have certain conditions or if you travel or work in places where you may be exposed to hepatitis A. Hepatitis B vaccine  You may need this if you have certain conditions or if you travel or work in places where you may be exposed to hepatitis B. Haemophilus influenzae type b (Hib) vaccine  You may need this if you have certain conditions. You may receive vaccines as individual doses or as more than one vaccine together in one shot (combination vaccines). Talk with your health care provider about the risks and benefits of combination vaccines. What tests do I need? Blood tests  Lipid and cholesterol levels. These may be checked every 5 years, or more frequently depending on your overall health.  Hepatitis C test.  Hepatitis B test. Screening  Lung cancer screening. You may have this screening every year starting at age 40 if you have a 30-pack-year history of smoking and  currently smoke or have quit within the past 15 years.  Colorectal cancer screening. All adults should have this screening starting at age 19 and continuing until age 38. Your health care provider may recommend screening at age 61 if you are at increased risk. You will have tests every 1-10 years, depending on your results and the type of screening test.  Diabetes screening. This is done by checking your blood sugar (glucose) after you have not eaten  for a while (fasting). You may have this done every 1-3 years.  Mammogram. This may be done every 1-2 years. Talk with your health care provider about how often you should have regular mammograms.  BRCA-related cancer screening. This may be done if you have a family history of breast, ovarian, tubal, or peritoneal cancers. Other tests  Sexually transmitted disease (STD) testing.  Bone density scan. This is done to screen for osteoporosis. You may have this done starting at age 35. Follow these instructions at home: Eating and drinking  Eat a diet that includes fresh fruits and vegetables, whole grains, lean protein, and low-fat dairy products. Limit your intake of foods with high amounts of sugar, saturated fats, and salt.  Take vitamin and mineral supplements as recommended by your health care provider.  Do not drink alcohol if your health care provider tells you not to drink.  If you drink alcohol: ? Limit how much you have to 0-1 drink a day. ? Be aware of how much alcohol is in your drink. In the [place] one drink equals one 12 oz bottle of beer (355 mL), one 5 oz glass of wine (148 mL), or one 1 oz glass of hard liquor (44 mL). Lifestyle  Take daily care of your teeth and gums.  Stay active. Exercise for at least 30 minutes on 5 or more days each week.  Do not use any products that contain nicotine or tobacco, such as cigarettes, e-cigarettes, and chewing tobacco. If you need help quitting, ask your health care provider.  If you are sexually active, practice safe sex. Use a condom or other form of protection in order to prevent STIs (sexually transmitted infections).  Talk with your health care provider about taking a low-dose aspirin or statin. What's next?  Go to your health care provider once a year for a well check visit.  Ask your health care provider how often you should have your eyes and teeth checked.  Stay up to date on all vaccines. This information is not  intended to replace advice given to you by your health care provider. Make sure you discuss any questions you have with your health care provider. Document Released: 04/15/2015 Document Revised: 03/13/2018 Document Reviewed: 03/13/2018 Elsevier Patient Education  2020 [place].    Insomnia Insomnia is a sleep disorder that makes it difficult to fall asleep or stay asleep. Insomnia can cause fatigue, low energy, difficulty concentrating, mood swings, and poor performance at work or school. There are three different ways to classify insomnia:  Difficulty falling asleep.  Difficulty staying asleep.  Waking up too early in the morning. Any type of insomnia can be long-term (chronic) or short-term (acute). Both are common. Short-term insomnia usually lasts for three months or less. Chronic insomnia occurs at least three times a week for longer than three months. What are the causes? Insomnia may be caused by another condition, situation, or substance, such as:  Anxiety.  Certain medicines.  Gastroesophageal reflux disease (GERD) or other gastrointestinal conditions.  Asthma  or other breathing conditions.  Restless legs syndrome, sleep apnea, or other sleep disorders.  Chronic pain.  Menopause.  Stroke.  Abuse of alcohol, tobacco, or illegal drugs.  Mental health conditions, such as depression.  Caffeine.  Neurological disorders, such as Alzheimer's disease.  An overactive thyroid (hyperthyroidism). Sometimes, the cause of insomnia may not be known. What increases the risk? Risk factors for insomnia include:  Gender. Women are affected more often than men.  Age. Insomnia is more common as you get older.  Stress.  Lack of exercise.  Irregular work schedule or working night shifts.  Traveling between different time zones.  Certain medical and mental health conditions. What are the signs or symptoms? If you have insomnia, the main symptom is having trouble  falling asleep or having trouble staying asleep. This may lead to other symptoms, such as:  Feeling fatigued or having low energy.  Feeling nervous about going to sleep.  Not feeling rested in the morning.  Having trouble concentrating.  Feeling irritable, anxious, or depressed. How is this diagnosed? This condition may be diagnosed based on:  Your symptoms and medical history. Your health care provider may ask about: ? Your sleep habits. ? Any medical conditions you have. ? Your mental health.  A physical exam. How is this treated? Treatment for insomnia depends on the cause. Treatment may focus on treating an underlying condition that is causing insomnia. Treatment may also include:  Medicines to help you sleep.  Counseling or therapy.  Lifestyle adjustments to help you sleep better. Follow these instructions at home: Eating and drinking   Limit or avoid alcohol, caffeinated beverages, and cigarettes, especially close to bedtime. These can disrupt your sleep.  Do not eat a large meal or eat spicy foods right before bedtime. This can lead to digestive discomfort that can make it hard for you to sleep. Sleep habits   Keep a sleep diary to help you and your health care provider figure out what could be causing your insomnia. Write down: ? When you sleep. ? When you wake up during the night. ? How well you sleep. ? How rested you feel the next day. ? Any side effects of medicines you are taking. ? What you eat and drink.  Make your bedroom a dark, comfortable place where it is easy to fall asleep. ? Put up shades or blackout curtains to block light from outside. ? Use a white noise machine to block noise. ? Keep the temperature cool.  Limit screen use before bedtime. This includes: ? Watching TV. ? Using your smartphone, tablet, or computer.  Stick to a routine that includes going to bed and waking up at the same times every day and night. This can help you fall  asleep faster. Consider making a quiet activity, such as reading, part of your nighttime routine.  Try to avoid taking naps during the day so that you sleep better at night.  Get out of bed if you are still awake after 15 minutes of trying to sleep. Keep the lights down, but try reading or doing a quiet activity. When you feel sleepy, go back to bed. General instructions  Take over-the-counter and prescription medicines only as told by your health care provider.  Exercise regularly, as told by your health care provider. Avoid exercise starting several hours before bedtime.  Use relaxation techniques to manage stress. Ask your health care provider to suggest some techniques that may work well for you. These may include: ? Breathing  exercises. ? Routines to release muscle tension. ? Visualizing peaceful scenes.  Make sure that you drive carefully. Avoid driving if you feel very sleepy.  Keep all follow-up visits as told by your health care provider. This is important. Contact a health care provider if:  You are tired throughout the day.  You have trouble in your daily routine due to sleepiness.  You continue to have sleep problems, or your sleep problems get worse. Get help right away if:  You have serious thoughts about hurting yourself or someone else. If you ever feel like you may hurt yourself or others, or have thoughts about taking your own life, get help right away. You can go to your nearest emergency department or call:  Your local emergency services (911 in the [place]  A suicide crisis helpline, such as the [facility] at 201-535-2780. This is open 24 hours a day. Summary  Insomnia is a sleep disorder that makes it difficult to fall asleep or stay asleep.  Insomnia can be long-term (chronic) or short-term (acute).  Treatment for insomnia depends on the cause. Treatment may focus on treating an underlying condition that is causing  insomnia.  Keep a sleep diary to help you and your health care provider figure out what could be causing your insomnia. This information is not intended to replace advice given to you by your health care provider. Make sure you discuss any questions you have with your health care provider. Document Released: 03/16/2000 Document Revised: 03/01/2017 Document Reviewed: 12/27/2016 Elsevier Patient Education  [place].    Dizziness Dizziness is a common problem. It is a feeling of unsteadiness or light-headedness. You may feel like you are about to faint. Dizziness can lead to injury if you stumble or fall. Anyone can become dizzy, but dizziness is more common in older adults. This condition can be caused by a number of things, including medicines, dehydration, or illness. Follow these instructions at home: Eating and drinking  Drink enough fluid to keep your urine clear or pale yellow. This helps to keep you from becoming dehydrated. Try to drink more clear fluids, such as water.  Do not drink alcohol.  Limit your caffeine intake if told to do so by your health care provider. Check ingredients and nutrition facts to see if a food or beverage contains caffeine.  Limit your salt (sodium) intake if told to do so by your health care provider. Check ingredients and nutrition facts to see if a food or beverage contains sodium. Activity  Avoid making quick movements. ? Rise slowly from chairs and steady yourself until you feel okay. ? In the morning, first sit up on the side of the bed. When you feel okay, stand slowly while you hold onto something until you know that your balance is fine.  If you need to stand in one place for a long time, move your legs often. Tighten and relax the muscles in your legs while you are standing.  Do not drive or use heavy machinery if you feel dizzy.  Avoid bending down if you feel dizzy. Place items in your home so that they are easy for you to reach  without leaning over. Lifestyle  Do not use any products that contain nicotine or tobacco, such as cigarettes and e-cigarettes. If you need help quitting, ask your health care provider.  Try to reduce your stress level by using methods such as yoga or meditation. Talk with your health care provider if you  need help to manage your stress. General instructions  Watch your dizziness for any changes.  Take over-the-counter and prescription medicines only as told by your health care provider. Talk with your health care provider if you think that your dizziness is caused by a medicine that you are taking.  Tell a friend or a family member that you are feeling dizzy. If he or she notices any changes in your behavior, have this person call your health care provider.  Keep all follow-up visits as told by your health care provider. This is important. Contact a health care provider if:  Your dizziness does not go away.  Your dizziness or light-headedness gets worse.  You feel nauseous.  You have reduced hearing.  You have new symptoms.  You are unsteady on your feet or you feel like the room is spinning. Get help right away if:  You vomit or have diarrhea and are unable to eat or drink anything.  You have problems talking, walking, swallowing, or using your arms, hands, or legs.  You feel generally weak.  You are not thinking clearly or you have trouble forming sentences. It may take a friend or family member to notice this.  You have chest pain, abdominal pain, shortness of breath, or sweating.  Your vision changes.  You have any bleeding.  You have a severe headache.  You have neck pain or a stiff neck.  You have a fever. These symptoms may represent a serious problem that is an emergency. Do not wait to see if the symptoms will go away. Get medical help right away. Call your local emergency services (911 in the [place] Do not drive yourself to the  hospital. Summary  Dizziness is a feeling of unsteadiness or light-headedness. This condition can be caused by a number of things, including medicines, dehydration, or illness.  Anyone can become dizzy, but dizziness is more common in older adults.  Drink enough fluid to keep your urine clear or pale yellow. Do not drink alcohol.  Avoid making quick movements if you feel dizzy. Monitor your dizziness for any changes. This information is not intended to replace advice given to you by your health care provider. Make sure you discuss any questions you have with your health care provider. Document Released: 09/12/2000 Document Revised: 03/22/2017 Document Reviewed: 04/21/2016 Elsevier Patient Education  [employer].    If you have lab work done today you will be contacted with your lab results within the next 2 weeks.  If you have not heard from [place] then please contact us. The fastest way to get your results is to register for My Chart.   IF you received an x-ray today, you will receive an invoice from [facility]. Please contact [facility] at [phone] with questions or concerns regarding your invoice.   IF you received labwork today, you will receive an invoice from [place]. Please contact [facility] at [phone] with questions or concerns regarding your invoice.   Our billing staff will not be able to assist you with questions regarding bills from these companies.  You will be contacted with the lab results as soon as they are available. The fastest way to get your results is to activate your My Chart account. Instructions are located on the last page of this paperwork. If you have not heard from [place] regarding the results in 2 weeks, please contact this office.

## 2018-12-23 ENCOUNTER — Encounter: Payer: Self-pay | Admitting: Family Medicine

## 2018-12-23 LAB — COMPREHENSIVE METABOLIC PANEL
ALT: 28 IU/L (ref 0–32)
AST: 27 IU/L (ref 0–40)
Albumin/Globulin Ratio: 1.7 (ref 1.2–2.2)
Albumin: 4.8 g/dL (ref 3.8–4.8)
Alkaline Phosphatase: 128 IU/L — ABNORMAL HIGH (ref 39–117)
BUN/Creatinine Ratio: 16 (ref 12–28)
BUN: 11 mg/dL (ref 8–27)
Bilirubin Total: 0.6 mg/dL (ref 0.0–1.2)
CO2: 23 mmol/L (ref 20–29)
Calcium: 10.2 mg/dL (ref 8.7–10.3)
Chloride: 103 mmol/L (ref 96–106)
Creatinine, Ser: 0.69 mg/dL (ref 0.57–1.00)
GFR calc Af Amer: 105 mL/min/{1.73_m2} (ref >59)
GFR calc non Af Amer: 91 mL/min/{1.73_m2} (ref >59)
Globulin, Total: 2.8 g/dL (ref 1.5–4.5)
Glucose: 83 mg/dL (ref 65–99)
Potassium: 3.9 mmol/L (ref 3.5–5.2)
Sodium: 142 mmol/L (ref 134–144)
Total Protein: 7.6 g/dL (ref 6.0–8.5)

## 2018-12-23 LAB — CBC
Hematocrit: 45.9 % (ref 34.0–46.6)
Hemoglobin: 15.9 g/dL (ref 11.1–15.9)
MCH: 30.5 pg (ref 26.6–33.0)
MCHC: 34.6 g/dL (ref 31.5–35.7)
MCV: 88 fL (ref 79–97)
Platelets: 327 10*3/uL (ref 150–450)
RBC: 5.22 x10E6/uL (ref 3.77–5.28)
RDW: 12.8 % (ref 11.7–15.4)
WBC: 7.9 10*3/uL (ref 3.4–10.8)

## 2018-12-23 LAB — LIPID PANEL
Chol/HDL Ratio: 2.6 ratio (ref 0.0–4.4)
Cholesterol, Total: 150 mg/dL (ref 100–199)
HDL: 57 mg/dL (ref >39)
LDL Chol Calc (NIH): 74 mg/dL (ref 0–99)
Triglycerides: 106 mg/dL (ref 0–149)
VLDL Cholesterol Cal: 19 mg/dL (ref 5–40)

## 2018-12-23 LAB — TSH: TSH: 1.53 u[IU]/mL (ref 0.450–4.500)

## 2018-12-23 LAB — CK: Total CK: 156 U/L (ref 32–182)

## 2018-12-29 ENCOUNTER — Other Ambulatory Visit: Payer: Self-pay | Admitting: Family Medicine

## 2018-12-29 DIAGNOSIS — Z1231 Encounter for screening mammogram for malignant neoplasm of breast: Secondary | ICD-10-CM

## 2018-12-30 ENCOUNTER — Encounter: Payer: Self-pay | Admitting: Radiology

## 2019-01-05 ENCOUNTER — Other Ambulatory Visit: Payer: Self-pay

## 2019-01-05 ENCOUNTER — Ambulatory Visit
Admission: RE | Admit: 2019-01-05 | Discharge: 2019-01-05 | Disposition: A | Discharge status: Home / Self Care | Payer: PPO | Source: Ambulatory Visit | Attending: Family Medicine | Admitting: Family Medicine

## 2019-01-05 DIAGNOSIS — Z1231 Encounter for screening mammogram for malignant neoplasm of breast: Secondary | ICD-10-CM

## 2019-01-06 ENCOUNTER — Ambulatory Visit
Admission: RE | Admit: 2019-01-06 | Discharge: 2019-01-06 | Disposition: A | Discharge status: Home / Self Care | Payer: PPO | Source: Ambulatory Visit | Attending: Family Medicine | Admitting: Family Medicine

## 2019-01-06 DIAGNOSIS — Z780 Asymptomatic menopausal state: Secondary | ICD-10-CM | POA: Diagnosis not present

## 2019-01-06 DIAGNOSIS — M810 Age-related osteoporosis without current pathological fracture: Secondary | ICD-10-CM | POA: Diagnosis not present

## 2019-01-13 ENCOUNTER — Encounter: Payer: Self-pay | Admitting: Radiology

## 2019-01-27 DIAGNOSIS — H353220 Exudative age-related macular degeneration, left eye, stage unspecified: Secondary | ICD-10-CM | POA: Diagnosis not present

## 2019-01-27 DIAGNOSIS — H52203 Unspecified astigmatism, bilateral: Secondary | ICD-10-CM | POA: Diagnosis not present

## 2019-01-27 DIAGNOSIS — H25812 Combined forms of age-related cataract, left eye: Secondary | ICD-10-CM | POA: Diagnosis not present

## 2019-01-27 DIAGNOSIS — H524 Presbyopia: Secondary | ICD-10-CM | POA: Diagnosis not present

## 2019-01-27 DIAGNOSIS — H25811 Combined forms of age-related cataract, right eye: Secondary | ICD-10-CM | POA: Diagnosis not present

## 2019-01-27 DIAGNOSIS — H25813 Combined forms of age-related cataract, bilateral: Secondary | ICD-10-CM | POA: Diagnosis not present

## 2019-01-27 DIAGNOSIS — H5213 Myopia, bilateral: Secondary | ICD-10-CM | POA: Diagnosis not present

## 2019-02-11 DIAGNOSIS — H2511 Age-related nuclear cataract, right eye: Secondary | ICD-10-CM | POA: Diagnosis not present

## 2019-02-11 DIAGNOSIS — H2512 Age-related nuclear cataract, left eye: Secondary | ICD-10-CM | POA: Diagnosis not present

## 2019-02-11 DIAGNOSIS — H25041 Posterior subcapsular polar age-related cataract, right eye: Secondary | ICD-10-CM | POA: Diagnosis not present

## 2019-02-11 DIAGNOSIS — H25042 Posterior subcapsular polar age-related cataract, left eye: Secondary | ICD-10-CM | POA: Diagnosis not present

## 2019-02-14 ENCOUNTER — Ambulatory Visit (HOSPITAL_COMMUNITY)
Admission: EM | Admit: 2019-02-14 | Discharge: 2019-02-14 | Disposition: A | Discharge status: Home / Self Care | Payer: PPO | Attending: Physician Assistant | Admitting: Physician Assistant

## 2019-02-14 ENCOUNTER — Encounter (HOSPITAL_COMMUNITY): Payer: Self-pay | Admitting: *Deleted

## 2019-02-14 ENCOUNTER — Other Ambulatory Visit: Payer: Self-pay

## 2019-02-14 DIAGNOSIS — M25562 Pain in left knee: Secondary | ICD-10-CM | POA: Diagnosis not present

## 2019-02-14 MED ORDER — MELOXICAM 15 MG PO TABS: ORAL_TABLET | ORAL | 1 refills | Status: DC

## 2019-02-14 MED ORDER — HYDROCODONE-ACETAMINOPHEN 5-325 MG PO TABS: 1.0000 | ORAL_TABLET | Freq: Four times a day (QID) | ORAL | 0 refills | Status: AC | PRN

## 2019-02-14 NOTE — Discharge Instructions (Addendum)
Could be a flare of osteoarthritis. Treat with Meloxicam daily x 7 days for inflammation, then use as needed. May use Hydrocodone for severe pain every 6 hours as needed. Rest and use moist heat or heating pad. Out of work tomorrow to rest. If worsens then make an appt with an orthopedic for xrays and treatment.

## 2019-02-14 NOTE — ED Triage Notes (Signed)
Denies injury.  C/O left knee pain onset 2 nights ago, with occasional radiation up left upper leg and left lower leg.  Has been taking IBU, applying heat.  CMS intact.

## 2019-02-14 NOTE — ED Provider Notes (Signed)
[place]    CSN: [id number] Arrival date & time: 02/14/19  1151      History   Chief Complaint Chief Complaint  Patient presents with  . Leg Pain    HPI Jessica Stephens is a 68 y.o. female.   Who presents with left knee pain. No trauma. This began yesterday while working. 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 She began having "severe" pain in her left knee, medial aspect. After continued walking the pain would radiate into her thigh and into her left foot. No acute back pain. No weakness in her leg. She does carries a history of "arthritis" in her low back. She had an Ibuprofen 827m without relief.      Past Medical History:  Diagnosis Date  . Abnormal transaminases   . Anxiety   . Arthritis   . Esophageal stricture   . GERD (gastroesophageal reflux disease)   . Hiatal hernia   . Hyperlipidemia   . Hypertension   . Maxillary sinusitis   . Proctitis   . Ulcerative colitis ([place]    left sided  . Vitamin B12 deficiency     Patient Active Problem List   Diagnosis Date Noted  . Lower urinary tract symptoms (LUTS) 12/05/2016  . Acute UTI 12/05/2016  . Flank pain, acute 12/05/2016  . Prolonged Q-T interval on ECG 08/21/2014  . Lobar pneumonia due to unspecified organism 08/21/2014  . CAP (community acquired pneumonia) 08/19/2014  . Community acquired pneumonia 08/19/2014  . [place] 08/19/[place]  . Hypokalemia 08/19/2014  . HYPERLIPIDEMIA 05/29/2010  . DYSPNEA 05/29/2010  . CHEST PAIN, ATYPICAL 05/02/2010  . ESOPHAGEAL STRICTURE 04/07/2010  . HIATAL HERNIA 04/07/2010  . ULCERATIVE PROCTITIS 04/07/2010  . ARTHRITIS 04/07/2010  . LUQ PAIN 04/07/2010  . TRANSAMINASES, SERUM, ELEVATED 04/07/2010  . VITAMIN B12 DEFICIENCY 01/30/2008  . ANXIETY, CHRONIC 01/29/2008  . Essential hypertension 01/29/2008  . CHRONIC MAXILLARY SINUSITIS 04/24/2007  . G E R D 04/24/2007  . UNSPECIFIED OSTEOPOROSIS 04/24/2007  . ULCERATIVE COLITIS, LEFT SIDED  02/25/2007    Past Surgical History:  Procedure Laterality Date  . CATARACT EXTRACTION    . CESAREAN SECTION    . nose surg    . NOSE SURGERY      OB History   No obstetric history on file.      Home Medications    Prior to Admission medications   Medication Sig Start Date End Date Taking? Authorizing Provider  amLODipine (NORVASC) 10 MG tablet TAKE 1 TABLET BY MOUTH EVERY DAY FOR BLOOD PRESSURE 12/22/18  Yes GWendie Agreste MD  atorvastatin (LIPITOR) 20 MG tablet Take 1 tablet (20 mg total) by mouth daily. 12/22/18  Yes GWendie Agreste MD  calcium-vitamin D (OSCAL WITH D) 500-200 MG-UNIT tablet Take 1 tablet by mouth.   Yes [provider]  mesalamine (LIALDA) 1.2 g EC tablet Take 1-2 tablets (1.2-2.4 g total) by mouth 2 (two) times daily. 12/22/18  Yes GWendie Agreste MD  Multiple Vitamins-Minerals (VISION VITAMINS) TABS Take 1 tablet by mouth daily.   Yes [provider]  olmesartan (BENICAR) 20 MG tablet Take 1 tablet (20 mg total) by mouth daily. 12/22/18  Yes GWendie Agreste MD  cetirizine (ZYRTEC ALLERGY) 10 MG tablet Take 1 tablet (10 mg total) by mouth daily. 01/27/14   LRobyn Haber MD  esomeprazole (NEXIUM) 20 MG capsule Take 20 mg by mouth daily as needed (indigestion).     [provider]  fluticasone (FLONASE) 50 MCG/ACT nasal spray Place 2 sprays into both nostrils daily. 12/22/18   [name], MD  HYDROcodone-acetaminophen (NORCO/VICODIN) 5-325 MG tablet Take 1 tablet by mouth every 6 (six) hours as needed for up to 5 days for moderate pain. 02/14/19 02/19/19  Bjorn Pippin, PA-C  meloxicam (MOBIC) 15 MG tablet 1 tablet every day with food for 1-2 weeks for knee inflammation, then use as needed 02/14/19   Bjorn Pippin, PA-C    Family History Family History  Problem Relation Age of Onset  . Arthritis Mother   . Dementia Mother   . Diabetes Father   . Hyperlipidemia Father   . Hypertension Father      Social History Social History   Tobacco Use  . Smoking status: Never Smoker  . Smokeless tobacco: Never Used  Substance Use Topics  . Alcohol use: Yes    Comment: occasionally  . Drug use: No     Allergies   Alendronate sodium and Sulfonamide derivatives   Review of Systems Review of Systems  Constitutional: Negative for fever.  Musculoskeletal: Positive for arthralgias and gait problem.  Skin: Negative for rash.  All other systems reviewed and are negative.    Physical Exam Triage Vital Signs ED Triage Vitals  Enc Vitals Group     BP 02/14/19 1207 (!) 156/70     Pulse Rate 02/14/19 1207 93     Resp 02/14/19 1207 16     Temp 02/14/19 1207 99.4 F (37.4 C)     Temp Source 02/14/19 1207 Other     SpO2 02/14/19 1207 100 %     Weight --      Height --      Head Circumference --      Peak Flow --      Pain Score 02/14/19 1208 5     Pain Loc --      Pain Edu? --      Excl. in [place]? --    No data found.  Updated Vital Signs BP (!) 156/70   Pulse 93   Temp 99.4 F (37.4 C) (Other (Comment))   Resp 16   SpO2 100%   Visual Acuity Right Eye Distance:   Left Eye Distance:   Bilateral Distance:    Right Eye Near:   Left Eye Near:    Bilateral Near:     Physical Exam Vitals signs and nursing note reviewed.  Constitutional:      General: She is not in acute distress.    Appearance: Normal appearance. She is normal weight. She is not ill-appearing.  HENT:     Head: Normocephalic and atraumatic.  Eyes:     General: No scleral icterus. Pulmonary:     Effort: Pulmonary effort is normal.  Musculoskeletal: Normal range of motion.     Comments: Left knee is cool and without effusion. Pain with full flexion, no pain with extension. Lumbar spine with full non-painful ROM, negative left SLR  Skin:    General: Skin is warm.     Findings: No rash.  Neurological:     General: No focal deficit present.     Mental Status: She is alert and oriented to person, place,  and time.     Motor: No weakness.     Gait: Gait abnormal.  Psychiatric:        Judgment: Judgment normal.      UC Treatments / Results  Labs (all labs ordered are listed, but only  abnormal results are displayed) Labs Reviewed - No data to display  EKG   Radiology No results found.  Procedures Procedures (including critical care time)  Medications Ordered in UC Medications - No data to display  Initial Impression / Assessment and Plan / UC Course  I have reviewed the triage vital signs and the nursing notes.  Pertinent labs & imaging results that were available during my care of the patient were reviewed by me and considered in my medical decision making (see chart for details).     Left knee pain without trauma. Suspect early OA. Treat with NSAID, sparingly pain medication and work note. If pain continues then f/u with Orthopedics. No indication for xrays today Final Clinical Impressions(s) / UC Diagnoses   Final diagnoses:  Acute pain of left knee     Discharge Instructions     Could be a flare of osteoarthritis. Treat with Meloxicam daily x 7 days for inflammation, then use as needed. May use Hydrocodone for severe pain every 6 hours as needed. Rest and use moist heat or heating pad. Out of work tomorrow to rest. If worsens then make an appt with an orthopedic for xrays and treatment.    ED Prescriptions    Medication Sig Dispense Auth. Provider   meloxicam (MOBIC) 15 MG tablet 1 tablet every day with food for 1-2 weeks for knee inflammation, then use as needed 30 tablet Young, Michelle G, PA-C   HYDROcodone-acetaminophen (NORCO/VICODIN) 5-325 MG tablet Take 1 tablet by mouth every 6 (six) hours as needed for up to 5 days for moderate pain. 10 tablet Bjorn Pippin, [place]     I have reviewed the PDMP during this encounter.   Bjorn Pippin, PA-C 02/14/19 1241

## 2019-02-18 DIAGNOSIS — H25041 Posterior subcapsular polar age-related cataract, right eye: Secondary | ICD-10-CM | POA: Diagnosis not present

## 2019-02-18 DIAGNOSIS — H2511 Age-related nuclear cataract, right eye: Secondary | ICD-10-CM | POA: Diagnosis not present

## 2019-03-04 DIAGNOSIS — D225 Melanocytic nevi of trunk: Secondary | ICD-10-CM | POA: Diagnosis not present

## 2019-03-04 DIAGNOSIS — L218 Other seborrheic dermatitis: Secondary | ICD-10-CM | POA: Diagnosis not present

## 2019-03-04 DIAGNOSIS — L308 Other specified dermatitis: Secondary | ICD-10-CM | POA: Diagnosis not present

## 2019-03-09 ENCOUNTER — Telehealth: Payer: Self-pay

## 2019-03-09 DIAGNOSIS — M810 Age-related osteoporosis without current pathological fracture: Secondary | ICD-10-CM

## 2019-03-09 NOTE — Telephone Encounter (Signed)
Pt. Called asserting she had gotten a letter from Dr. Carlota Raspberry asking if it would be ok to refer her to an enterologist. Pt. Michela Pitcher it would be ok

## 2019-03-10 NOTE — Telephone Encounter (Signed)
Dr. Carlota Raspberry pt is ok with referral.  See patient call note.

## 2019-03-17 NOTE — Telephone Encounter (Signed)
Please advise. See previous note also.

## 2019-03-17 NOTE — Telephone Encounter (Signed)
Patient is requesting PCP to call patient back  Call back [phone]

## 2019-03-17 NOTE — Telephone Encounter (Signed)
Called pt - discussed referral to endocrinology, planning on meeting with Dr. Cruzita Lederer -  Understanding expressed of plan. She also plans to schedule visit to evaluate her knee after the holiday notes lightheadedness at times. Denies chest pains, focal weakness. Plans to schedule visit. ER precautions given.   Please call and schedule her for appointment for knee pain and dizziness.

## 2019-03-18 ENCOUNTER — Telehealth: Payer: Self-pay | Admitting: Family Medicine

## 2019-03-18 NOTE — Telephone Encounter (Signed)
No answer , lvm to schedule appt

## 2019-03-18 NOTE — Telephone Encounter (Signed)
Pt called to refuse appt. She does not want to be called for appts. She will call after the holidays to schedule

## 2019-05-17 ENCOUNTER — Ambulatory Visit: Payer: PPO | Attending: Internal Medicine

## 2019-05-17 DIAGNOSIS — Z23 Encounter for immunization: Secondary | ICD-10-CM | POA: Insufficient documentation

## 2019-05-17 NOTE — Progress Notes (Signed)
   Covid-19 Vaccination Clinic  Name:  Jessica Stephens    MRN: [id number] DOB: [birth year]  05/17/2019  [name] was observed post Covid-19 immunization for 15 minutes without incidence. She was provided with Vaccine Information Sheet and instruction to access the V-Safe system.   [name] was instructed to call 911 with any severe reactions post vaccine: Marland Kitchen Difficulty breathing  . Swelling of your face and throat  . A fast heartbeat  . A bad rash all over your body  . Dizziness and weakness    Immunizations Administered    Name Date Dose VIS Date Route   Pfizer COVID-19 Vaccine 05/17/2019 11:48 AM 0.3 mL 03/13/2019 Intramuscular   Manufacturer: [place]   Lot: [id number]   [place]: [id number]

## 2019-06-09 ENCOUNTER — Ambulatory Visit: Payer: PPO | Attending: Internal Medicine

## 2019-06-09 DIAGNOSIS — Z23 Encounter for immunization: Secondary | ICD-10-CM | POA: Insufficient documentation

## 2019-06-09 NOTE — Progress Notes (Signed)
   Covid-19 Vaccination Clinic  Name:  Jessica Stephens    MRN: [id number] DOB: 1950/05/01  06/09/2019  [name] was observed post Covid-19 immunization for 15 minutes without incident. She was provided with Vaccine Information Sheet and instruction to access the V-Safe system.   [name] was instructed to call 911 with any severe reactions post vaccine: Marland Kitchen Difficulty breathing  . Swelling of face and throat  . A fast heartbeat  . A bad rash all over body  . Dizziness and weakness   Immunizations Administered    Name Date Dose VIS Date Route   Pfizer COVID-19 Vaccine 06/09/2019  2:33 PM 0.3 mL 03/13/2019 Intramuscular   Manufacturer: [place]   Lot: [id number]   [place]: [id number]

## 2019-08-04 DIAGNOSIS — H6123 Impacted cerumen, bilateral: Secondary | ICD-10-CM | POA: Insufficient documentation

## 2019-08-04 DIAGNOSIS — H9192 Unspecified hearing loss, left ear: Secondary | ICD-10-CM | POA: Insufficient documentation

## 2019-08-04 DIAGNOSIS — H60543 Acute eczematoid otitis externa, bilateral: Secondary | ICD-10-CM | POA: Diagnosis not present

## 2019-08-04 DIAGNOSIS — H6121 Impacted cerumen, right ear: Secondary | ICD-10-CM | POA: Diagnosis not present

## 2019-08-04 DIAGNOSIS — H6122 Impacted cerumen, left ear: Secondary | ICD-10-CM | POA: Diagnosis not present

## 2019-08-04 DIAGNOSIS — R0981 Nasal congestion: Secondary | ICD-10-CM | POA: Insufficient documentation

## 2019-08-04 DIAGNOSIS — H903 Sensorineural hearing loss, bilateral: Secondary | ICD-10-CM | POA: Insufficient documentation

## 2019-08-04 DIAGNOSIS — H93292 Other abnormal auditory perceptions, left ear: Secondary | ICD-10-CM | POA: Diagnosis not present

## 2019-08-04 DIAGNOSIS — J343 Hypertrophy of nasal turbinates: Secondary | ICD-10-CM | POA: Diagnosis not present

## 2019-11-11 ENCOUNTER — Other Ambulatory Visit: Payer: Self-pay

## 2019-11-11 ENCOUNTER — Encounter (HOSPITAL_COMMUNITY): Payer: Self-pay

## 2019-11-11 ENCOUNTER — Ambulatory Visit (HOSPITAL_COMMUNITY)
Admission: EM | Admit: 2019-11-11 | Discharge: 2019-11-11 | Disposition: A | Discharge status: Home / Self Care | Payer: PPO | Attending: Emergency Medicine | Admitting: Emergency Medicine

## 2019-11-11 DIAGNOSIS — N390 Urinary tract infection, site not specified: Secondary | ICD-10-CM | POA: Diagnosis not present

## 2019-11-11 DIAGNOSIS — R300 Dysuria: Secondary | ICD-10-CM

## 2019-11-11 LAB — POCT URINALYSIS DIPSTICK, ED / UC
Bilirubin Urine: NEGATIVE
Glucose, UA: NEGATIVE mg/dL
Ketones, ur: NEGATIVE mg/dL
Nitrite: NEGATIVE
Protein, ur: NEGATIVE mg/dL
Specific Gravity, Urine: 1.01 (ref 1.005–1.030)
Urobilinogen, UA: 0.2 mg/dL (ref 0.0–1.0)
pH: 6 (ref 5.0–8.0)

## 2019-11-11 MED ORDER — CEPHALEXIN 500 MG PO CAPS: 500.0000 mg | ORAL_CAPSULE | Freq: Two times a day (BID) | ORAL | 0 refills | Status: AC

## 2019-11-11 NOTE — Discharge Instructions (Signed)
Keflex twice daily for 1 week Drink plenty of fluids Follow up if not improving or worsening

## 2019-11-11 NOTE — ED Triage Notes (Signed)
Pt presents with burning sensation when urinating x 2 days.

## 2019-11-11 NOTE — ED Provider Notes (Signed)
[place]    CSN: [id number] Arrival date & time: 11/11/19  1225      History   Chief Complaint Chief Complaint  Patient presents with  . Dysuria    HPI Jessica Stephens is a 69 y.o. female history of hypertension, hyperlipidemia, presenting today for evaluation of dysuria.  Patient reports over the past 2 days she has had burning with urination and lower abdominal/bladder pressure.  She reports concerns for bladder prolapse as well as need for updating her colonoscopy for her ulcerative colitis.  Has had prior UTIs, but has not had one in a while.  Denies fever, nausea or vomiting.  Has had decreased appetite.  HPI  Past Medical History:  Diagnosis Date  . Abnormal transaminases   . Anxiety   . Arthritis   . Esophageal stricture   . GERD (gastroesophageal reflux disease)   . Hiatal hernia   . Hyperlipidemia   . Hypertension   . Maxillary sinusitis   . Proctitis   . Ulcerative colitis ([place])    left sided  . Vitamin B12 deficiency     Patient Active Problem List   Diagnosis Date Noted  . Lower urinary tract symptoms (LUTS) 12/05/2016  . Acute UTI 12/05/2016  . Flank pain, acute 12/05/2016  . Prolonged Q-T interval on ECG 08/21/2014  . Lobar pneumonia due to unspecified organism 08/21/2014  . CAP (community acquired pneumonia) 08/19/2014  . Community acquired pneumonia 08/19/2014  . Sepsis ([place]) 08/19/2014  . Hypokalemia 08/19/2014  . HYPERLIPIDEMIA 05/29/2010  . DYSPNEA 05/29/2010  . CHEST PAIN, ATYPICAL 05/02/2010  . ESOPHAGEAL STRICTURE 04/07/2010  . HIATAL HERNIA 04/07/2010  . ULCERATIVE PROCTITIS 04/07/2010  . ARTHRITIS 04/07/2010  . LUQ PAIN 04/07/2010  . TRANSAMINASES, SERUM, ELEVATED 04/07/2010  . VITAMIN B12 DEFICIENCY 01/30/2008  . ANXIETY, CHRONIC 01/29/2008  . Essential hypertension 01/29/2008  . CHRONIC MAXILLARY SINUSITIS 04/24/2007  . G E R D 04/24/2007  . UNSPECIFIED OSTEOPOROSIS 04/24/2007  . ULCERATIVE COLITIS, LEFT SIDED  02/25/2007    Past Surgical History:  Procedure Laterality Date  . CATARACT EXTRACTION    . CESAREAN SECTION    . nose surg    . NOSE SURGERY      OB History   No obstetric history on file.      Home Medications    Prior to Admission medications   Medication Sig Start Date End Date Taking? Authorizing Provider  Ergocalciferol (VITAMIN D2) 50 MCG (2000 UT) TABS Take by mouth.   Yes [provider]  amLODipine (NORVASC) 10 MG tablet TAKE 1 TABLET BY MOUTH EVERY DAY FOR BLOOD PRESSURE 12/22/18   [name], MD  atorvastatin (LIPITOR) 20 MG tablet Take 1 tablet (20 mg total) by mouth daily. 12/22/18   [name], MD  calcium-vitamin D (OSCAL WITH D) 500-200 MG-UNIT tablet Take 1 tablet by mouth.    [provider]  cephALEXin (KEFLEX) 500 MG capsule Take 1 capsule (500 mg total) by mouth 2 (two) times daily for 7 days. 11/11/19 11/18/19  Jameila Keeny C, PA-C  cetirizine (ZYRTEC ALLERGY) 10 MG tablet Take 1 tablet (10 mg total) by mouth daily. 01/27/14   Robyn Haber, MD  esomeprazole (NEXIUM) 20 MG capsule Take 20 mg by mouth daily as needed (indigestion).     [provider]  fluticasone (FLONASE) 50 MCG/ACT nasal spray Place 2 sprays into both nostrils daily. 12/22/18   [name], MD  meloxicam (MOBIC) 15 MG tablet 1 tablet  every day with food for 1-2 weeks for knee inflammation, then use as needed 02/14/19   Bjorn Pippin, PA-C  mesalamine (LIALDA) 1.2 g EC tablet Take 1-2 tablets (1.2-2.4 g total) by mouth 2 (two) times daily. 12/22/18   [name], MD  Multiple Vitamins-Minerals (VISION VITAMINS) TABS Take 1 tablet by mouth daily.    [provider]  olmesartan (BENICAR) 20 MG tablet Take 1 tablet (20 mg total) by mouth daily. 12/22/18   [name], MD    Family History Family History  Problem Relation Age of Onset  . Arthritis Mother   . Dementia Mother   . Diabetes Father   . Hyperlipidemia Father    . Hypertension Father     Social History Social History   Tobacco Use  . Smoking status: Never Smoker  . Smokeless tobacco: Never Used  Vaping Use  . Vaping Use: Never used  Substance Use Topics  . Alcohol use: Yes    Comment: occasionally  . Drug use: No     Allergies   Alendronate sodium, Other, and Sulfonamide derivatives   Review of Systems Review of Systems  Constitutional: Negative for fever.  Respiratory: Negative for shortness of breath.   Cardiovascular: Negative for chest pain.  Gastrointestinal: Positive for abdominal pain. Negative for diarrhea, nausea and vomiting.  Genitourinary: Positive for dysuria. Negative for flank pain, genital sores, hematuria, menstrual problem, vaginal bleeding, vaginal discharge and vaginal pain.  Musculoskeletal: Negative for back pain.  Skin: Negative for rash.  Neurological: Negative for dizziness, light-headedness and headaches.     Physical Exam Triage Vital Signs ED Triage Vitals  Enc Vitals Group     BP 11/11/19 1304 (!) 144/67     Pulse Rate 11/11/19 1304 78     Resp 11/11/19 1304 18     Temp 11/11/19 1304 98.5 F (36.9 C)     Temp Source 11/11/19 1304 Oral     SpO2 11/11/19 1304 97 %     Weight --      Height --      Head Circumference --      Peak Flow --      Pain Score 11/11/19 1301 0     Pain Loc --      Pain Edu? --      Excl. in [place]? --    No data found.  Updated Vital Signs BP (!) 144/67 (BP Location: Right Arm)   Pulse 78   Temp 98.5 F (36.9 C) (Oral)   Resp 18   SpO2 97%   Visual Acuity Right Eye Distance:   Left Eye Distance:   Bilateral Distance:    Right Eye Near:   Left Eye Near:    Bilateral Near:     Physical Exam Vitals and nursing note reviewed.  Constitutional:      Appearance: She is well-developed.     Comments: No acute distress  HENT:     Head: Normocephalic and atraumatic.     Nose: Nose normal.  Eyes:     Conjunctiva/sclera: Conjunctivae normal.    Cardiovascular:     Rate and Rhythm: Normal rate.  Pulmonary:     Effort: Pulmonary effort is normal. No respiratory distress.     Comments: Breathing comfortably at rest, CTABL, no wheezing, rales or other adventitious sounds auscultated Abdominal:     General: There is no distension.     Comments: Soft, nondistended, tender to palpation suprapubic area  Musculoskeletal:  General: Normal range of motion.     Cervical back: Neck supple.  Skin:    General: Skin is warm and dry.  Neurological:     Mental Status: She is alert and oriented to person, place, and time.      UC Treatments / Results  Labs (all labs ordered are listed, but only abnormal results are displayed) Labs Reviewed  POCT URINALYSIS DIPSTICK, ED / UC - Abnormal; Notable for the following components:      Result Value   Hgb urine dipstick TRACE (*)    Leukocytes,Ua TRACE (*)    All other components within normal limits  URINE CULTURE    EKG   Radiology No results found.  Procedures Procedures (including critical care time)  Medications Ordered in UC Medications - No data to display  Initial Impression / Assessment and Plan / UC Course  I have reviewed the triage vital signs and the nursing notes.  Pertinent labs & imaging results that were available during my care of the patient were reviewed by me and considered in my medical decision making (see chart for details).     Trace leuks, trace hemoglobin, will send for urine culture.  Given prior UTIs and symptoms empirically treating for UTI with Keflex twice daily x1 week.  Push fluids.  Will call with results of culture and alter therapy as needed.  Discussed strict return precautions. Patient verbalized understanding and is agreeable with plan.  Final Clinical Impressions(s) / UC Diagnoses   Final diagnoses:  Lower urinary tract infectious disease     Discharge Instructions     Keflex twice daily for 1 week Drink plenty of  fluids Follow up if not improving or worsening    ED Prescriptions    Medication Sig Dispense Auth. Provider   cephALEXin (KEFLEX) 500 MG capsule Take 1 capsule (500 mg total) by mouth 2 (two) times daily for 7 days. 14 capsule Drayke Grabel, [place] C, PA-C     PDMP not reviewed this encounter.   Janith Lima, PA-C 11/11/19 1418

## 2019-11-12 LAB — URINE CULTURE: Culture: 40000 — AB

## 2019-11-17 DIAGNOSIS — Z1211 Encounter for screening for malignant neoplasm of colon: Secondary | ICD-10-CM | POA: Diagnosis not present

## 2019-11-17 DIAGNOSIS — E669 Obesity, unspecified: Secondary | ICD-10-CM | POA: Diagnosis not present

## 2019-11-17 DIAGNOSIS — K219 Gastro-esophageal reflux disease without esophagitis: Secondary | ICD-10-CM | POA: Diagnosis not present

## 2019-11-17 DIAGNOSIS — K5100 Ulcerative (chronic) pancolitis without complications: Secondary | ICD-10-CM | POA: Diagnosis not present

## 2019-11-17 DIAGNOSIS — Z86010 Personal history of colonic polyps: Secondary | ICD-10-CM | POA: Diagnosis not present

## 2019-11-26 ENCOUNTER — Other Ambulatory Visit: Payer: Self-pay

## 2019-11-26 ENCOUNTER — Telehealth: Payer: Self-pay | Admitting: Family Medicine

## 2019-11-26 DIAGNOSIS — I10 Essential (primary) hypertension: Secondary | ICD-10-CM

## 2019-11-26 DIAGNOSIS — K51919 Ulcerative colitis, unspecified with unspecified complications: Secondary | ICD-10-CM

## 2019-11-26 DIAGNOSIS — E785 Hyperlipidemia, unspecified: Secondary | ICD-10-CM

## 2019-11-26 MED ORDER — AMLODIPINE BESYLATE 10 MG PO TABS: ORAL_TABLET | ORAL | 0 refills | Status: DC

## 2019-11-26 MED ORDER — ATORVASTATIN CALCIUM 20 MG PO TABS: 20.0000 mg | ORAL_TABLET | Freq: Every day | ORAL | 0 refills | Status: DC

## 2019-11-26 MED ORDER — MESALAMINE 1.2 G PO TBEC
1.2000 g | DELAYED_RELEASE_TABLET | Freq: Two times a day (BID) | ORAL | 0 refills | Status: DC
Start: 2019-11-26 — End: 2020-12-23

## 2019-11-26 MED ORDER — OLMESARTAN MEDOXOMIL 20 MG PO TABS: 20.0000 mg | ORAL_TABLET | Freq: Every day | ORAL | 0 refills | Status: DC

## 2019-11-26 NOTE — Telephone Encounter (Signed)
Patient is wanting med refills before her physical scheduled 02/29/2020 will she have a problem ? Patient will be out of some meds before September and wants to request courtesy refills    Please advise

## 2019-11-26 NOTE — Telephone Encounter (Signed)
LM asking what meds she needs

## 2019-11-27 DIAGNOSIS — K635 Polyp of colon: Secondary | ICD-10-CM | POA: Diagnosis not present

## 2019-11-27 DIAGNOSIS — Z1211 Encounter for screening for malignant neoplasm of colon: Secondary | ICD-10-CM | POA: Diagnosis not present

## 2019-11-27 DIAGNOSIS — D120 Benign neoplasm of cecum: Secondary | ICD-10-CM | POA: Diagnosis not present

## 2019-11-27 DIAGNOSIS — K5190 Ulcerative colitis, unspecified, without complications: Secondary | ICD-10-CM | POA: Diagnosis not present

## 2019-11-27 LAB — HM COLONOSCOPY

## 2020-01-05 ENCOUNTER — Other Ambulatory Visit: Payer: Self-pay | Admitting: Family Medicine

## 2020-01-05 DIAGNOSIS — Z1231 Encounter for screening mammogram for malignant neoplasm of breast: Secondary | ICD-10-CM

## 2020-01-12 DIAGNOSIS — M25562 Pain in left knee: Secondary | ICD-10-CM | POA: Diagnosis not present

## 2020-01-13 ENCOUNTER — Telehealth: Payer: Self-pay | Admitting: Family Medicine

## 2020-01-13 NOTE — Telephone Encounter (Signed)
Pt called for referral for her knee pain Please Advice

## 2020-01-14 NOTE — Telephone Encounter (Signed)
Left message in voice mail to call back concerning referral for knee pain. Called to find out it is both knees or R/L knee.

## 2020-01-14 NOTE — Telephone Encounter (Signed)
Patient have not been seen here since 12/22/18. Patient need to be seen in order to have a referral placed. Patient stated she was seen by [facility] on 01/12/20 and they recommended patient to follow Dr Vonna Kotyk advise and see a specialist. Patient need a new referral. Patient has an up coming appt on 01/18/20

## 2020-01-14 NOTE — Telephone Encounter (Signed)
Pt missed a call from office and was thinking it was a response from this request  Telephone Note  Pt needs referral please advise  Pt request a call back

## 2020-01-16 ENCOUNTER — Other Ambulatory Visit: Payer: Self-pay

## 2020-01-16 ENCOUNTER — Ambulatory Visit: Payer: PPO | Attending: Internal Medicine

## 2020-01-16 DIAGNOSIS — Z23 Encounter for immunization: Secondary | ICD-10-CM

## 2020-01-16 NOTE — Progress Notes (Signed)
   Covid-19 Vaccination Clinic  Name:  Jessica Stephens    MRN: [id number] DOB: Jan 31, 1951  01/16/2020  [name] was observed post Covid-19 immunization for 15 minutes without incident. She was provided with Vaccine Information Sheet and instruction to access the V-Safe system.   [name] was instructed to call 911 with any severe reactions post vaccine: Marland Kitchen Difficulty breathing  . Swelling of face and throat  . A fast heartbeat  . A bad rash all over body  . Dizziness and weakness

## 2020-01-18 ENCOUNTER — Telehealth (INDEPENDENT_AMBULATORY_CARE_PROVIDER_SITE_OTHER): Payer: PPO | Admitting: Family Medicine

## 2020-01-18 ENCOUNTER — Encounter: Payer: Self-pay | Admitting: Family Medicine

## 2020-01-18 ENCOUNTER — Other Ambulatory Visit: Payer: Self-pay

## 2020-01-18 DIAGNOSIS — M25562 Pain in left knee: Secondary | ICD-10-CM | POA: Diagnosis not present

## 2020-01-18 DIAGNOSIS — M810 Age-related osteoporosis without current pathological fracture: Secondary | ICD-10-CM

## 2020-01-18 NOTE — Progress Notes (Signed)
Virtual Visit via Telephone Note  I connected with Jessica Stephens on 01/18/20 at 5:40 PM by telephone and verified that I am speaking with the correct person using two identifiers. Patient location:home  My location: office   I discussed the limitations, risks, security and privacy concerns of performing an evaluation and management service by telephone and the availability of in person appointments. I also discussed with the patient that there may be a patient responsible charge related to this service. The patient expressed understanding and agreed to proceed, consent obtained.   Chief complaint: Chief Complaint  Patient presents with  . Knee Pain    PT is havinf pain in her L knee. pain started 2 months ago. 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 pt would like a referral. Pt dosen't like pain medication.    History of Present Illness: Jessica Stephens is a 69 y.o. female  L Knee pain: Pain in left knee past few months.  More work - 8-9 hour shifts. More walking.  Initially pain on side of knee, then moved to L hip and into L foot in last month.  Has been evaluated by ortho  - Dr. Tonita Cong on 10/12. Told may have some arthritis, swollen. Recommended injection, and activity modification. Had cortisone injection and 5 days of prednisone. Off work since 10/12 - ice, rest. Returns to work in 2 days.  Feels better off knee, but concerned that return to full work may get back to where she was before. Concerned about taking meds with ulcerative colitis.  Has knee stabilizer he has been using.  Has appt with Dr. Tonita Cong on 10/26.  Foot and hip pain better.   Referred to endocrinology in 03/2019 for osteoporosis - did not see them as unaware of reason   (we did discuss this on phone 03/17/19).    Patient Active Problem List   Diagnosis Date Noted  . Lower urinary tract symptoms (LUTS) 12/05/2016  . Acute UTI 12/05/2016  . Flank pain, acute 12/05/2016  . Prolonged Q-T  interval on ECG 08/21/2014  . Lobar pneumonia due to unspecified organism 08/21/2014  . CAP (community acquired pneumonia) 08/19/2014  . Community acquired pneumonia 08/19/2014  . Sepsis ([place]) 08/19/2014  . Hypokalemia 08/19/2014  . HYPERLIPIDEMIA 05/29/2010  . DYSPNEA 05/29/2010  . CHEST PAIN, ATYPICAL 05/02/2010  . ESOPHAGEAL STRICTURE 04/07/2010  . HIATAL HERNIA 04/07/2010  . ULCERATIVE PROCTITIS 04/07/2010  . ARTHRITIS 04/07/2010  . LUQ PAIN 04/07/2010  . TRANSAMINASES, SERUM, ELEVATED 04/07/2010  . VITAMIN B12 DEFICIENCY 01/30/2008  . ANXIETY, CHRONIC 01/29/2008  . Essential hypertension 01/29/2008  . CHRONIC MAXILLARY SINUSITIS 04/24/2007  . G E R D 04/24/2007  . UNSPECIFIED OSTEOPOROSIS 04/24/2007  . ULCERATIVE COLITIS, LEFT SIDED 02/25/2007   Past Medical History:  Diagnosis Date  . Abnormal transaminases   . Anxiety   . Arthritis   . Esophageal stricture   . GERD (gastroesophageal reflux disease)   . Hiatal hernia   . Hyperlipidemia   . Hypertension   . Maxillary sinusitis   . Proctitis   . Ulcerative colitis ([place])    left sided  . Vitamin B12 deficiency    Past Surgical History:  Procedure Laterality Date  . CATARACT EXTRACTION    . CESAREAN SECTION    . nose surg    . NOSE SURGERY     Allergies  Allergen Reactions  . Alendronate Sodium   . Other Other (See Comments)    "  messes up blood"   . Sulfonamide Derivatives    Prior to Admission medications   Medication Sig Start Date End Date Taking? Authorizing Provider  amLODipine (NORVASC) 10 MG tablet TAKE 1 TABLET BY MOUTH EVERY DAY FOR BLOOD PRESSURE 11/26/19  Yes [name], MD  atorvastatin (LIPITOR) 20 MG tablet Take 1 tablet (20 mg total) by mouth daily. 11/26/19  Yes [name], MD  calcium-vitamin D (OSCAL WITH D) 500-200 MG-UNIT tablet Take 1 tablet by mouth.   Yes [provider]  cetirizine (ZYRTEC ALLERGY) 10 MG tablet Take 1 tablet (10 mg total) by mouth daily.  01/27/14  Yes Robyn Haber, MD  Ergocalciferol (VITAMIN D2) 50 MCG (2000 UT) TABS Take by mouth.   Yes [provider]  esomeprazole (NEXIUM) 20 MG capsule Take 20 mg by mouth daily as needed (indigestion).    Yes [provider]  fluticasone (FLONASE) 50 MCG/ACT nasal spray Place 2 sprays into both nostrils daily. 12/22/18  Yes [name], MD  meloxicam (MOBIC) 15 MG tablet 1 tablet every day with food for 1-2 weeks for knee inflammation, then use as needed 02/14/19  Yes Young, Vanessa [place], PA-C  mesalamine (LIALDA) 1.2 g EC tablet Take 1-2 tablets (1.2-2.4 g total) by mouth 2 (two) times daily. 11/26/19  Yes [name], MD  Multiple Vitamins-Minerals (VISION VITAMINS) TABS Take 1 tablet by mouth daily.   Yes [provider]  olmesartan (BENICAR) 20 MG tablet Take 1 tablet (20 mg total) by mouth daily. 11/26/19  Yes [name], MD   Social History   Socioeconomic History  . Marital status: Married    Spouse name: Not on file  . Number of children: Not on file  . Years of education: Not on file  . Highest education level: Not on file  Occupational History  . Not on file  Tobacco Use  . Smoking status: Never Smoker  . Smokeless tobacco: Never Used  Vaping Use  . Vaping Use: Never used  Substance and Sexual Activity  . Alcohol use: Yes    Comment: occasionally  . Drug use: No  . Sexual activity: Not on file  Other Topics Concern  . Not on file  Social History Narrative  . Not on file   Social Determinants of Health   Financial Resource Strain:   . Difficulty of Paying Living Expenses: Not on file  Food Insecurity:   . Worried About Charity fundraiser in the Last Year: Not on file  . Ran Out of Food in the Last Year: Not on file  Transportation Needs:   . Lack of Transportation (Medical): Not on file  . Lack of Transportation (Non-Medical): Not on file  Physical Activity:   . Days of Exercise per Week: Not on file  . Minutes  of Exercise per Session: Not on file  Stress:   . Feeling of Stress : Not on file  Social Connections:   . Frequency of Communication with Friends and Family: Not on file  . Frequency of Social Gatherings with Friends and Family: Not on file  . Attends Religious Services: Not on file  . Active Member of Clubs or Organizations: Not on file  . Attends Archivist Meetings: Not on file  . Marital Status: Not on file  Intimate Partner Violence:   . Fear of Current or Ex-Partner: Not on file  . Emotionally Abused: Not on file  . Physically Abused: Not on file  .  Sexually Abused: Not on file     Observations/Objective:   Assessment and Plan: Osteoporosis without current pathological fracture, unspecified osteoporosis type - Plan: Ambulatory referral to Endocrinology  -Discussed osteoporosis again and reason for referral last December, she expressed understanding of this discussion today and does agree to referral to endocrinology to evaluate for treatment options.  Left knee pain, unspecified chronicity  -As above, status post treatment by orthopedics including with injection, oral steroids by her description (orthopedic notes not available at time of visit), and has home brace.  Has had some improvement but recommended she discuss any concerns with return to work with her orthopedist, or she does return to work and has some difficulty can also discuss that with her treating orthopedist.  Option of in office evaluation and exam if she would like other opinion.  Also recommended evaluation of hip or foot pain returns.  Follow Up Instructions: As above.    I discussed the assessment and treatment plan with the patient. The patient was provided an opportunity to ask questions and all were answered. The patient agreed with the plan and demonstrated an understanding of the instructions.   The patient was advised to call back or seek an in-person evaluation if the symptoms worsen or if  the condition fails to improve as anticipated.  I provided 21 minutes of non-face-to-face time during this encounter.  Signed,   Merri Ray, MD [facility].  01/18/20

## 2020-02-24 DIAGNOSIS — N813 Complete uterovaginal prolapse: Secondary | ICD-10-CM | POA: Diagnosis not present

## 2020-02-24 DIAGNOSIS — N8110 Cystocele, unspecified: Secondary | ICD-10-CM | POA: Diagnosis not present

## 2020-02-24 DIAGNOSIS — R350 Frequency of micturition: Secondary | ICD-10-CM | POA: Diagnosis not present

## 2020-02-24 DIAGNOSIS — N8189 Other female genital prolapse: Secondary | ICD-10-CM | POA: Diagnosis not present

## 2020-02-24 DIAGNOSIS — R351 Nocturia: Secondary | ICD-10-CM | POA: Diagnosis not present

## 2020-02-24 DIAGNOSIS — N952 Postmenopausal atrophic vaginitis: Secondary | ICD-10-CM | POA: Diagnosis not present

## 2020-02-24 DIAGNOSIS — N8111 Cystocele, midline: Secondary | ICD-10-CM | POA: Diagnosis not present

## 2020-02-29 ENCOUNTER — Encounter: Payer: Self-pay | Admitting: Family Medicine

## 2020-02-29 ENCOUNTER — Ambulatory Visit (INDEPENDENT_AMBULATORY_CARE_PROVIDER_SITE_OTHER): Payer: PPO | Admitting: Family Medicine

## 2020-02-29 ENCOUNTER — Other Ambulatory Visit: Payer: Self-pay

## 2020-02-29 VITALS — BP 130/80 | HR 72 | Temp 98.7°F | Ht 60.0 in | Wt 158.0 lb

## 2020-02-29 DIAGNOSIS — E785 Hyperlipidemia, unspecified: Secondary | ICD-10-CM

## 2020-02-29 DIAGNOSIS — H938X2 Other specified disorders of left ear: Secondary | ICD-10-CM

## 2020-02-29 DIAGNOSIS — Z0001 Encounter for general adult medical examination with abnormal findings: Secondary | ICD-10-CM | POA: Diagnosis not present

## 2020-02-29 DIAGNOSIS — R0981 Nasal congestion: Secondary | ICD-10-CM | POA: Diagnosis not present

## 2020-02-29 DIAGNOSIS — I10 Essential (primary) hypertension: Secondary | ICD-10-CM

## 2020-02-29 DIAGNOSIS — H6122 Impacted cerumen, left ear: Secondary | ICD-10-CM | POA: Diagnosis not present

## 2020-02-29 DIAGNOSIS — Z0000 Encounter for general adult medical examination without abnormal findings: Secondary | ICD-10-CM

## 2020-02-29 DIAGNOSIS — Z9109 Other allergy status, other than to drugs and biological substances: Secondary | ICD-10-CM | POA: Diagnosis not present

## 2020-02-29 MED ORDER — OLMESARTAN MEDOXOMIL 20 MG PO TABS: 20.0000 mg | ORAL_TABLET | Freq: Every day | ORAL | 1 refills | Status: DC

## 2020-02-29 MED ORDER — AMLODIPINE BESYLATE 10 MG PO TABS: ORAL_TABLET | ORAL | 1 refills | Status: DC

## 2020-02-29 MED ORDER — CETIRIZINE HCL 10 MG PO TABS: 10.0000 mg | ORAL_TABLET | Freq: Every day | ORAL | 3 refills | Status: DC

## 2020-02-29 MED ORDER — ATORVASTATIN CALCIUM 20 MG PO TABS: 20.0000 mg | ORAL_TABLET | Freq: Every day | ORAL | 1 refills | Status: DC

## 2020-02-29 MED ORDER — FLUTICASONE PROPIONATE 50 MCG/ACT NA SUSP: 2.0000 | Freq: Every day | NASAL | 6 refills | Status: DC

## 2020-02-29 NOTE — Patient Instructions (Addendum)
Small amount of lotion on the external ear can help with the dry skin. Can try Tylenol PM at bedtime if needed for sleep, but watch out for sleepiness the next morning, dizziness or lightheadedness.  Follow-up if you are continuing to have difficulty getting to sleep after work. Continue follow-up with urologist. If your symptoms are not improving, I do recommend follow-up with Dr. Redmond Baseman. Schedule mammogram for early next year. We can check with urologist office regarding Pap test, but if that was not performed you are due and I can perform that here or refer you to OB/GYN if you would prefer. Keep follow-up with endocrinology for the osteoporosis and treatment options. Initial memory screening test were abnormal.  Let me know if you would be willing to meet with memory specialist to do further testing.  Let me know if you have questions and thank you for coming in today.  Return to the clinic or go to the nearest emergency room if any of your symptoms worsen or new symptoms occur.  Health Maintenance After Age 33 After age 58, you are at a higher risk for certain long-term diseases and infections as well as injuries from falls. Falls are a major cause of broken bones and head injuries in people who are older than age 9. Getting regular preventive care can help to keep you healthy and well. Preventive care includes getting regular testing and making lifestyle changes as recommended by your health care provider. Talk with your health care provider about:  Which screenings and tests you should have. A screening is a test that checks for a disease when you have no symptoms.  A diet and exercise plan that is right for you. What should I know about screenings and tests to prevent falls? Screening and testing are the best ways to find a health problem early. Early diagnosis and treatment give you the best chance of managing medical conditions that are common after age 17. Certain conditions and  lifestyle choices may make you more likely to have a fall. Your health care provider may recommend:  Regular vision checks. Poor vision and conditions such as cataracts can make you more likely to have a fall. If you wear glasses, make sure to get your prescription updated if your vision changes.  Medicine review. Work with your health care provider to regularly review all of the medicines you are taking, including over-the-counter medicines. Ask your health care provider about any side effects that may make you more likely to have a fall. Tell your health care provider if any medicines that you take make you feel dizzy or sleepy.  Osteoporosis screening. Osteoporosis is a condition that causes the bones to get weaker. This can make the bones weak and cause them to break more easily.  Blood pressure screening. Blood pressure changes and medicines to control blood pressure can make you feel dizzy.  Strength and balance checks. Your health care provider may recommend certain tests to check your strength and balance while standing, walking, or changing positions.  Foot health exam. Foot pain and numbness, as well as not wearing proper footwear, can make you more likely to have a fall.  Depression screening. You may be more likely to have a fall if you have a fear of falling, feel emotionally low, or feel unable to do activities that you used to do.  Alcohol use screening. Using too much alcohol can affect your balance and may make you more likely to have a fall. What actions  can I take to lower my risk of falls? General instructions  Talk with your health care provider about your risks for falling. Tell your health care provider if: ? You fall. Be sure to tell your health care provider about all falls, even ones that seem minor. ? You feel dizzy, sleepy, or off-balance.  Take over-the-counter and prescription medicines only as told by your health care provider. These include any  supplements.  Eat a healthy diet and maintain a healthy weight. A healthy diet includes low-fat dairy products, low-fat (lean) meats, and fiber from whole grains, beans, and lots of fruits and vegetables. Home safety  Remove any tripping hazards, such as rugs, cords, and clutter.  Install safety equipment such as grab bars in bathrooms and safety rails on stairs.  Keep rooms and walkways well-lit. Activity   Follow a regular exercise program to stay fit. This will help you maintain your balance. Ask your health care provider what types of exercise are appropriate for you.  If you need a cane or walker, use it as recommended by your health care provider.  Wear supportive shoes that have nonskid soles. Lifestyle  Do not drink alcohol if your health care provider tells you not to drink.  If you drink alcohol, limit how much you have: ? 0-1 drink a day for women. ? 0-2 drinks a day for men.  Be aware of how much alcohol is in your drink. In the [place] one drink equals one typical bottle of beer (12 oz), one-half glass of wine (5 oz), or one shot of hard liquor (1 oz).  Do not use any products that contain nicotine or tobacco, such as cigarettes and e-cigarettes. If you need help quitting, ask your health care provider. Summary  Having a healthy lifestyle and getting preventive care can help to protect your health and wellness after age 60.  Screening and testing are the best way to find a health problem early and help you avoid having a fall. Early diagnosis and treatment give you the best chance for managing medical conditions that are more common for people who are older than age 70.  Falls are a major cause of broken bones and head injuries in people who are older than age 51. Take precautions to prevent a fall at home.  Work with your health care provider to learn what changes you can make to improve your health and wellness and to prevent falls. This information is not intended  to replace advice given to you by your health care provider. Make sure you discuss any questions you have with your health care provider. Document Revised: 07/10/2018 Document Reviewed: 01/30/2017 Elsevier Patient Education  [employer].      If you have lab work done today you will be contacted with your lab results within the next 2 weeks.  If you have not heard from [place] then please contact us. The fastest way to get your results is to register for My Chart.   IF you received an x-ray today, you will receive an invoice from [facility]. Please contact [facility] at [phone] with questions or concerns regarding your invoice.   IF you received labwork today, you will receive an invoice from [facility]. Please contact [facility] at [phone] with questions or concerns regarding your invoice.   Our billing staff will not be able to assist you with questions regarding bills from these companies.  You will be contacted with the lab results as soon as they are  available. The fastest way to get your results is to activate your My Chart account. Instructions are located on the last page of this paperwork. If you have not heard from [place] regarding the results in 2 weeks, please contact this office.

## 2020-02-29 NOTE — Progress Notes (Signed)
Subjective:  Patient ID: Jessica Stephens, female    DOB: Aug 02, 1950  Age: 69 y.o. MRN: [id number]  CC:  Chief Complaint  Patient presents with  . Medicare Wellness    pt reports that she has seem a urologist and states she was told her urertus has dropped, and she has an appt to get a [place] on 03/16/2020 and then antoerh appt on 03/30/2020 to discuss treatment. pt states she want a years worth of medication refilled.   HPI ASHLON LOTTMAN presents for  Annual wellness exam Care team: PCP: me ENT: Redmond Baseman.  Gastroenterology, Dr. Collene Mares Ophthalmology, Gershon Crane Urology: Zigmund Daniel Consult November 24, pelvic organ prolapse.  Has ultrasound planned. Plan for SSLF hysteropexy, A&P repair.  January 3.  Allergic rhinitis: Zyrtec, Flonase as needed. Takes daily if drainage.   Hyperlipidemia: Lipitor 20 mg daily. no new side effects.  Lab Results  Component Value Date   CHOL 150 12/22/2018   HDL 57 12/22/2018   LDLCALC 74 12/22/2018   TRIG 106 12/22/2018   CHOLHDL 2.6 12/22/2018   Lab Results  Component Value Date   ALT 28 12/22/2018   AST 27 12/22/2018   ALKPHOS 128 (H) 12/22/2018   BILITOT 0.6 12/22/2018   Ulcerative colitis Gastroenterology, Dr. Collene Mares, has been treated with mesalamine. Taking 1 pill per day of mesalamine- controlling symptoms.  Osteoporosis See previous notes, she was referred to endocrinology in December 2020.  Unaware of reason for referral at that time, we discussed this again October 18 and agreed to endocrinology eval at that time for osteoporosis. appt 03/09/20 with Dr. Kelton Pillar.   Hypertension: Amlodipine 10 mg daily, Benicar 20 mg daily. Home readings:no recent - no new side effects with meds.  BP Readings from Last 3 Encounters:  02/29/20 130/80  11/11/19 (!) 144/67  02/14/19 (!) 156/70   Lab Results  Component Value Date   CREATININE 0.69 12/22/2018   Fall Risk  02/29/2020 01/18/2020 12/22/2018 02/18/2018 01/14/2018  Falls in the past year? 0 0 0  0 No  Number falls in past yr: - - 0 - -  Injury with Fall? - - 0 - -  Follow up Falls evaluation completed Falls evaluation completed Falls evaluation completed - -   Depression screen [facility] 2/9 02/29/2020 01/18/2020 12/22/2018 02/18/2018 01/14/2018  Decreased Interest 0 0 0 0 0  Down, Depressed, Hopeless 0 0 0 0 0  PHQ - 2 Score 0 0 0 0 0   Cancer screening: Colonoscopy 11/27/19.  Mammogram ordered, last performed in 01/05/19.  Delaying due to covid booster.  Pap negative in 12/2016. No HPV testing. She thinks she had Pap at urologist?   Immunization History  Administered Date(s) Administered  . Fluad Quad(high Dose 65+) 12/08/2018  . Influenza Split 01/03/2012  . Influenza Whole 01/30/2008  . Influenza, High Dose Seasonal PF 01/26/2018  . Influenza,inj,Quad PF,6+ Mos 01/22/2013, 01/27/2014, 02/08/2015, 01/20/2016  . Influenza-Unspecified 02/15/2017, 12/28/2019  . PFIZER SARS-COV-2 Vaccination 05/17/2019, 06/09/2019, 01/16/2020  . Pneumococcal Conjugate-13 01/27/2014  . Pneumococcal Polysaccharide-23 12/22/2018  . Tdap 02/08/2015  shingles vaccine: has not had - not sure.   Functional Status Survey: Is the patient deaf or have difficulty hearing?: Yes (in L ear) Does the patient have difficulty seeing, even when wearing glasses/contacts?: No (pt has has eye surgery) Does the patient have difficulty concentrating, remembering, or making decisions?: No Does the patient have difficulty walking or climbing stairs?: Yes Does the patient have difficulty dressing or bathing?: No Does the patient have  difficulty doing errands alone such as visiting a doctor's office or shopping?: No   L ear: seen by ENT in the summer. Cerumen and skin issue? Plan for repeat eval, did not go back. Better for awhile, now decreased hearing again - has not called Dr. Redmond Baseman again. Blocked again recently. Dry skin - applies lotion at times to ear canal.   Knee pain treated by ortho.   6CIT Screen 02/29/2020  12/22/2018  What Year? 0 points 0 points  What month? 3 points 0 points  What time? 3 points 0 points  Count back from 20 2 points 0 points  Months in reverse 0 points 0 points  Repeat phrase 8 points 2 points  Total Score 16 2  denies difficulty with memory or changes recently. Stays up late after working at times. Tired next day.  Melatonin - no relief.  No tylenol pm.  Memory screening repeated with [place] cognitive assessment/MoCA.  Refused the serial 7 subtraction, so 0 out of 3 on that section, also refused the delayed recall, 0 out of 5 for that section.  Total score 18 out of 30.  Maximum score if she had completed those sections would be 26 out of 30.  Declines further testing or evaluation for her memory at this time    Office Visit from 02/29/2020 in [facility] at [facility]  AUDIT-C Score 1     Dental: has dentist - has not scheduled recent visit. Recommended.   Exercise: at work - walking. No exercise outside of work due to knee pain.   Advanced directives:information provided.   History Patient Active Problem List   Diagnosis Date Noted  . Lower urinary tract symptoms (LUTS) 12/05/2016  . Acute UTI 12/05/2016  . Flank pain, acute 12/05/2016  . Prolonged Q-T interval on ECG 08/21/2014  . Lobar pneumonia due to unspecified organism 08/21/2014  . CAP (community acquired pneumonia) 08/19/2014  . Community acquired pneumonia 08/19/2014  . Sepsis ([place]) 08/19/2014  . Hypokalemia 08/19/2014  . HYPERLIPIDEMIA 05/29/2010  . DYSPNEA 05/29/2010  . CHEST PAIN, ATYPICAL 05/02/2010  . ESOPHAGEAL STRICTURE 04/07/2010  . HIATAL HERNIA 04/07/2010  . ULCERATIVE PROCTITIS 04/07/2010  . ARTHRITIS 04/07/2010  . LUQ PAIN 04/07/2010  . TRANSAMINASES, SERUM, ELEVATED 04/07/2010  . VITAMIN B12 DEFICIENCY 01/30/2008  . ANXIETY, CHRONIC 01/29/2008  . Essential hypertension 01/29/2008  . CHRONIC MAXILLARY SINUSITIS 04/24/2007  . G E R D 04/24/2007  . UNSPECIFIED OSTEOPOROSIS  04/24/2007  . ULCERATIVE COLITIS, LEFT SIDED 02/25/2007   Past Medical History:  Diagnosis Date  . Abnormal transaminases   . Anxiety   . Arthritis   . Esophageal stricture   . GERD (gastroesophageal reflux disease)   . Hiatal hernia   . Hyperlipidemia   . Hypertension   . Maxillary sinusitis   . Proctitis   . Ulcerative colitis ([place])    left sided  . Vitamin B12 deficiency    Past Surgical History:  Procedure Laterality Date  . CATARACT EXTRACTION    . CESAREAN SECTION    . nose surg    . NOSE SURGERY     Allergies  Allergen Reactions  . Alendronate Sodium   . Other Other (See Comments)    "messes up blood"   . Sulfonamide Derivatives    Prior to Admission medications   Medication Sig Start Date End Date Taking? Authorizing Provider  amLODipine (NORVASC) 10 MG tablet TAKE 1 TABLET BY MOUTH EVERY DAY FOR BLOOD PRESSURE 11/26/19  Yes [name],  MD  atorvastatin (LIPITOR) 20 MG tablet Take 1 tablet (20 mg total) by mouth daily. 11/26/19  Yes [name], MD  cetirizine (ZYRTEC ALLERGY) 10 MG tablet Take 1 tablet (10 mg total) by mouth daily. 01/27/14  Yes Robyn Haber, MD  Cholecalciferol (VITAMIN D3 SUPER STRENGTH) 50 MCG (2000 UT) CAPS Take by mouth.   Yes [provider]  esomeprazole (NEXIUM) 20 MG capsule Take 20 mg by mouth daily as needed (indigestion).    Yes [provider]  fluticasone (FLONASE) 50 MCG/ACT nasal spray Place 2 sprays into both nostrils daily. 12/22/18  Yes [name], MD  mesalamine (LIALDA) 1.2 g EC tablet Take 1-2 tablets (1.2-2.4 g total) by mouth 2 (two) times daily. 11/26/19  Yes [name], MD  Multiple Vitamins-Minerals (VISION VITAMINS) TABS Take 1 tablet by mouth daily.   Yes [provider]  olmesartan (BENICAR) 20 MG tablet Take 1 tablet (20 mg total) by mouth daily. 11/26/19  Yes [name], MD  calcium-vitamin D (OSCAL WITH D) 500-200 MG-UNIT tablet Take 1 tablet by  mouth. Patient not taking: Reported on 02/29/2020    [provider]  Ergocalciferol (VITAMIN D2) 50 MCG (2000 UT) TABS Take by mouth. Patient not taking: Reported on 02/29/2020    [provider]  meloxicam (MOBIC) 15 MG tablet 1 tablet every day with food for 1-2 weeks for knee inflammation, then use as needed Patient not taking: Reported on 02/29/2020 02/14/19   Bjorn Pippin, PA-C   Social History   Socioeconomic History  . Marital status: Married    Spouse name: Not on file  . Number of children: Not on file  . Years of education: Not on file  . Highest education level: Not on file  Occupational History  . Not on file  Tobacco Use  . Smoking status: Never Smoker  . Smokeless tobacco: Never Used  Vaping Use  . Vaping Use: Never used  Substance and Sexual Activity  . Alcohol use: Yes    Comment: occasionally  . Drug use: No  . Sexual activity: Not on file  Other Topics Concern  . Not on file  Social History Narrative  . Not on file   Social Determinants of Health   Financial Resource Strain:   . Difficulty of Paying Living Expenses: Not on file  Food Insecurity:   . Worried About Charity fundraiser in the Last Year: Not on file  . Ran Out of Food in the Last Year: Not on file  Transportation Needs:   . Lack of Transportation (Medical): Not on file  . Lack of Transportation (Non-Medical): Not on file  Physical Activity:   . Days of Exercise per Week: Not on file  . Minutes of Exercise per Session: Not on file  Stress:   . Feeling of Stress : Not on file  Social Connections:   . Frequency of Communication with Friends and Family: Not on file  . Frequency of Social Gatherings with Friends and Family: Not on file  . Attends Religious Services: Not on file  . Active Member of Clubs or Organizations: Not on file  . Attends Archivist Meetings: Not on file  . Marital Status: Not on file  Intimate Partner Violence:   . Fear of Current  or Ex-Partner: Not on file  . Emotionally Abused: Not on file  . Physically Abused: Not on file  . Sexually Abused: Not on file    Review  of Systems 13 point review of systems per patient health survey noted.  Negative other than as indicated above or in HPI.    Objective:   Vitals:   02/29/20 0831  BP: 130/80  Pulse: 72  Temp: 98.7 F (37.1 C)  TempSrc: Temporal  SpO2: 98%  Weight: 158 lb (71.7 kg)  Height: 5' (1.524 m)     Physical Exam Vitals reviewed.  Constitutional:      Appearance: She is well-developed.  HENT:     Head: Normocephalic and atraumatic.     Ears:     Comments: Dry skin with exterior canals bilaterally without open wounds.  Cerumen mild to moderate on the right, obstructive on left.  Pinna nontender.  No canal edema/erythema.   Cerumen removal with lavage - cleared canal - no wounds, hearing improved - sx's resolved.  Eyes:     Conjunctiva/sclera: Conjunctivae normal.     Pupils: Pupils are equal, round, and reactive to light.  Neck:     Thyroid: No thyromegaly.  Cardiovascular:     Rate and Rhythm: Normal rate and regular rhythm.     Heart sounds: Normal heart sounds. No murmur heard.   Pulmonary:     Effort: Pulmonary effort is normal. No respiratory distress.     Breath sounds: Normal breath sounds. No wheezing.  Abdominal:     General: Bowel sounds are normal.     Palpations: Abdomen is soft.     Tenderness: There is no abdominal tenderness.  Musculoskeletal:        General: No tenderness. Normal range of motion.     Cervical back: Normal range of motion and neck supple.  Lymphadenopathy:     Cervical: No cervical adenopathy.  Skin:    General: Skin is warm and dry.     Findings: No rash.  Neurological:     Mental Status: She is alert and oriented to person, place, and time.  Psychiatric:        Behavior: Behavior normal.        Thought Content: Thought content normal.        Assessment & Plan:  TULLY BURGO is a 69  y.o. female . Medicare annual wellness visit, subsequent  -- anticipatory guidance as below in AVS, screening labs if needed. Health maintenance items as above in HPI discussed/recommended as applicable.  - no concerning responses on depression, fall, or functional status screening. Any positive responses noted as above. Advanced directives discussed as in CHL.   -Abnormal memory screen, but likely in part due to incomplete answers. Denies recent changes in memory or memory concerns, and declined evaluation by memory specialist at this time.  Essential hypertension - Plan: amLODipine (NORVASC) 10 MG tablet, cetirizine (ZYRTEC ALLERGY) 10 MG tablet, olmesartan (BENICAR) 20 MG tablet, Comprehensive metabolic panel  -  Stable, tolerating current regimen. Medications refilled. Labs pending as above.   Environmental allergies - Plan: cetirizine (ZYRTEC ALLERGY) 10 MG tablet Nasal congestion - Plan: fluticasone (FLONASE) 50 MCG/ACT nasal spray  -Continue Flonase, Zyrtec as needed.  Hyperlipidemia, unspecified hyperlipidemia type - Plan: atorvastatin (LIPITOR) 20 MG tablet, Lipid panel, Comprehensive metabolic panel  -Stable with Lipitor, check labs  Blocked ear, left - Plan: Ear wax removal Impacted cerumen of left ear  -Symptoms improved after lavage, no complications, RTC precautions or follow-up with ENT if persistent difficulties. Dry skin and external ear discussed, can use over-the-counter hydrating lotion.  Continue follow-up with endocrinology, urology as above.  Tylenol PM discussed for  sleep with potential side effects and risk discussed. RTC precautions if persistent.  Meds ordered this encounter  Medications  . amLODipine (NORVASC) 10 MG tablet    Sig: TAKE 1 TABLET BY MOUTH EVERY DAY FOR BLOOD PRESSURE    Dispense:  90 tablet    Refill:  1    Refill 12/21/2019  . cetirizine (ZYRTEC ALLERGY) 10 MG tablet    Sig: Take 1 tablet (10 mg total) by mouth daily.    Dispense:  90 tablet     Refill:  3  . fluticasone (FLONASE) 50 MCG/ACT nasal spray    Sig: Place 2 sprays into both nostrils daily.    Dispense:  16 g    Refill:  6  . atorvastatin (LIPITOR) 20 MG tablet    Sig: Take 1 tablet (20 mg total) by mouth daily.    Dispense:  90 tablet    Refill:  1    Refill 12/21/2019  . olmesartan (BENICAR) 20 MG tablet    Sig: Take 1 tablet (20 mg total) by mouth daily.    Dispense:  90 tablet    Refill:  1    Refill 12/21/2019   Patient Instructions   Small amount of lotion on the external ear can help with the dry skin. Can try Tylenol PM at bedtime if needed for sleep, but watch out for sleepiness the next morning, dizziness or lightheadedness.  Follow-up if you are continuing to have difficulty getting to sleep after work. Continue follow-up with urologist. If your symptoms are not improving, I do recommend follow-up with Dr. Redmond Baseman. Schedule mammogram for early next year. We can check with urologist office regarding Pap test, but if that was not performed you are due and I can perform that here or refer you to OB/GYN if you would prefer. Keep follow-up with endocrinology for the osteoporosis and treatment options. Initial memory screening test were abnormal.  Let me know if you would be willing to meet with memory specialist to do further testing.  Let me know if you have questions and thank you for coming in today.  Return to the clinic or go to the nearest emergency room if any of your symptoms worsen or new symptoms occur.  Health Maintenance After Age 85 After age 24, you are at a higher risk for certain long-term diseases and infections as well as injuries from falls. Falls are a major cause of broken bones and head injuries in people who are older than age 51. Getting regular preventive care can help to keep you healthy and well. Preventive care includes getting regular testing and making lifestyle changes as recommended by your health care provider. Talk with your  health care provider about:  Which screenings and tests you should have. A screening is a test that checks for a disease when you have no symptoms.  A diet and exercise plan that is right for you. What should I know about screenings and tests to prevent falls? Screening and testing are the best ways to find a health problem early. Early diagnosis and treatment give you the best chance of managing medical conditions that are common after age 51. Certain conditions and lifestyle choices may make you more likely to have a fall. Your health care provider may recommend:  Regular vision checks. Poor vision and conditions such as cataracts can make you more likely to have a fall. If you wear glasses, make sure to get your prescription updated if your vision changes.  Medicine  review. Work with your health care provider to regularly review all of the medicines you are taking, including over-the-counter medicines. Ask your health care provider about any side effects that may make you more likely to have a fall. Tell your health care provider if any medicines that you take make you feel dizzy or sleepy.  Osteoporosis screening. Osteoporosis is a condition that causes the bones to get weaker. This can make the bones weak and cause them to break more easily.  Blood pressure screening. Blood pressure changes and medicines to control blood pressure can make you feel dizzy.  Strength and balance checks. Your health care provider may recommend certain tests to check your strength and balance while standing, walking, or changing positions.  Foot health exam. Foot pain and numbness, as well as not wearing proper footwear, can make you more likely to have a fall.  Depression screening. You may be more likely to have a fall if you have a fear of falling, feel emotionally low, or feel unable to do activities that you used to do.  Alcohol use screening. Using too much alcohol can affect your balance and may make you  more likely to have a fall. What actions can I take to lower my risk of falls? General instructions  Talk with your health care provider about your risks for falling. Tell your health care provider if: ? You fall. Be sure to tell your health care provider about all falls, even ones that seem minor. ? You feel dizzy, sleepy, or off-balance.  Take over-the-counter and prescription medicines only as told by your health care provider. These include any supplements.  Eat a healthy diet and maintain a healthy weight. A healthy diet includes low-fat dairy products, low-fat (lean) meats, and fiber from whole grains, beans, and lots of fruits and vegetables. Home safety  Remove any tripping hazards, such as rugs, cords, and clutter.  Install safety equipment such as grab bars in bathrooms and safety rails on stairs.  Keep rooms and walkways well-lit. Activity   Follow a regular exercise program to stay fit. This will help you maintain your balance. Ask your health care provider what types of exercise are appropriate for you.  If you need a cane or walker, use it as recommended by your health care provider.  Wear supportive shoes that have nonskid soles. Lifestyle  Do not drink alcohol if your health care provider tells you not to drink.  If you drink alcohol, limit how much you have: ? 0-1 drink a day for women. ? 0-2 drinks a day for men.  Be aware of how much alcohol is in your drink. In the [place] one drink equals one typical bottle of beer (12 oz), one-half glass of wine (5 oz), or one shot of hard liquor (1 oz).  Do not use any products that contain nicotine or tobacco, such as cigarettes and e-cigarettes. If you need help quitting, ask your health care provider. Summary  Having a healthy lifestyle and getting preventive care can help to protect your health and wellness after age 83.  Screening and testing are the best way to find a health problem early and help you avoid having  a fall. Early diagnosis and treatment give you the best chance for managing medical conditions that are more common for people who are older than age 30.  Falls are a major cause of broken bones and head injuries in people who are older than age 16. Take precautions to prevent a  fall at home.  Work with your health care provider to learn what changes you can make to improve your health and wellness and to prevent falls. This information is not intended to replace advice given to you by your health care provider. Make sure you discuss any questions you have with your health care provider. Document Revised: 07/10/2018 Document Reviewed: 01/30/2017 Elsevier Patient Education  [employer].      If you have lab work done today you will be contacted with your lab results within the next 2 weeks.  If you have not heard from [place] then please contact us. The fastest way to get your results is to register for My Chart.   IF you received an x-ray today, you will receive an invoice from [facility]. Please contact [facility] at [phone] with questions or concerns regarding your invoice.   IF you received labwork today, you will receive an invoice from [place]. Please contact [facility] at [phone] with questions or concerns regarding your invoice.   Our billing staff will not be able to assist you with questions regarding bills from these companies.  You will be contacted with the lab results as soon as they are available. The fastest way to get your results is to activate your My Chart account. Instructions are located on the last page of this paperwork. If you have not heard from [place] regarding the results in 2 weeks, please contact this office.         Signed, Merri Ray, MD [facility]

## 2020-03-01 ENCOUNTER — Encounter: Payer: Self-pay | Admitting: Family Medicine

## 2020-03-01 LAB — COMPREHENSIVE METABOLIC PANEL
ALT: 26 IU/L (ref 0–32)
AST: 19 IU/L (ref 0–40)
Albumin/Globulin Ratio: 1.6 (ref 1.2–2.2)
Albumin: 4.6 g/dL (ref 3.8–4.8)
Alkaline Phosphatase: 121 IU/L (ref 44–121)
BUN/Creatinine Ratio: 16 (ref 12–28)
BUN: 12 mg/dL (ref 8–27)
Bilirubin Total: 0.5 mg/dL (ref 0.0–1.2)
CO2: 22 mmol/L (ref 20–29)
Calcium: 9.8 mg/dL (ref 8.7–10.3)
Chloride: 104 mmol/L (ref 96–106)
Creatinine, Ser: 0.73 mg/dL (ref 0.57–1.00)
GFR calc Af Amer: 99 mL/min/{1.73_m2} (ref >59)
GFR calc non Af Amer: 85 mL/min/{1.73_m2} (ref >59)
Globulin, Total: 2.8 g/dL (ref 1.5–4.5)
Glucose: 87 mg/dL (ref 65–99)
Potassium: 4 mmol/L (ref 3.5–5.2)
Sodium: 142 mmol/L (ref 134–144)
Total Protein: 7.4 g/dL (ref 6.0–8.5)

## 2020-03-01 LAB — LIPID PANEL
Chol/HDL Ratio: 2.7 ratio (ref 0.0–4.4)
Cholesterol, Total: 170 mg/dL (ref 100–199)
HDL: 62 mg/dL (ref >39)
LDL Chol Calc (NIH): 93 mg/dL (ref 0–99)
Triglycerides: 78 mg/dL (ref 0–149)
VLDL Cholesterol Cal: 15 mg/dL (ref 5–40)

## 2020-03-02 ENCOUNTER — Encounter: Payer: Self-pay | Admitting: Radiology

## 2020-03-08 ENCOUNTER — Encounter: Payer: Self-pay | Admitting: Family Medicine

## 2020-03-08 DIAGNOSIS — N819 Female genital prolapse, unspecified: Secondary | ICD-10-CM | POA: Insufficient documentation

## 2020-03-09 ENCOUNTER — Other Ambulatory Visit: Payer: Self-pay

## 2020-03-09 ENCOUNTER — Encounter: Payer: Self-pay | Admitting: Internal Medicine

## 2020-03-09 ENCOUNTER — Ambulatory Visit (INDEPENDENT_AMBULATORY_CARE_PROVIDER_SITE_OTHER): Payer: PPO | Admitting: Internal Medicine

## 2020-03-09 VITALS — BP 144/98 | HR 76 | Ht 60.0 in | Wt 156.0 lb

## 2020-03-09 DIAGNOSIS — M810 Age-related osteoporosis without current pathological fracture: Secondary | ICD-10-CM | POA: Diagnosis not present

## 2020-03-09 LAB — COMPREHENSIVE METABOLIC PANEL
ALT: 22 U/L (ref 0–35)
AST: 19 U/L (ref 0–37)
Albumin: 4.2 g/dL (ref 3.5–5.2)
Alkaline Phosphatase: 97 U/L (ref 39–117)
BUN: 16 mg/dL (ref 6–23)
CO2: 30 mEq/L (ref 19–32)
Calcium: 9.3 mg/dL (ref 8.4–10.5)
Chloride: 104 mEq/L (ref 96–112)
Creatinine, Ser: 0.78 mg/dL (ref 0.40–1.20)
GFR: 78.64 mL/min (ref >60.00)
Glucose, Bld: 108 mg/dL — ABNORMAL HIGH (ref 70–99)
Potassium: 4.5 mEq/L (ref 3.5–5.1)
Sodium: 140 mEq/L (ref 135–145)
Total Bilirubin: 0.5 mg/dL (ref 0.2–1.2)
Total Protein: 7.3 g/dL (ref 6.0–8.3)

## 2020-03-09 LAB — VITAMIN D 25 HYDROXY (VIT D DEFICIENCY, FRACTURES): VITD: 48.2 ng/mL (ref 30.00–100.00)

## 2020-03-09 LAB — TSH: TSH: 1.29 u[IU]/mL (ref 0.35–4.50)

## 2020-03-09 LAB — PHOSPHORUS: Phosphorus: 4 mg/dL (ref 2.3–4.6)

## 2020-03-09 NOTE — Progress Notes (Signed)
Name: Jessica Stephens  MRN/ DOB: [id number], [birth year]    Age/ Sex: 69 y.o., female    PCP: [name], MD   Reason for Endocrinology Evaluation: Osteoporosis     Date of Initial Endocrinology Evaluation: 03/09/2020     HPI: [name] is a 69 y.o. female with a past medical history of UC, HTN and dyslipidemia and osteoporosis. The patient presented for initial endocrinology clinic visit on 03/09/2020 for consultative assistance with her Osteoporosis.    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 Abnormal transaminases   . Anxiety   . Arthritis   . Esophageal stricture   . GERD (gastroesophageal reflux disease)   . Hiatal hernia   . Hyperlipidemia   . Hypertension   . Maxillary sinusitis   . Proctitis   . Ulcerative colitis (Jessica Stephens)    left sided  . Vitamin B12 deficiency     Past Surgical History:  Past Surgical History:  Procedure Laterality Date  . CATARACT EXTRACTION    . CESAREAN SECTION    . nose surg    . NOSE SURGERY        Social History:  reports that she has never smoked. She has never used smokeless tobacco. She reports current alcohol use. She reports that she does not use drugs.  Family History: family history includes Arthritis in her mother; Dementia in her mother; Diabetes in her father; Hyperlipidemia in her father; Hypertension in her father.   HOME MEDICATIONS: Allergies as of 03/09/2020      Reactions   Alendronate Sodium     Other Other (See Comments)   "messes up blood"   Sulfonamide Derivatives       Medication List       Accurate as of March 09, 2020 12:26 PM. If you have any questions, ask your nurse or doctor.        amLODipine 10 MG tablet Commonly known as: NORVASC TAKE 1 TABLET BY MOUTH EVERY DAY FOR BLOOD PRESSURE   atorvastatin 20 MG tablet Commonly known as: LIPITOR Take 1 tablet (20 mg total) by mouth daily.   cetirizine 10 MG tablet Commonly known as: ZyrTEC Allergy Take 1 tablet (10 mg total) by mouth daily.   esomeprazole 20 MG capsule Commonly known as: NEXIUM Take 20 mg by mouth daily as needed (indigestion).   fluticasone 50 MCG/ACT nasal spray Commonly known as: FLONASE Place 2 sprays into both nostrils daily.   mesalamine 1.2 g EC tablet Commonly known as: Lialda Take 1-2 tablets (1.2-2.4 g total) by mouth 2 (two) times daily.   olmesartan 20 MG tablet Commonly known as: BENICAR Take 1 tablet (20 mg total) by mouth daily.   Vision Vitamins Tabs Take 1 tablet by mouth daily.   Vitamin D3 Super Strength 50 MCG (2000 UT) Caps Generic drug: Cholecalciferol Take by mouth.  REVIEW OF SYSTEMS: A comprehensive ROS was conducted with the patient and is negative except as per HPI and below:  ROS     OBJECTIVE:  VS: There were no vitals taken for this visit.   Wt Readings from Last 3 Encounters:  02/29/20 158 lb (71.7 kg)  12/22/18 161 lb 12.8 oz (73.4 kg)  02/18/18 155 lb 12.8 oz (70.7 kg)     EXAM: General: Pt appears well and is in NAD  Hydration: Well-hydrated with moist mucous membranes and good skin turgor  Eyes: External eye exam normal without stare, lid lag or exophthalmos.  EOM intact.  PERRL.  Ears, Nose, Throat: Hearing: Grossly intact bilaterally Dental: Good dentition  Throat: Clear without mass, erythema or exudate  Neck: General: Supple without adenopathy. Thyroid: Thyroid size normal.  No goiter or nodules appreciated. No  thyroid bruit.  Lungs: Clear with good BS bilat with no rales, rhonchi, or wheezes  Heart: Auscultation: RRR.  Abdomen: Normoactive bowel sounds, soft, nontender, without masses or organomegaly palpable  Extremities: Gait and station: Normal gait  Digits and nails: No clubbing, cyanosis, petechiae, or nodes Head and neck: Normal alignment and mobility BL UE: Normal ROM and strength. BL LE: No pretibial edema normal ROM and strength.  Skin: Hair: Texture and amount normal with gender appropriate distribution Skin Inspection: No rashes, acanthosis nigricans/skin tags. No lipohypertrophy Skin Palpation: Skin temperature, texture, and thickness normal to palpation  Neuro: Cranial nerves: II - XII grossly intact  Cerebellar: Normal coordination and movement; no tremor Motor: Normal strength throughout DTRs: 2+ and symmetric in UE without delay in relaxation phase  Mental Status: Judgment, insight: Intact Orientation: Oriented to time, place, and person Memory: Intact for recent and remote events Mood and affect: No depression, anxiety, or agitation     DATA REVIEWED:  Results for SHAKEMA, SURITA (MRN [id number]) as of 03/10/2020 14:14  Ref. Range 03/09/2020 13:45  Sodium Latest Ref Range: 135 - 145 mEq/L 140  Potassium Latest Ref Range: 3.5 - 5.1 mEq/L 4.5  Chloride Latest Ref Range: 96 - 112 mEq/L 104  CO2 Latest Ref Range: 19 - 32 mEq/L 30  Glucose Latest Ref Range: 70 - 99 mg/dL 108 (H)  BUN Latest Ref Range: 6 - 23 mg/dL 16  Creatinine Latest Ref Range: 0.40 - 1.20 mg/dL 0.78  Calcium Latest Ref Range: 8.4 - 10.5 mg/dL 9.3  Phosphorus Latest Ref Range: 2.3 - 4.6 mg/dL 4.0  Alkaline Phosphatase Latest Ref Range: 39 - 117 U/L 97  Albumin Latest Ref Range: 3.5 - 5.2 g/dL 4.2  AST Latest Ref Range: 0 - 37 U/L 19  ALT Latest Ref Range: 0 - 35 U/L 22  Total Protein Latest Ref Range: 6.0 - 8.3 g/dL 7.3  Total Bilirubin Latest Ref Range: 0.2 - 1.2 mg/dL 0.5  GFR Latest Ref Range:  >60.00 mL/min 78.64  VITD Latest Ref Range: 30.00 - 100.00 ng/mL 48.20  PTH, Intact Latest Ref Range: 14 - 64 pg/mL 34  TSH Latest Ref Range: 0.35 - 4.50 uIU/mL 1.29       DXA 01/06/2019 The BMD measured at [facility] L1-L4 is 0.767 g/cm2 with a T-score of -3.4.   This patient is considered OSTEOPOROTIC according to [facility]) criteria. The scan quality is good.  Site Region Measured Date Measured Age YA T-score BMD Significant CHANGE  AP Spine  L1-L4      01/06/2019    66.1         -3.4  0.767 g/cm2  DualFemur Neck Right 01/06/2019    66.1         -3.3    0.573 g/cm2  DualFemur Total Mean 01/06/2019    66.1         -2.6    0.674 g/cm2 ASSESSMENT/PLAN/RECOMMENDATIONS:   1. Osteoporosis:   - Pt with osteoporosis since 2009 , she admits to not understanding the condition and not seeking treatment until recently. She understands she is at a very high risk for fracture - She stopped calcium because she heard it causes renal stones, she consumes 2 servings of dairy daily, so I have advised her to add 500 mg of OTC calcium  - I have offered her Teriparatide or Abaloparatide as the best option for her due to severity of osteoporosis but she declines daily injections - Given her ulcerative colitis , I would like to avoid oral bisphosphonates  - Pt agreed to proceed with Prolia , discussed local reaction as well as flu like symptoms as side effects  - BMP, PTH, Mg, Phos are all normal   Medications : Calcium 500 mg daily  Vitamin D 2000 iu daily  Prolia 60 mg [place] Q 6 months   2. HTN:   Pt admits to non-compliance with medication intake , she will go home and take her medications     F/U in 6 months   I spent 45 minutes preparing to see the patient by review of recent labs, imaging and procedures, obtaining and reviewing separately obtained history, communicating with the patient, ordering medications, tests or procedures, and documenting clinical  information in the EHR including the differential Dx, treatment, and any further evaluation and other management   Signed electronically by: Mack Guise, MD  [facility]  [facility] [address] [place], [place] [address] Phone: [phone] FAX: [phone]   CC: [name], MD [address] [place] [address] Phone: [phone] Fax: [phone]   Return to Endocrinology clinic as below: Future Appointments  Date Time Provider [place]  03/09/2020  1:00 PM Rizwan Kuyper, Melanie Crazier, MD [id number] None

## 2020-03-09 NOTE — Patient Instructions (Signed)
-   Please continue to consume 2 servings of dairy daily but add 1 tablet of calcium daily 500 mg - Continue Vitamin D 2000 iu daily  - We will get authorization from your insurance company about Prolia , we will contact you when this has been approved.

## 2020-03-10 LAB — PARATHYROID HORMONE, INTACT (NO CA): PTH: 34 pg/mL (ref 14–64)

## 2020-03-11 ENCOUNTER — Encounter: Payer: Self-pay | Admitting: Internal Medicine

## 2020-03-11 DIAGNOSIS — M810 Age-related osteoporosis without current pathological fracture: Secondary | ICD-10-CM | POA: Insufficient documentation

## 2020-03-16 DIAGNOSIS — N8110 Cystocele, unspecified: Secondary | ICD-10-CM | POA: Diagnosis not present

## 2020-03-16 DIAGNOSIS — N3281 Overactive bladder: Secondary | ICD-10-CM | POA: Diagnosis not present

## 2020-03-16 DIAGNOSIS — R339 Retention of urine, unspecified: Secondary | ICD-10-CM | POA: Diagnosis not present

## 2020-04-06 DIAGNOSIS — N898 Other specified noninflammatory disorders of vagina: Secondary | ICD-10-CM | POA: Diagnosis not present

## 2020-04-06 DIAGNOSIS — N8111 Cystocele, midline: Secondary | ICD-10-CM | POA: Diagnosis not present

## 2020-04-06 DIAGNOSIS — N3281 Overactive bladder: Secondary | ICD-10-CM | POA: Diagnosis not present

## 2020-04-06 DIAGNOSIS — F411 Generalized anxiety disorder: Secondary | ICD-10-CM | POA: Diagnosis not present

## 2020-05-02 DIAGNOSIS — I10 Essential (primary) hypertension: Secondary | ICD-10-CM | POA: Diagnosis not present

## 2020-05-02 DIAGNOSIS — E669 Obesity, unspecified: Secondary | ICD-10-CM | POA: Diagnosis not present

## 2020-05-02 DIAGNOSIS — Z6831 Body mass index (BMI) 31.0-31.9, adult: Secondary | ICD-10-CM | POA: Diagnosis not present

## 2020-05-02 DIAGNOSIS — N882 Stricture and stenosis of cervix uteri: Secondary | ICD-10-CM | POA: Diagnosis not present

## 2020-05-02 DIAGNOSIS — N8110 Cystocele, unspecified: Secondary | ICD-10-CM | POA: Diagnosis not present

## 2020-05-02 DIAGNOSIS — E785 Hyperlipidemia, unspecified: Secondary | ICD-10-CM | POA: Diagnosis not present

## 2020-05-02 DIAGNOSIS — N813 Complete uterovaginal prolapse: Secondary | ICD-10-CM | POA: Diagnosis not present

## 2020-05-02 DIAGNOSIS — K219 Gastro-esophageal reflux disease without esophagitis: Secondary | ICD-10-CM | POA: Diagnosis not present

## 2020-06-21 ENCOUNTER — Other Ambulatory Visit: Payer: Self-pay | Admitting: Family Medicine

## 2020-06-21 DIAGNOSIS — Z1231 Encounter for screening mammogram for malignant neoplasm of breast: Secondary | ICD-10-CM

## 2020-06-27 ENCOUNTER — Ambulatory Visit (HOSPITAL_COMMUNITY)
Admission: EM | Admit: 2020-06-27 | Discharge: 2020-06-27 | Disposition: A | Discharge status: Home / Self Care | Payer: PPO | Attending: Family Medicine | Admitting: Family Medicine

## 2020-06-27 ENCOUNTER — Encounter (HOSPITAL_COMMUNITY): Payer: Self-pay | Admitting: Emergency Medicine

## 2020-06-27 ENCOUNTER — Other Ambulatory Visit: Payer: Self-pay

## 2020-06-27 DIAGNOSIS — R059 Cough, unspecified: Secondary | ICD-10-CM

## 2020-06-27 DIAGNOSIS — J069 Acute upper respiratory infection, unspecified: Secondary | ICD-10-CM | POA: Diagnosis not present

## 2020-06-27 DIAGNOSIS — R000 Tachycardia, unspecified: Secondary | ICD-10-CM

## 2020-06-27 MED ORDER — ACETAMINOPHEN 325 MG PO TABS
650.0000 mg | ORAL_TABLET | Freq: Once | ORAL | Status: AC
  Administered 2020-06-27: 650 mg via ORAL

## 2020-06-27 MED ORDER — ACETAMINOPHEN 325 MG PO TABS
ORAL_TABLET | ORAL | Status: AC
  Filled 2020-06-27: qty 2

## 2020-06-27 MED ORDER — FLUTICASONE PROPIONATE 50 MCG/ACT NA SUSP
2.0000 | Freq: Every day | NASAL | 2 refills | Status: DC
Start: 2020-06-27 — End: 2021-06-26

## 2020-06-27 MED ORDER — BENZONATATE 100 MG PO CAPS
200.0000 mg | ORAL_CAPSULE | Freq: Three times a day (TID) | ORAL | 0 refills | Status: DC | PRN
Start: 2020-06-27 — End: 2021-06-26

## 2020-06-27 NOTE — ED Provider Notes (Signed)
[place]    CSN: [id number] Arrival date & time: 06/27/20  1156      History   Chief Complaint Chief Complaint  Patient presents with  . Cough    HPI Jessica Stephens is a 70 y.o. female.   Jessica Stephens is a 32 y.o. here with chief complaint of cough, nasal congestion, and shortness of breath.  Reports symptoms began last night.  Reports cough as nonproductive.  Also reports chills that started today.  Has tried OTC medication with minimal relief.  Reports some sinus pain and ear pain.  Denies any specific alleviating or aggravating factors.  Denies any fevers, chest pain, N/V/D, numbness, tingling, weakness, abdominal pain, or headaches.    ROS: As per HPI, all other pertinent ROS negative    The history is provided by the patient.  Cough Associated symptoms: ear pain     Past Medical History:  Diagnosis Date  . Abnormal transaminases   . Anxiety   . Arthritis   . Esophageal stricture   . GERD (gastroesophageal reflux disease)   . Hiatal hernia   . Hyperlipidemia   . Hypertension   . Maxillary sinusitis   . Proctitis   . Ulcerative colitis ([place])    left sided  . Vitamin B12 deficiency     Patient Active Problem List   Diagnosis Date Noted  . Age-related osteoporosis without current pathological fracture 03/11/2020  . Pelvic prolapse 03/08/2020  . Lower urinary tract symptoms (LUTS) 12/05/2016  . Acute UTI 12/05/2016  . Flank pain, acute 12/05/2016  . Prolonged Q-T interval on ECG 08/21/2014  . Lobar pneumonia due to unspecified organism 08/21/2014  . CAP (community acquired pneumonia) 08/19/2014  . Community acquired pneumonia 08/19/2014  . Sepsis ([place]) 08/19/2014  . Hypokalemia 08/19/2014  . HYPERLIPIDEMIA 05/29/2010  . DYSPNEA 05/29/2010  . CHEST PAIN, ATYPICAL 05/02/2010  . ESOPHAGEAL STRICTURE 04/07/2010  . HIATAL HERNIA 04/07/2010  . ULCERATIVE PROCTITIS 04/07/2010  . ARTHRITIS 04/07/2010  . LUQ PAIN 04/07/2010  .  TRANSAMINASES, SERUM, ELEVATED 04/07/2010  . VITAMIN B12 DEFICIENCY 01/30/2008  . ANXIETY, CHRONIC 01/29/2008  . Essential hypertension 01/29/2008  . CHRONIC MAXILLARY SINUSITIS 04/24/2007  . G E R D 04/24/2007  . UNSPECIFIED OSTEOPOROSIS 04/24/2007  . ULCERATIVE COLITIS, LEFT SIDED 02/25/2007    Past Surgical History:  Procedure Laterality Date  . CATARACT EXTRACTION    . CESAREAN SECTION    . nose surg    . NOSE SURGERY      OB History   No obstetric history on file.      Home Medications    Prior to Admission medications   Medication Sig Start Date End Date Taking? Authorizing Provider  benzonatate (TESSALON PERLES) 100 MG capsule Take 2 capsules (200 mg total) by mouth 3 (three) times daily as needed for cough. 06/27/20  Yes Pearson Forster, NP  fluticasone (FLONASE) 50 MCG/ACT nasal spray Place 2 sprays into both nostrils daily. 06/27/20  Yes Pearson Forster, NP  amLODipine (NORVASC) 10 MG tablet TAKE 1 TABLET BY MOUTH EVERY DAY FOR BLOOD PRESSURE 02/29/20   [name], MD  atorvastatin (LIPITOR) 20 MG tablet Take 1 tablet (20 mg total) by mouth daily. 02/29/20   [name], MD  cetirizine (ZYRTEC ALLERGY) 10 MG tablet Take 1 tablet (10 mg total) by mouth daily. 02/29/20   [name], MD  Cholecalciferol (VITAMIN D3 SUPER STRENGTH) 50 MCG (2000 UT) CAPS Take by mouth.  [provider]  esomeprazole (NEXIUM) 20 MG capsule Take 20 mg by mouth daily as needed (indigestion).     [provider]  mesalamine (LIALDA) 1.2 g EC tablet Take 1-2 tablets (1.2-2.4 g total) by mouth 2 (two) times daily. 11/26/19   [name], MD  Multiple Vitamins-Minerals (VISION VITAMINS) TABS Take 1 tablet by mouth daily.    [provider]  olmesartan (BENICAR) 20 MG tablet Take 1 tablet (20 mg total) by mouth daily. 02/29/20   [name], MD    Family History Family History  Problem Relation Age of Onset  . Arthritis Mother   .  Dementia Mother   . Diabetes Father   . Hyperlipidemia Father   . Hypertension Father     Social History Social History   Tobacco Use  . Smoking status: Never Smoker  . Smokeless tobacco: Never Used  Vaping Use  . Vaping Use: Never used  Substance Use Topics  . Alcohol use: Yes    Comment: occasionally  . Drug use: No     Allergies   Alendronate sodium, Other, and Sulfonamide derivatives   Review of Systems Review of Systems  HENT: Positive for ear pain and sinus pain.   Respiratory: Positive for cough.      Physical Exam Triage Vital Signs ED Triage Vitals  Enc Vitals Group     BP 06/27/20 1258 (!) 173/88     Pulse Rate 06/27/20 1258 (!) 125     Resp 06/27/20 1258 18     Temp 06/27/20 1258 99.9 F (37.7 C)     Temp Source 06/27/20 1258 Oral     SpO2 06/27/20 1258 96 %     Weight --      Height --      Head Circumference --      Peak Flow --      Pain Score 06/27/20 1259 6     Pain Loc --      Pain Edu? --      Excl. in [place]? --    No data found.  Updated Vital Signs BP (!) 173/88 (BP Location: Right Arm)   Pulse (!) 112   Temp 99.9 F (37.7 C) (Oral)   Resp 18   SpO2 96%   Visual Acuity Right Eye Distance:   Left Eye Distance:   Bilateral Distance:    Right Eye Near:   Left Eye Near:    Bilateral Near:     Physical Exam Vitals and nursing note reviewed.  Constitutional:      General: She is not in acute distress.    Appearance: Normal appearance. She is not ill-appearing, toxic-appearing or diaphoretic.  HENT:     Head: Normocephalic and atraumatic.     Right Ear: Tympanic membrane, ear canal and external ear normal.     Left Ear: Tympanic membrane, ear canal and external ear normal.     Nose: Nose normal.     Mouth/Throat:     Mouth: Mucous membranes are moist.  Eyes:     Conjunctiva/sclera: Conjunctivae normal.  Cardiovascular:     Rate and Rhythm: Regular rhythm. Tachycardia present.     Pulses: Normal pulses.     Heart sounds:  Normal heart sounds.  Pulmonary:     Effort: Pulmonary effort is normal.     Breath sounds: Normal breath sounds.     Comments: Nonproductive cough present  Abdominal:     General: Abdomen is flat.  Musculoskeletal:  General: Normal range of motion.     Cervical back: Normal range of motion.  Skin:    General: Skin is warm and dry.  Neurological:     General: No focal deficit present.     Mental Status: She is alert and oriented to person, place, and time.  Psychiatric:        Mood and Affect: Mood normal.      UC Treatments / Results  Labs (all labs ordered are listed, but only abnormal results are displayed) Labs Reviewed - No data to display  EKG   Radiology No results found.  Procedures Procedures (including critical care time)  Medications Ordered in UC Medications  acetaminophen (TYLENOL) tablet 650 mg (650 mg Oral Given 06/27/20 1304)    Initial Impression / Assessment and Plan / UC Course  I have reviewed the triage vital signs and the nursing notes.  Pertinent labs & imaging results that were available during my care of the patient were reviewed by me and considered in my medical decision making (see chart for details).     Cough Viral URI Assessment negative for red flags for concerns.  EKG sinus tachy with regular rhythm.  Tessalon Perles PRN for cough Flonase 2 sprays daily  Continue OTC medications Use humidifier or sit in bathroom with hot shower running Rest and drink lots of fluids.  Follow up with PCP Final Clinical Impressions(s) / UC Diagnoses   Final diagnoses:  Cough  Viral URI with cough     Discharge Instructions     Continue to use Tylenol as needed for pain and fever.  You can use over-the-counter-allergy medication.  Use Flonase 2 sprays twice a day.   Use a humidifier or sit in a bathroom with a hot shower running to help with shortness of breath.   Take the Tessalon perles as needed for cough.   Rest and drink  lots of fluids.   Follow up with your primary care provider if your symptoms are not improving in the next few days.     ED Prescriptions    Medication Sig Dispense Auth. Provider   fluticasone (FLONASE) 50 MCG/ACT nasal spray Place 2 sprays into both nostrils daily. 18.2 mL Pearson Forster, NP   benzonatate (TESSALON PERLES) 100 MG capsule Take 2 capsules (200 mg total) by mouth 3 (three) times daily as needed for cough. 20 capsule Pearson Forster, NP     PDMP not reviewed this encounter.   Pearson Forster, NP 06/27/20 1350

## 2020-06-27 NOTE — ED Triage Notes (Signed)
Pt sts nasal congestion x 1 week; pt sts now having chills and cough

## 2020-06-27 NOTE — Discharge Instructions (Addendum)
Continue to use Tylenol as needed for pain and fever.  You can use over-the-counter-allergy medication.  Use Flonase 2 sprays twice a day.   Use a humidifier or sit in a bathroom with a hot shower running to help with shortness of breath.   Take the Tessalon perles as needed for cough.   Rest and drink lots of fluids.   Follow up with your primary care provider if your symptoms are not improving in the next few days.

## 2020-06-29 ENCOUNTER — Ambulatory Visit (INDEPENDENT_AMBULATORY_CARE_PROVIDER_SITE_OTHER): Payer: PPO

## 2020-06-29 ENCOUNTER — Ambulatory Visit (HOSPITAL_COMMUNITY)
Admission: EM | Admit: 2020-06-29 | Discharge: 2020-06-29 | Disposition: A | Discharge status: Home / Self Care | Payer: PPO | Attending: Medical Oncology | Admitting: Medical Oncology

## 2020-06-29 ENCOUNTER — Other Ambulatory Visit: Payer: Self-pay

## 2020-06-29 ENCOUNTER — Encounter (HOSPITAL_COMMUNITY): Payer: Self-pay | Admitting: Emergency Medicine

## 2020-06-29 ENCOUNTER — Telehealth: Payer: Self-pay | Admitting: Family Medicine

## 2020-06-29 DIAGNOSIS — R509 Fever, unspecified: Secondary | ICD-10-CM | POA: Diagnosis not present

## 2020-06-29 DIAGNOSIS — R059 Cough, unspecified: Secondary | ICD-10-CM | POA: Diagnosis not present

## 2020-06-29 DIAGNOSIS — J189 Pneumonia, unspecified organism: Secondary | ICD-10-CM

## 2020-06-29 DIAGNOSIS — I700 Atherosclerosis of aorta: Secondary | ICD-10-CM | POA: Diagnosis not present

## 2020-06-29 MED ORDER — DOXYCYCLINE HYCLATE 100 MG PO CAPS: 100.0000 mg | ORAL_CAPSULE | Freq: Two times a day (BID) | ORAL | 0 refills | Status: DC

## 2020-06-29 NOTE — ED Triage Notes (Addendum)
Patient presents for continued cough, nasal congestion, and developing fever (100.9 this morning at home).  Patient c/o diarrhea, and anorexia.  Patient c/o pressure to ears, sinuses.

## 2020-06-29 NOTE — Telephone Encounter (Signed)
Pt has been scheduled.  °

## 2020-06-29 NOTE — Telephone Encounter (Signed)
Pt's husband called in stating that pt was seen at the urgent care today, they told them they need to f/up by Friday. They told her she has pneumonia. They said make them aware that they were to be sending in medications for the pt but it doesn't appear that it has been done yet.   Pt's husband wanted to know what they should do since Dr. Carlota Raspberry is out, Delfino Lovett is out and Dr. Birdie Riddle doesn't have any openings.  Please advise   pt was seen at the [facility] urgent care

## 2020-06-29 NOTE — Telephone Encounter (Signed)
Pt can see Richard for follow up next week discussed with husband, given ED precautions for the weekend.

## 2020-06-29 NOTE — ED Provider Notes (Signed)
[facility]    CSN: [id number] Arrival date & time: 06/29/20  0941      History   Chief Complaint Chief Complaint  Patient presents with  . Fever    HPI Jessica Stephens is a 70 y.o. female.   HPI  Cough: Pt presents for return visit for cough. She was seen in office on 06/27/2020 but now reports that fevers have worsened. She has also now heard a "bubble" sensation when she takes a deep breathe along with a few episodes of diarrhea. She states that she "feels like the walking dead". No chest pains, sob, abd pain. Her OTC and rx medications have not been helping according to patient.    Past Medical History:  Diagnosis Date  . Abnormal transaminases   . Anxiety   . Arthritis   . Esophageal stricture   . GERD (gastroesophageal reflux disease)   . Hiatal hernia   . Hyperlipidemia   . Hypertension   . Maxillary sinusitis   . Proctitis   . Ulcerative colitis ([place])    left sided  . Vitamin B12 deficiency     Patient Active Problem List   Diagnosis Date Noted  . Age-related osteoporosis without current pathological fracture 03/11/2020  . Pelvic prolapse 03/08/2020  . Lower urinary tract symptoms (LUTS) 12/05/2016  . Acute UTI 12/05/2016  . Flank pain, acute 12/05/2016  . Prolonged Q-T interval on ECG 08/21/2014  . Lobar pneumonia due to unspecified organism 08/21/2014  . CAP (community acquired pneumonia) 08/19/2014  . Community acquired pneumonia 08/19/2014  . Sepsis ([place]) 08/19/2014  . Hypokalemia 08/19/2014  . HYPERLIPIDEMIA 05/29/2010  . DYSPNEA 05/29/2010  . CHEST PAIN, ATYPICAL 05/02/2010  . ESOPHAGEAL STRICTURE 04/07/2010  . HIATAL HERNIA 04/07/2010  . ULCERATIVE PROCTITIS 04/07/2010  . ARTHRITIS 04/07/2010  . LUQ PAIN 04/07/2010  . TRANSAMINASES, SERUM, ELEVATED 04/07/2010  . VITAMIN B12 DEFICIENCY 01/30/2008  . ANXIETY, CHRONIC 01/29/2008  . Essential hypertension 01/29/2008  . CHRONIC MAXILLARY SINUSITIS 04/24/2007  . G E R D  04/24/2007  . UNSPECIFIED OSTEOPOROSIS 04/24/2007  . ULCERATIVE COLITIS, LEFT SIDED 02/25/2007    Past Surgical History:  Procedure Laterality Date  . CATARACT EXTRACTION    . CESAREAN SECTION    . nose surg    . NOSE SURGERY      OB History   No obstetric history on file.      Home Medications    Prior to Admission medications   Medication Sig Start Date End Date Taking? Authorizing Provider  amLODipine (NORVASC) 10 MG tablet TAKE 1 TABLET BY MOUTH EVERY DAY FOR BLOOD PRESSURE 02/29/20   [name], MD  atorvastatin (LIPITOR) 20 MG tablet Take 1 tablet (20 mg total) by mouth daily. 02/29/20   [name], MD  benzonatate (TESSALON PERLES) 100 MG capsule Take 2 capsules (200 mg total) by mouth 3 (three) times daily as needed for cough. 06/27/20   Pearson Forster, NP  cetirizine (ZYRTEC ALLERGY) 10 MG tablet Take 1 tablet (10 mg total) by mouth daily. 02/29/20   [name], MD  Cholecalciferol (VITAMIN D3 SUPER STRENGTH) 50 MCG (2000 UT) CAPS Take by mouth.    [provider]  esomeprazole (NEXIUM) 20 MG capsule Take 20 mg by mouth daily as needed (indigestion).     [provider]  fluticasone (FLONASE) 50 MCG/ACT nasal spray Place 2 sprays into both nostrils daily. 06/27/20   Pearson Forster, NP  mesalamine (LIALDA) 1.2 g EC  tablet Take 1-2 tablets (1.2-2.4 g total) by mouth 2 (two) times daily. 11/26/19   [name], MD  Multiple Vitamins-Minerals (VISION VITAMINS) TABS Take 1 tablet by mouth daily.    [provider]  olmesartan (BENICAR) 20 MG tablet Take 1 tablet (20 mg total) by mouth daily. 02/29/20   [name], MD    Family History Family History  Problem Relation Age of Onset  . Arthritis Mother   . Dementia Mother   . Diabetes Father   . Hyperlipidemia Father   . Hypertension Father     Social History Social History   Tobacco Use  . Smoking status: Never Smoker  . Smokeless tobacco: Never Used   Vaping Use  . Vaping Use: Never used  Substance Use Topics  . Alcohol use: Yes    Comment: occasionally  . Drug use: No     Allergies   Alendronate sodium, Other, and Sulfonamide derivatives   Review of Systems Review of Systems  As stated above in HPI Physical Exam Triage Vital Signs ED Triage Vitals [06/29/20 1003]  Enc Vitals Group     BP (!) 166/78     Pulse Rate (!) 109     Resp 18     Temp (!) 102.3 F (39.1 C)     Temp Source Oral     SpO2 95 %     Weight      Height      Head Circumference      Peak Flow      Pain Score      Pain Loc      Pain Edu?      Excl. in [place]?    No data found.  Updated Vital Signs BP (!) 166/78 (BP Location: Left Arm)   Pulse (!) 109   Temp (!) 102.3 F (39.1 C) (Oral)   Resp 18   SpO2 95%   Physical Exam Vitals and nursing note reviewed.  Constitutional:      General: She is not in acute distress.    Appearance: Normal appearance. She is not ill-appearing, toxic-appearing or diaphoretic.  HENT:     Head: Normocephalic and atraumatic.     Right Ear: Tympanic membrane, ear canal and external ear normal.     Left Ear: Tympanic membrane, ear canal and external ear normal.     Nose: Nose normal. No congestion or rhinorrhea.     Mouth/Throat:     Mouth: Mucous membranes are moist.  Eyes:     Extraocular Movements: Extraocular movements intact.     Pupils: Pupils are equal, round, and reactive to light.  Cardiovascular:     Rate and Rhythm: Normal rate and regular rhythm.     Heart sounds: Normal heart sounds.  Pulmonary:     Effort: Pulmonary effort is normal. No respiratory distress.     Breath sounds: No stridor. Rhonchi (RLL) present. No wheezing or rales.  Chest:     Chest wall: No tenderness.  Musculoskeletal:     Cervical back: Neck supple.  Lymphadenopathy:     Cervical: No cervical adenopathy.  Skin:    General: Skin is warm.  Neurological:     Mental Status: She is alert and oriented to person, place,  and time.      UC Treatments / Results  Labs (all labs ordered are listed, but only abnormal results are displayed) Labs Reviewed - No data to display  EKG   Radiology No results found.  Procedures Procedures (including critical care time)  Medications Ordered in UC Medications - No data to display  Initial Impression / Assessment and Plan / UC Course  I have reviewed the triage vital signs and the nursing notes.  Pertinent labs & imaging results that were available during my care of the patient were reviewed by me and considered in my medical decision making (see chart for details).     New. Suspecting pneumonia. Chest x ray pending. If pneumonia is visualized on chest x ray I would recommend doxycyline paired with mucinex, rest and hydration.   UPDATE: Early pneumonia found. Treating with doxycyline. Discussed how to take along with common potential side effects and precautions. Mucinex, lung exercises and rest. Follow up with PCP in 3-5 days for follow up and to discuss the additional chronic findings seen on chest x ray that we reviewed today. Repeat chest x ray in 1 months needed and discussed with patient.  Final Clinical Impressions(s) / UC Diagnoses   Final diagnoses:  None   Discharge Instructions   None    ED Prescriptions    None     PDMP not reviewed this encounter.   Hughie Closs, [place] 06/29/20 1154

## 2020-07-05 ENCOUNTER — Other Ambulatory Visit: Payer: Self-pay

## 2020-07-05 ENCOUNTER — Telehealth (INDEPENDENT_AMBULATORY_CARE_PROVIDER_SITE_OTHER): Payer: PPO | Admitting: Registered Nurse

## 2020-07-05 ENCOUNTER — Encounter: Payer: Self-pay | Admitting: Registered Nurse

## 2020-07-05 DIAGNOSIS — J189 Pneumonia, unspecified organism: Secondary | ICD-10-CM | POA: Diagnosis not present

## 2020-07-05 MED ORDER — PREDNISONE 10 MG PO TABS: 10.0000 mg | ORAL_TABLET | Freq: Every day | ORAL | 0 refills | Status: DC

## 2020-07-05 MED ORDER — GUAIFENESIN-CODEINE 100-10 MG/5ML PO SYRP: 5.0000 mL | ORAL_SOLUTION | Freq: Every evening | ORAL | 0 refills | Status: DC | PRN

## 2020-07-05 MED ORDER — AMOXICILLIN-POT CLAVULANATE 875-125 MG PO TABS: 1.0000 | ORAL_TABLET | Freq: Two times a day (BID) | ORAL | 0 refills | Status: DC

## 2020-07-05 MED ORDER — HYDROCODONE-HOMATROPINE 5-1.5 MG/5ML PO SYRP: 5.0000 mL | ORAL_SOLUTION | Freq: Every evening | ORAL | 0 refills | Status: DC | PRN

## 2020-07-05 NOTE — Progress Notes (Signed)
Telemedicine Encounter- SOAP NOTE Established Patient  This telephone encounter was conducted with the patient's (or proxy's) verbal consent via audio telecommunications: yes  Patient was instructed to have this encounter in a suitably private space; and to only have persons present to whom they give permission to participate. In addition, patient identity was confirmed by use of name plus two identifiers (DOB and address).  I discussed the limitations, risks, security and privacy concerns of performing an evaluation and management service by telephone and the availability of in person appointments. I also discussed with the patient that there may be a patient responsible charge related to this service. The patient expressed understanding and agreed to proceed.  I spent a total of 22 minutes talking with the patient or their proxy.  Patient at home Provider in office  Chief Complaint  Patient presents with  . Follow-up    Urgent care follow up   . Cough    Pt reports cough, productive, started Sunday (over a week) pt has been trying to nurse this at home, still having congestion thinks this is bronchitis as she is prone to infections     Subjective   Jessica Stephens is a 70 y.o. established patient. Telephone visit today for follow up  HPI Seen in Urgent Care Sunday, dx with CAP Started on doxycycline Pt took two doses and stopped as she had upset stomach Does have hx of QT prolongation, causing avoidance of macrolides and fluoroquinolones.   Symptoms have persisted: soboe, cough with yellow mucus, chest tightness, cough at nighttime. No new symptoms or concerns OTCs not effective.  Patient Active Problem List   Diagnosis Date Noted  . Age-related osteoporosis without current pathological fracture 03/11/2020  . Pelvic prolapse 03/08/2020  . Lower urinary tract symptoms (LUTS) 12/05/2016  . Acute UTI 12/05/2016  . Flank pain, acute 12/05/2016  . Prolonged Q-T interval on  ECG 08/21/2014  . Lobar pneumonia due to unspecified organism 08/21/2014  . CAP (community acquired pneumonia) 08/19/2014  . Community acquired pneumonia 08/19/2014  . Sepsis ([place]) 08/19/2014  . Hypokalemia 08/19/2014  . HYPERLIPIDEMIA 05/29/2010  . DYSPNEA 05/29/2010  . CHEST PAIN, ATYPICAL 05/02/2010  . ESOPHAGEAL STRICTURE 04/07/2010  . HIATAL HERNIA 04/07/2010  . ULCERATIVE PROCTITIS 04/07/2010  . ARTHRITIS 04/07/2010  . LUQ PAIN 04/07/2010  . TRANSAMINASES, SERUM, ELEVATED 04/07/2010  . VITAMIN B12 DEFICIENCY 01/30/2008  . ANXIETY, CHRONIC 01/29/2008  . Essential hypertension 01/29/2008  . CHRONIC MAXILLARY SINUSITIS 04/24/2007  . G E R D 04/24/2007  . UNSPECIFIED OSTEOPOROSIS 04/24/2007  . ULCERATIVE COLITIS, LEFT SIDED 02/25/2007    Past Medical History:  Diagnosis Date  . Abnormal transaminases   . Anxiety   . Arthritis   . Esophageal stricture   . GERD (gastroesophageal reflux disease)   . Hiatal hernia   . Hyperlipidemia   . Hypertension   . Maxillary sinusitis   . Proctitis   . Ulcerative colitis ([place])    left sided  . Vitamin B12 deficiency     Current Outpatient Medications  Medication Sig Dispense Refill  . amLODipine (NORVASC) 10 MG tablet TAKE 1 TABLET BY MOUTH EVERY DAY FOR BLOOD PRESSURE 90 tablet 1  . amoxicillin-clavulanate (AUGMENTIN) 875-125 MG tablet Take 1 tablet by mouth 2 (two) times daily. 20 tablet 0  . atorvastatin (LIPITOR) 20 MG tablet Take 1 tablet (20 mg total) by mouth daily. 90 tablet 1  . benzonatate (TESSALON PERLES) 100 MG capsule Take 2 capsules (200 mg total)  by mouth 3 (three) times daily as needed for cough. 20 capsule 0  . cetirizine (ZYRTEC ALLERGY) 10 MG tablet Take 1 tablet (10 mg total) by mouth daily. 90 tablet 3  . Cholecalciferol (VITAMIN D3 SUPER STRENGTH) 50 MCG (2000 UT) CAPS Take by mouth.    . doxycycline (VIBRAMYCIN) 100 MG capsule Take 1 capsule (100 mg total) by mouth 2 (two) times daily. 20 capsule 0  .  esomeprazole (NEXIUM) 20 MG capsule Take 20 mg by mouth daily as needed (indigestion).     . fluticasone (FLONASE) 50 MCG/ACT nasal spray Place 2 sprays into both nostrils daily. 18.2 mL 2  . guaiFENesin-codeine (ROBITUSSIN AC) 100-10 MG/5ML syrup Take 5 mLs by mouth at bedtime as needed for cough. 120 mL 0  . HYDROcodone-homatropine (HYCODAN) 5-1.5 MG/5ML syrup Take 5 mLs by mouth at bedtime as needed for cough. 120 mL 0  . mesalamine (LIALDA) 1.2 g EC tablet Take 1-2 tablets (1.2-2.4 g total) by mouth 2 (two) times daily. 180 tablet 0  . Multiple Vitamins-Minerals (VISION VITAMINS) TABS Take 1 tablet by mouth daily.    Marland Kitchen olmesartan (BENICAR) 20 MG tablet Take 1 tablet (20 mg total) by mouth daily. 90 tablet 1  . predniSONE (DELTASONE) 10 MG tablet Take 1 tablet (10 mg total) by mouth daily with breakfast. 5 tablet 0   No current facility-administered medications for this visit.    Allergies  Allergen Reactions  . Alendronate Sodium   . Other Other (See Comments)    "messes up blood"   . Sulfonamide Derivatives     Social History   Socioeconomic History  . Marital status: Married    Spouse name: Not on file  . Number of children: Not on file  . Years of education: Not on file  . Highest education level: Not on file  Occupational History  . Not on file  Tobacco Use  . Smoking status: Never Smoker  . Smokeless tobacco: Never Used  Vaping Use  . Vaping Use: Never used  Substance and Sexual Activity  . Alcohol use: Yes    Comment: occasionally  . Drug use: No  . Sexual activity: Not on file  Other Topics Concern  . Not on file  Social History Narrative  . Not on file   Social Determinants of Health   Financial Resource Strain: Not on file  Food Insecurity: Not on file  Transportation Needs: Not on file  Physical Activity: Not on file  Stress: Not on file  Social Connections: Not on file  Intimate Partner Violence: Not on file    ROS Per hpi   Objective    Vitals as reported by the patient: There were no vitals filed for this visit.  Verita was seen today for follow-up and cough.  Diagnoses and all orders for this visit:  Community acquired pneumonia of right lower lobe of lung -     amoxicillin-clavulanate (AUGMENTIN) 875-125 MG tablet; Take 1 tablet by mouth 2 (two) times daily. -     predniSONE (DELTASONE) 10 MG tablet; Take 1 tablet (10 mg total) by mouth daily with breakfast. -     HYDROcodone-homatropine (HYCODAN) 5-1.5 MG/5ML syrup; Take 5 mLs by mouth at bedtime as needed for cough. -     guaiFENesin-codeine (ROBITUSSIN AC) 100-10 MG/5ML syrup; Take 5 mLs by mouth at bedtime as needed for cough.   PLAN  Pharmacy unfortunately out of hycodan. Will sub in Robitussin AC.  Prednisone burst at dose of 84m  PO qd for five days  Switch to augmentin po bid for 10 days.  Close follow up if symptoms persist or fail to improve  Patient encouraged to call clinic with any questions, comments, or concerns.   I discussed the assessment and treatment plan with the patient. The patient was provided an opportunity to ask questions and all were answered. The patient agreed with the plan and demonstrated an understanding of the instructions.   The patient was advised to call back or seek an in-person evaluation if the symptoms worsen or if the condition fails to improve as anticipated.  I provided 22 minutes of non-face-to-face time during this encounter.  Maximiano Coss, NP  Primary Care at [facility]

## 2020-07-06 ENCOUNTER — Ambulatory Visit: Payer: PPO | Admitting: Registered Nurse

## 2020-07-18 ENCOUNTER — Telehealth: Payer: Self-pay | Admitting: Internal Medicine

## 2020-07-18 NOTE — Telephone Encounter (Signed)
Patient called and requested that the process for Prolia injections

## 2020-08-12 NOTE — Telephone Encounter (Signed)
Brandy,  Could you help patient? I am unsure of the whole process for Prolia.

## 2020-08-12 NOTE — Telephone Encounter (Signed)
Proia VOB initiated via [email or web address]

## 2020-08-12 NOTE — Telephone Encounter (Signed)
Patient called and requested that the process for Prolia injections - patient is demanding a call back at [phone]

## 2020-08-16 ENCOUNTER — Ambulatory Visit
Admission: RE | Admit: 2020-08-16 | Discharge: 2020-08-16 | Disposition: A | Discharge status: Home / Self Care | Payer: PPO | Source: Ambulatory Visit | Attending: Family Medicine | Admitting: Family Medicine

## 2020-08-16 ENCOUNTER — Other Ambulatory Visit: Payer: Self-pay

## 2020-08-16 DIAGNOSIS — Z1231 Encounter for screening mammogram for malignant neoplasm of breast: Secondary | ICD-10-CM | POA: Diagnosis not present

## 2020-08-17 NOTE — Telephone Encounter (Signed)
Pt would like to know what the insurance is going to pay

## 2020-08-17 NOTE — Telephone Encounter (Signed)
[employer] would not disclose coverage information to rep. Will need to contact [employer] directly for VOB.

## 2020-08-17 NOTE — Telephone Encounter (Signed)
Pt calling to f/u with last message. Would like a call back

## 2020-08-17 NOTE — Telephone Encounter (Signed)
MEDICAL BENEFIT SUMMARY Patient Out-of-Pocket Responsibility Coverage Available Authorization Required Deductible Co-pay/Coinsurance Prolia  [facility] FACILITY FEE ADMIN FEE PURCHASE OR REFERRAL: YES NO PRIMARY Undisclosed SECONDARY $233 Met of $233 Required 20%* Undisclosed* 20%* *Reflects patient costs once plan deductible is met. Please see Medical Benefit Details for further information regarding patient costs. Patient costs may vary based on services rendered. BENEFITS VERIFIED FOR THE FOLLOWING DIAGNOSIS AND INSURANCE PLANS Verified for Diagnosis M81.0 Site of Care  MD Office  Policy Status: Active  Policy Level: Primary Payer Name: Jessica Stephens ADVANTAGE-COMMERCIAL Plan Name: [employer] Plan 1 Policy Number: [id number] Employer Name: Plan Type: PPO Group Number: [id number]  Payer Phone: [phone] Policy Status: Active   Policy Level: Secondary Payer Name: [facility] Plan Name: Traditional Medicare Policy Number: [id number] Employer Name: Plan Type: Part A B & D Group Number:  Payer Phone: [phone] PRIMARY MEDICAL BENEFIT DETAILS (PHYSICIAN PURCHASE, OR REFERRAL TO TREATING SITE) COVERAGE AVAILABLE:  Undisclosed COVERAGE DETAILS: For the secondary MD Purchase option, benefits for Prolia and administration are undisclosed. [employer] will not release benefits to a third party. This includes information regarding prior authorization requirements. Please call [phone] to obtain benefits for your patient.

## 2020-09-08 ENCOUNTER — Other Ambulatory Visit: Payer: Self-pay | Admitting: Family Medicine

## 2020-09-08 DIAGNOSIS — I10 Essential (primary) hypertension: Secondary | ICD-10-CM

## 2020-09-08 DIAGNOSIS — E785 Hyperlipidemia, unspecified: Secondary | ICD-10-CM

## 2020-09-12 ENCOUNTER — Ambulatory Visit: Payer: PPO | Admitting: Internal Medicine

## 2020-09-13 NOTE — Telephone Encounter (Signed)
Pt ready for scheduling  Out-of-pocket cost due at time of visit: $$285  Primary: [employer] Prolia co-insurance: 20% ($255) Admin fee co-insurance: $30  Secondary: n/a Prolia co-insurance:  Admin fee co-insurance:   Deductible: does not apply  Prior Auth: n/a PA# Valid:

## 2020-10-18 ENCOUNTER — Other Ambulatory Visit: Payer: Self-pay | Admitting: Family Medicine

## 2020-10-18 ENCOUNTER — Other Ambulatory Visit: Payer: Self-pay

## 2020-10-18 ENCOUNTER — Telehealth: Payer: Self-pay

## 2020-10-18 DIAGNOSIS — I10 Essential (primary) hypertension: Secondary | ICD-10-CM

## 2020-10-18 MED ORDER — OLMESARTAN MEDOXOMIL 20 MG PO TABS: 20.0000 mg | ORAL_TABLET | Freq: Every day | ORAL | 0 refills | Status: DC

## 2020-10-18 NOTE — Telephone Encounter (Signed)
Medication sent to pharmacy  

## 2020-10-18 NOTE — Telephone Encounter (Signed)
..   LAST APPOINTMENT DATE: 02/29/20  NEXT APPOINTMENT DATE:@Visit  date not found  MEDICATION: olmesartan 20 mg  Is the patient out of medication?   PHARMACY:  [facility] [address]   Let patient know to contact pharmacy at the end of the day to make sure medication is ready.  Please notify patient to allow 48-72 hours to process  Encourage patient to contact the pharmacy for refills or they can request refills through [place]:   LAST REFILL:  QTY:  REFILL DATE:    OTHER COMMENTS:   Will be out of medications on Friday 10/21/2020.  States pharmacy requested incorrect script.  States will be at home all day on 7/20 if questions in regard.   Okay for refill?  Please advise

## 2020-11-02 NOTE — Telephone Encounter (Signed)
LMTCB to schedule.

## 2020-11-04 ENCOUNTER — Other Ambulatory Visit: Payer: Self-pay

## 2020-11-04 ENCOUNTER — Ambulatory Visit (INDEPENDENT_AMBULATORY_CARE_PROVIDER_SITE_OTHER): Payer: PPO | Admitting: Family Medicine

## 2020-11-04 ENCOUNTER — Encounter: Payer: Self-pay | Admitting: Family Medicine

## 2020-11-04 VITALS — BP 144/81 | HR 66 | Temp 97.5°F | Resp 16 | Ht 60.0 in | Wt 157.6 lb

## 2020-11-04 DIAGNOSIS — Z860100 Personal history of colon polyps, unspecified: Secondary | ICD-10-CM | POA: Insufficient documentation

## 2020-11-04 DIAGNOSIS — N3001 Acute cystitis with hematuria: Secondary | ICD-10-CM

## 2020-11-04 DIAGNOSIS — R300 Dysuria: Secondary | ICD-10-CM | POA: Diagnosis not present

## 2020-11-04 DIAGNOSIS — K5190 Ulcerative colitis, unspecified, without complications: Secondary | ICD-10-CM | POA: Insufficient documentation

## 2020-11-04 LAB — POCT URINALYSIS DIPSTICK
Bilirubin, UA: NEGATIVE
Glucose, UA: NEGATIVE
Ketones, UA: NEGATIVE
Nitrite, UA: NEGATIVE
Protein, UA: NEGATIVE
Spec Grav, UA: <=1.005 — AB (ref 1.010–1.025)
Urobilinogen, UA: 0.2 E.U./dL
pH, UA: 6 (ref 5.0–8.0)

## 2020-11-04 MED ORDER — NITROFURANTOIN MONOHYD MACRO 100 MG PO CAPS: 100.0000 mg | ORAL_CAPSULE | Freq: Two times a day (BID) | ORAL | 0 refills | Status: DC

## 2020-11-04 NOTE — Progress Notes (Signed)
OFFICE VISIT  11/04/2020  CC:  Chief Complaint  Patient presents with   UTI symptoms    2 days; currently having sharp pain, frequency, burning. Has used ibuprofen otc   HPI:    Patient is a 70 y.o. Caucasian female who presents for "possible bladder infection". Onset 2 nights ago pinching sensation over bladder region, urinary urgency/frequency, burning with urination.  No nausea or flank pain.  No blood in urine.  No recent UTI.  Past Medical History:  Diagnosis Date   Abnormal transaminases    Anxiety    Arthritis    Esophageal stricture    GERD (gastroesophageal reflux disease)    Hiatal hernia    Hyperlipidemia    Hypertension    Maxillary sinusitis    OAB (overactive bladder)    Proctitis    Ulcerative colitis ([place])    left sided   Vitamin B12 deficiency     Past Surgical History:  Procedure Laterality Date   CATARACT EXTRACTION     CESAREAN SECTION     nose surg     NOSE SURGERY      Outpatient Medications Prior to Visit  Medication Sig Dispense Refill   amLODipine (NORVASC) 10 MG tablet TAKE 1 TABLET BY MOUTH EVERY DAY FOR BLOOD PRESSURE 90 tablet 0   amoxicillin-clavulanate (AUGMENTIN) 875-125 MG tablet Take 1 tablet by mouth 2 (two) times daily. 20 tablet 0   atorvastatin (LIPITOR) 20 MG tablet TAKE 1 TABLET BY MOUTH EVERY DAY 90 tablet 0   cetirizine (ZYRTEC ALLERGY) 10 MG tablet Take 1 tablet (10 mg total) by mouth daily. 90 tablet 3   Cholecalciferol (VITAMIN D3 SUPER STRENGTH) 50 MCG (2000 UT) CAPS Take by mouth.     mesalamine (LIALDA) 1.2 g EC tablet Take 1-2 tablets (1.2-2.4 g total) by mouth 2 (two) times daily. 180 tablet 0   Multiple Vitamins-Minerals (VISION VITAMINS) TABS Take 1 tablet by mouth daily.     olmesartan (BENICAR) 20 MG tablet Take 1 tablet (20 mg total) by mouth daily. 90 tablet 0   benzonatate (TESSALON PERLES) 100 MG capsule Take 2 capsules (200 mg total) by mouth 3 (three) times daily as needed for cough. (Patient not taking:  Reported on 11/04/2020) 20 capsule 0   esomeprazole (NEXIUM) 20 MG capsule Take 20 mg by mouth daily as needed (indigestion).  (Patient not taking: Reported on 11/04/2020)     fluticasone (FLONASE) 50 MCG/ACT nasal spray Place 2 sprays into both nostrils daily. (Patient not taking: Reported on 11/04/2020) 18.2 mL 2   guaiFENesin-codeine (ROBITUSSIN AC) 100-10 MG/5ML syrup Take 5 mLs by mouth at bedtime as needed for cough. (Patient not taking: Reported on 11/04/2020) 120 mL 0   doxycycline (VIBRAMYCIN) 100 MG capsule Take 1 capsule (100 mg total) by mouth 2 (two) times daily. (Patient not taking: Reported on 11/04/2020) 20 capsule 0   predniSONE (DELTASONE) 10 MG tablet Take 1 tablet (10 mg total) by mouth daily with breakfast. (Patient not taking: Reported on 11/04/2020) 5 tablet 0   No facility-administered medications prior to visit.    Allergies  Allergen Reactions   Alendronate Sodium    Other Other (See Comments)    "messes up blood"    Sulfonamide Derivatives     ROS As per HPI  PE: Vitals with BMI 11/04/2020 06/29/2020 06/27/2020  Height 5' 0"  - -  Weight 157 lbs 10 oz - -  BMI 18.84 - -  Systolic 166 063 -  Diastolic 81 78 -  Pulse 66 109 112   Gen: Alert, well appearing.  Patient is oriented to person, place, time, and situation. AFFECT: pleasant, lucid thought and speech. No further exam today.  LABS:    Chemistry      Component Value Date/Time   NA 140 03/09/2020 1345   NA 142 02/29/2020 1147   K 4.5 03/09/2020 1345   CL 104 03/09/2020 1345   CO2 30 03/09/2020 1345   BUN 16 03/09/2020 1345   BUN 12 02/29/2020 1147   CREATININE 0.78 03/09/2020 1345   CREATININE 0.77 01/20/2016 1202      Component Value Date/Time   CALCIUM 9.3 03/09/2020 1345   ALKPHOS 97 03/09/2020 1345   AST 19 03/09/2020 1345   ALT 22 03/09/2020 1345   BILITOT 0.5 03/09/2020 1345   BILITOT 0.5 02/29/2020 1147     Lab Results  Component Value Date   WBC 7.9 12/22/2018   HGB 15.9 12/22/2018    HCT 45.9 12/22/2018   MCV 88 12/22/2018   PLT 327 12/22/2018   POC CC dipstick UA today:1+ blood, 3+ leuks, o/w normal  IMPRESSION AND PLAN:  Acute UTI, with microscopic blood. She has sulfa allergy. Will start macrobid 100 bid x 3d. Urine sent for c/s.  An After Visit Summary was printed and given to the patient.  FOLLOW UP: No follow-ups on file.  Signed:  Crissie Sickles, MD           11/04/2020

## 2020-11-06 LAB — URINE CULTURE
MICRO NUMBER:: 12209969
SPECIMEN QUALITY:: ADEQUATE

## 2020-11-07 ENCOUNTER — Other Ambulatory Visit: Payer: Self-pay

## 2020-11-07 MED ORDER — CIPROFLOXACIN HCL 500 MG PO TABS: 500.0000 mg | ORAL_TABLET | Freq: Two times a day (BID) | ORAL | 0 refills | Status: AC

## 2020-11-12 NOTE — Telephone Encounter (Signed)
Please follow up with pt to see if pt wishes to proceed with Prolia therapy.   Thanks!

## 2020-11-16 NOTE — Telephone Encounter (Signed)
LVM--to call the office back regarding prolia.

## 2020-11-16 NOTE — Telephone Encounter (Signed)
Pt called back stated--cannot afford the prolia and wanted to know what other options. Please advise.

## 2020-11-18 NOTE — Telephone Encounter (Signed)
Pt is calling returning a phone call from nurse. Pt would like a call back. Can leave a Detailed voicemail.

## 2020-11-21 NOTE — Telephone Encounter (Signed)
Called and lvm  detailed voicemail regarding this information , asked pt to return our call.

## 2020-11-21 NOTE — Telephone Encounter (Signed)
Pt called back she  give her the information regarding Reclast infusion explain to the patient the was an out patient infusion that would done at the hospital. Pt was concerned about the cost gave the pt the Cpt to contact her insurance to seen what the cost would be after insurance. Pt said that she would call them call us back to let us know.Pt also wanted to if she need to pay  the full amount of Prolia  injection at once if she decides to go that , she was told that full amount will need to be pay.

## 2020-11-29 DIAGNOSIS — E669 Obesity, unspecified: Secondary | ICD-10-CM | POA: Diagnosis not present

## 2020-11-29 DIAGNOSIS — K219 Gastro-esophageal reflux disease without esophagitis: Secondary | ICD-10-CM | POA: Diagnosis not present

## 2020-11-29 DIAGNOSIS — Z86010 Personal history of colonic polyps: Secondary | ICD-10-CM | POA: Diagnosis not present

## 2020-11-29 DIAGNOSIS — K5190 Ulcerative colitis, unspecified, without complications: Secondary | ICD-10-CM | POA: Diagnosis not present

## 2020-12-07 ENCOUNTER — Other Ambulatory Visit: Payer: Self-pay | Admitting: Family Medicine

## 2020-12-07 DIAGNOSIS — E785 Hyperlipidemia, unspecified: Secondary | ICD-10-CM

## 2020-12-12 ENCOUNTER — Other Ambulatory Visit: Payer: Self-pay | Admitting: Gastroenterology

## 2020-12-12 DIAGNOSIS — R748 Abnormal levels of other serum enzymes: Secondary | ICD-10-CM

## 2020-12-21 NOTE — Telephone Encounter (Signed)
Hi Dr. Kelton Pillar,  Just wanted to send a FYI that pt has not scheduled Prolia injection.   Theadora Rama, CMA

## 2020-12-23 ENCOUNTER — Other Ambulatory Visit: Payer: Self-pay

## 2020-12-23 ENCOUNTER — Telehealth: Payer: Self-pay | Admitting: Family Medicine

## 2020-12-23 ENCOUNTER — Encounter: Payer: Self-pay | Admitting: Registered Nurse

## 2020-12-23 ENCOUNTER — Ambulatory Visit (INDEPENDENT_AMBULATORY_CARE_PROVIDER_SITE_OTHER): Payer: PPO | Admitting: Registered Nurse

## 2020-12-23 VITALS — BP 122/70 | HR 79 | Temp 98.2°F | Resp 18 | Ht 60.0 in | Wt 155.4 lb

## 2020-12-23 DIAGNOSIS — R3915 Urgency of urination: Secondary | ICD-10-CM

## 2020-12-23 DIAGNOSIS — K51919 Ulcerative colitis, unspecified with unspecified complications: Secondary | ICD-10-CM

## 2020-12-23 DIAGNOSIS — Z9109 Other allergy status, other than to drugs and biological substances: Secondary | ICD-10-CM

## 2020-12-23 DIAGNOSIS — E785 Hyperlipidemia, unspecified: Secondary | ICD-10-CM

## 2020-12-23 DIAGNOSIS — G479 Sleep disorder, unspecified: Secondary | ICD-10-CM

## 2020-12-23 DIAGNOSIS — Z23 Encounter for immunization: Secondary | ICD-10-CM | POA: Diagnosis not present

## 2020-12-23 DIAGNOSIS — I10 Essential (primary) hypertension: Secondary | ICD-10-CM

## 2020-12-23 LAB — POCT URINALYSIS DIPSTICK
Bilirubin, UA: NEGATIVE
Blood, UA: POSITIVE
Glucose, UA: NEGATIVE
Ketones, UA: NEGATIVE
Nitrite, UA: NEGATIVE
Protein, UA: NEGATIVE
Spec Grav, UA: 1.01 (ref 1.010–1.025)
Urobilinogen, UA: 0.2 E.U./dL
pH, UA: 6 (ref 5.0–8.0)

## 2020-12-23 MED ORDER — ATORVASTATIN CALCIUM 20 MG PO TABS: 20.0000 mg | ORAL_TABLET | Freq: Every day | ORAL | 3 refills | Status: DC

## 2020-12-23 MED ORDER — MESALAMINE 1.2 G PO TBEC: 1.2000 g | DELAYED_RELEASE_TABLET | Freq: Two times a day (BID) | ORAL | 0 refills | Status: AC

## 2020-12-23 MED ORDER — CETIRIZINE HCL 10 MG PO TABS: 10.0000 mg | ORAL_TABLET | Freq: Every day | ORAL | 3 refills | Status: AC

## 2020-12-23 MED ORDER — AMLODIPINE BESYLATE 10 MG PO TABS: 10.0000 mg | ORAL_TABLET | Freq: Every day | ORAL | 3 refills | Status: DC

## 2020-12-23 MED ORDER — NITROFURANTOIN MONOHYD MACRO 100 MG PO CAPS: 100.0000 mg | ORAL_CAPSULE | Freq: Two times a day (BID) | ORAL | 0 refills | Status: DC

## 2020-12-23 MED ORDER — TRAZODONE HCL 50 MG PO TABS
25.0000 mg | ORAL_TABLET | Freq: Every evening | ORAL | 3 refills | Status: DC | PRN
Start: 2020-12-23 — End: 2021-06-26

## 2020-12-23 NOTE — Patient Instructions (Addendum)
Ms. Jessica Stephens to meet in person.  In brief:  UTI: take macrobid 18m twice daily for 5 days. I will call if culture indicates that we need to change things up. This result should be in on Monday. Energy levels: will check labs today to determine if there is underlying issue, but may be largely related to sleep (or lack thereof!). Sleep: can try half of a tablet of trazodone taken about an hour before bed. It won't be too strong. If it doesn't do enough, it's okay to increase to a full tablet. Rechecking labs today for liver function Blood pressure is FANTASTIC! Great work. Should check at work when you're dizzy. If it's too low (below 100/60), ok to cut the amlodipine in half.   Follow up with Dr. GCarlota Raspberryin 6 months   Thank you!  Rich

## 2020-12-23 NOTE — Telephone Encounter (Signed)
Advised assistant for provider who saw pt today - planned to call pt.

## 2020-12-23 NOTE — Telephone Encounter (Signed)
Just wanted to let you know this patient gave a urine sample but did not get her blood drawn.

## 2020-12-23 NOTE — Progress Notes (Signed)
Established Patient Office Visit  Subjective:  Patient ID: Jessica Stephens, female    DOB: [birth year]  Age: 70 y.o. MRN: [id number]  CC:  Chief Complaint  Patient presents with   Follow-up    Med recheck    HPI Jessica Stephens presents for med check  Hypertension: Patient Currently taking: amlodipine 23m po qd, olmesartan 288mpo qd.  Good effect. No AEs. Denies CV symptoms including: chest pain, shob, doe, headache, visual changes, fatigue, claudication, and dependent edema.   Previous readings and labs: BP Readings from Last 3 Encounters:  12/23/20 122/70  11/04/20 (!) 144/81  06/29/20 (!) 166/78   Lab Results  Component Value Date   CREATININE 0.78 03/09/2020   Occ low bp at work, mild dizziness. Hesitant to change regimen based on good control at this time.  Hld On atrovastatin 2050mo qd Good effect, no ae Lab Results  Component Value Date   CHOL 170 02/29/2020   HDL 62 02/29/2020   LDLCALC 93 02/29/2020   TRIG 78 02/29/2020   CHOLHDL 2.7 02/29/2020    UC On mesalamine Good effect, no AE Symptoms stable.  UTI Frequency, urgency, suprapubic pain Ongoing 3-4 days Worsening Frequent UTI in past, last tx in Aug No constitutional symptoms.  Sleep disturbance Intermittent, no pattern that she can discern. Melatonin ineffective Seems like anxiety can impact things.  Past Medical History:  Diagnosis Date   Abnormal transaminases    Anxiety    Arthritis    Esophageal stricture    GERD (gastroesophageal reflux disease)    Hiatal hernia    Hyperlipidemia    Hypertension    Maxillary sinusitis    OAB (overactive bladder)    Proctitis    [place]ative colitis [place]  left sided   Vitamin B12 deficiency     Past Surgical History:  Procedure Laterality Date   CATARACT EXTRACTION     CESAREAN SECTION     nose surg     NOSE SURGERY      Family History  Problem Relation Age of Onset   Arthritis Mother    Dementia Mother    Diabetes  Father    Hyperlipidemia Father    Hypertension Father     Social History   Socioeconomic History   Marital status: Married    Spouse name: Not on file   Number of children: Not on file   Years of education: Not on file   Highest education level: Not on file  Occupational History   Not on file  Tobacco Use   Smoking status: Never   Smokeless tobacco: Never  Vaping Use   Vaping Use: Never used  Substance and Sexual Activity   Alcohol use: Yes    Comment: occasionally   Drug use: No   Sexual activity: Not on file  Other Topics Concern   Not on file  Social History Narrative   Not on file   Social Determinants of Health   Financial Resource Strain: Not on file  Food Insecurity: Not on file  Transportation Needs: Not on file  Physical Activity: Not on file  Stress: Not on file  Social Connections: Not on file  Intimate Partner Violence: Not on file    Outpatient Medications Prior to Visit  Medication Sig Dispense Refill   Cholecalciferol (VITAMIN D3 SUPER STRENGTH) 50 MCG (2000 UT) CAPS Take by mouth.     Multiple Vitamins-Minerals (VISION VITAMINS) TABS Take 1 tablet by mouth daily.  olmesartan (BENICAR) 20 MG tablet Take 1 tablet (20 mg total) by mouth daily. 90 tablet 0   SHINGRIX injection      amLODipine (NORVASC) 10 MG tablet TAKE 1 TABLET BY MOUTH EVERY DAY FOR BLOOD PRESSURE 90 tablet 0   atorvastatin (LIPITOR) 20 MG tablet TAKE 1 TABLET BY MOUTH EVERY DAY 90 tablet 0   cetirizine (ZYRTEC ALLERGY) 10 MG tablet Take 1 tablet (10 mg total) by mouth daily. 90 tablet 3   mesalamine (LIALDA) 1.2 g EC tablet Take 1-2 tablets (1.2-2.4 g total) by mouth 2 (two) times daily. 180 tablet 0   benzonatate (TESSALON PERLES) 100 MG capsule Take 2 capsules (200 mg total) by mouth 3 (three) times daily as needed for cough. (Patient not taking: Reported on 12/23/2020) 20 capsule 0   esomeprazole (NEXIUM) 20 MG capsule Take 20 mg by mouth daily as needed (indigestion).   (Patient not taking: No sig reported)     fluticasone (FLONASE) 50 MCG/ACT nasal spray Place 2 sprays into both nostrils daily. (Patient not taking: No sig reported) 18.2 mL 2   guaiFENesin-codeine (ROBITUSSIN AC) 100-10 MG/5ML syrup Take 5 mLs by mouth at bedtime as needed for cough. (Patient not taking: No sig reported) 120 mL 0   nitrofurantoin, macrocrystal-monohydrate, (MACROBID) 100 MG capsule Take 1 capsule (100 mg total) by mouth 2 (two) times daily. (Patient not taking: Reported on 12/23/2020) 6 capsule 0   No facility-administered medications prior to visit.    Allergies  Allergen Reactions   Alendronate Sodium    Other Other (See Comments)    "messes up blood"    Sulfonamide Derivatives     ROS Review of Systems  Constitutional: Negative.   HENT: Negative.    Eyes: Negative.   Respiratory: Negative.    Cardiovascular: Negative.   Gastrointestinal: Negative.   Genitourinary: Negative.   Musculoskeletal: Negative.   Skin: Negative.   Neurological: Negative.   Psychiatric/Behavioral: Negative.    All other systems reviewed and are negative.    Objective:    Physical Exam Vitals and nursing note reviewed.  Constitutional:      General: She is not in acute distress.    Appearance: Normal appearance. She is normal weight. She is not ill-appearing, toxic-appearing or diaphoretic.  Cardiovascular:     Rate and Rhythm: Normal rate and regular rhythm.     Heart sounds: Normal heart sounds. No murmur heard.   No friction rub. No gallop.  Pulmonary:     Effort: Pulmonary effort is normal. No respiratory distress.     Breath sounds: Normal breath sounds. No stridor. No wheezing, rhonchi or rales.  Chest:     Chest wall: No tenderness.  Skin:    General: Skin is warm and dry.  Neurological:     General: No focal deficit present.     Mental Status: She is alert and oriented to person, place, and time. Mental status is at baseline.  Psychiatric:        Mood and  Affect: Mood normal.        Behavior: Behavior normal.        Thought Content: Thought content normal.        Judgment: Judgment normal.    BP 122/70   Pulse 79   Temp 98.2 F (36.8 C) (Temporal)   Resp 18   Ht 5' (1.524 m)   Wt 155 lb 6.4 oz (70.5 kg)   SpO2 100%   BMI 30.35 kg/m  Wt Readings  from Last 3 Encounters:  12/23/20 155 lb 6.4 oz (70.5 kg)  11/04/20 157 lb 9.6 oz (71.5 kg)  03/09/20 156 lb (70.8 kg)     Health Maintenance Due  Topic Date Due   Zoster Vaccines- Shingrix (1 of 2) Never done   COVID-19 Vaccine (4 - Booster for Pfizer series) 05/18/2020    There are no preventive care reminders to display for this patient.  Lab Results  Component Value Date   TSH 1.29 03/09/2020   Lab Results  Component Value Date   WBC 7.9 12/22/2018   HGB 15.9 12/22/2018   HCT 45.9 12/22/2018   MCV 88 12/22/2018   PLT 327 12/22/2018   Lab Results  Component Value Date   NA 140 03/09/2020   K 4.5 03/09/2020   CO2 30 03/09/2020   GLUCOSE 108 (H) 03/09/2020   BUN 16 03/09/2020   CREATININE 0.78 03/09/2020   BILITOT 0.5 03/09/2020   ALKPHOS 97 03/09/2020   AST 19 03/09/2020   ALT 22 03/09/2020   PROT 7.3 03/09/2020   ALBUMIN 4.2 03/09/2020   CALCIUM 9.3 03/09/2020   ANIONGAP 10 08/21/2014   GFR 78.64 03/09/2020   Lab Results  Component Value Date   CHOL 170 02/29/2020   Lab Results  Component Value Date   HDL 62 02/29/2020   Lab Results  Component Value Date   LDLCALC 93 02/29/2020   Lab Results  Component Value Date   TRIG 78 02/29/2020   Lab Results  Component Value Date   CHOLHDL 2.7 02/29/2020   No results found for: HGBA1C    Assessment & Plan:   Problem List Items Addressed This Visit       Cardiovascular and Mediastinum   Essential hypertension   Relevant Medications   atorvastatin (LIPITOR) 20 MG tablet   amLODipine (NORVASC) 10 MG tablet   cetirizine (ZYRTEC ALLERGY) 10 MG tablet   Other Relevant Orders   CBC with  Differential/Platelet   Comprehensive metabolic panel   Hemoglobin A1c     Digestive   Ulcerative colitis ([place])   Relevant Medications   mesalamine (LIALDA) 1.2 g EC tablet   Other Relevant Orders   Hemoglobin A1c   Other Visit Diagnoses     Flu vaccine need    -  Primary   Relevant Orders   Flu Vaccine QUAD High Dose(Fluad) (Completed)   Hemoglobin A1c   Hyperlipidemia, unspecified hyperlipidemia type       Relevant Medications   atorvastatin (LIPITOR) 20 MG tablet   amLODipine (NORVASC) 10 MG tablet   Other Relevant Orders   Hemoglobin A1c   Lipid panel   Environmental allergies       Relevant Medications   cetirizine (ZYRTEC ALLERGY) 10 MG tablet   Other Relevant Orders   Hemoglobin A1c   Sleep disturbance       Relevant Medications   traZODone (DESYREL) 50 MG tablet   Other Relevant Orders   Hemoglobin A1c   TSH   Urinary urgency       Relevant Medications   nitrofurantoin, macrocrystal-monohydrate, (MACROBID) 100 MG capsule   Other Relevant Orders   Hemoglobin A1c   POCT Urinalysis Dipstick   Urine Culture       Meds ordered this encounter  Medications   atorvastatin (LIPITOR) 20 MG tablet    Sig: Take 1 tablet (20 mg total) by mouth daily.    Dispense:  90 tablet    Refill:  3    Patient needs  an appointment.    Order Specific Question:   Supervising Provider    Answer:   Carlota Raspberry, JEFFREY R [[id number]]   amLODipine (NORVASC) 10 MG tablet    Sig: Take 1 tablet (10 mg total) by mouth daily.    Dispense:  90 tablet    Refill:  3    Patient needs an appointment    Order Specific Question:   Supervising Provider    Answer:   Carlota Raspberry, JEFFREY R [[id number]]   cetirizine (ZYRTEC ALLERGY) 10 MG tablet    Sig: Take 1 tablet (10 mg total) by mouth daily.    Dispense:  90 tablet    Refill:  3    Order Specific Question:   Supervising Provider    Answer:   Carlota Raspberry, JEFFREY R [[id number]]   mesalamine (LIALDA) 1.2 g EC tablet    Sig: Take 1-2 tablets (1.2-2.4 g total) by  mouth 2 (two) times daily.    Dispense:  180 tablet    Refill:  0    Refill 12/21/2019    Order Specific Question:   Supervising Provider    Answer:   Carlota Raspberry, JEFFREY R [[id number]]   traZODone (DESYREL) 50 MG tablet    Sig: Take 0.5 tablets (25 mg total) by mouth at bedtime as needed for sleep.    Dispense:  30 tablet    Refill:  3    Order Specific Question:   Supervising Provider    Answer:   Carlota Raspberry, JEFFREY R [[id number]]   nitrofurantoin, macrocrystal-monohydrate, (MACROBID) 100 MG capsule    Sig: Take 1 capsule (100 mg total) by mouth 2 (two) times daily.    Dispense:  10 capsule    Refill:  0    Order Specific Question:   Supervising Provider    Answer:   Carlota Raspberry, JEFFREY R [[id number]]    Follow-up: Return in about 6 months (around 06/22/2021) for HTN, HLD with PCP Dr. Carlota Raspberry.   PLAN Sleep disturbance - try low dose trazodone prn. If ineffective, can increase to 4m po qhs prn. Monitor effect and follow up. Checking TSH. Bp well controlled. Conitnue current regimen. Check BP at work, if low, cut amlodipine ot 539mpo qd.  UTI - treat presumptively with macrobid. Urinalysis and culture sent. Adjust therapy as indicated. Labs collected. Will follow up with the patient as warranted. UC - refill mesalamine HLD - refill atorvastatin, labs collected. Patient encouraged to call clinic with any questions, comments, or concerns.  RiMaximiano CossNP

## 2020-12-24 LAB — URINE CULTURE
MICRO NUMBER:: 12415851
Result:: NO GROWTH
SPECIMEN QUALITY:: ADEQUATE

## 2020-12-26 NOTE — Progress Notes (Signed)
Can call patient -  No growth on culture. If symptoms persist, call clinic.   Thank you  Rich

## 2020-12-27 ENCOUNTER — Telehealth (INDEPENDENT_AMBULATORY_CARE_PROVIDER_SITE_OTHER): Payer: PPO | Admitting: Registered Nurse

## 2020-12-27 ENCOUNTER — Other Ambulatory Visit: Payer: Self-pay

## 2020-12-27 ENCOUNTER — Encounter: Payer: Self-pay | Admitting: Registered Nurse

## 2020-12-27 VITALS — Temp 100.2°F

## 2020-12-27 DIAGNOSIS — R059 Cough, unspecified: Secondary | ICD-10-CM | POA: Diagnosis not present

## 2020-12-27 DIAGNOSIS — J22 Unspecified acute lower respiratory infection: Secondary | ICD-10-CM | POA: Diagnosis not present

## 2020-12-27 MED ORDER — ALBUTEROL SULFATE HFA 108 (90 BASE) MCG/ACT IN AERS: 2.0000 | INHALATION_SPRAY | Freq: Four times a day (QID) | RESPIRATORY_TRACT | 0 refills | Status: DC | PRN

## 2020-12-27 MED ORDER — PREDNISONE 10 MG (21) PO TBPK: ORAL_TABLET | ORAL | 0 refills | Status: DC

## 2020-12-27 MED ORDER — DOXYCYCLINE HYCLATE 100 MG PO TABS: 100.0000 mg | ORAL_TABLET | Freq: Two times a day (BID) | ORAL | 0 refills | Status: DC

## 2020-12-27 NOTE — Progress Notes (Signed)
Telemedicine Encounter- SOAP NOTE Established Patient  This telephone encounter was conducted with the patient's (or proxy's) verbal consent via audio telecommunications: yes  Patient was instructed to have this encounter in a suitably private space; and to only have persons present to whom they give permission to participate. In addition, patient identity was confirmed by use of name plus two identifiers (DOB and address).  I discussed the limitations, risks, security and privacy concerns of performing an evaluation and management service by telephone and the availability of in person appointments. I also discussed with the patient that there may be a patient responsible charge related to this service. The patient expressed understanding and agreed to proceed.  I spent a total of 18 minutes talking with the patient or their proxy.  Patient at home Provider in office  Participants: Kathrin Ruddy, NP and Leda Roys  Chief Complaint  Patient presents with   possible flu    Subjective   Jessica Stephens is a 70 y.o. established patient. Telephone visit today for flu like symptoms  HPI Seen in office Friday for UTI. Given flu shot at that time Feeling fine Saturday, went to work Then Sunday felt off, maybe allergies Monday started with flu like symptoms. Aches, fever of 100.2, coughing like bronchitis. Had pna earlier this year, feels very similar.   Unsure if flu shot is to blame.  Patient Active Problem List   Diagnosis Date Noted   Ulcerative colitis ([place]) 11/04/2020   Personal history of colonic polyps 11/04/2020   Age-related osteoporosis without current pathological fracture 03/11/2020   Pelvic prolapse 03/08/2020   Lower urinary tract symptoms (LUTS) 12/05/2016   Acute UTI 12/05/2016   Flank pain, acute 12/05/2016   Prolonged Q-T interval on ECG 08/21/2014   Lobar pneumonia due to unspecified organism 08/21/2014   CAP (community acquired pneumonia) 08/19/2014    Community acquired pneumonia 08/19/2014   Sepsis ([place]) 08/19/2014   Hypokalemia 08/19/2014   HYPERLIPIDEMIA 05/29/2010   DYSPNEA 05/29/2010   CHEST PAIN, ATYPICAL 05/02/2010   ESOPHAGEAL STRICTURE 04/07/2010   HIATAL HERNIA 04/07/2010   ULCERATIVE PROCTITIS 04/07/2010   ARTHRITIS 04/07/2010   LUQ PAIN 04/07/2010   TRANSAMINASES, SERUM, ELEVATED 04/07/2010   VITAMIN B12 DEFICIENCY 01/30/2008   ANXIETY, CHRONIC 01/29/2008   Essential hypertension 01/29/2008   CHRONIC MAXILLARY SINUSITIS 04/24/2007   G E R D 04/24/2007   UNSPECIFIED OSTEOPOROSIS 04/24/2007   ULCERATIVE COLITIS, LEFT SIDED 02/25/2007    Past Medical History:  Diagnosis Date   Abnormal transaminases    Anxiety    Arthritis    Esophageal stricture    GERD (gastroesophageal reflux disease)    Hiatal hernia    Hyperlipidemia    Hypertension    Maxillary sinusitis    OAB (overactive bladder)    Proctitis    Ulcerative colitis ([place])    left sided   Vitamin B12 deficiency     Current Outpatient Medications  Medication Sig Dispense Refill   albuterol (VENTOLIN HFA) 108 (90 Base) MCG/ACT inhaler Inhale 2 puffs into the lungs every 6 (six) hours as needed for wheezing or shortness of breath. 8 g 0   doxycycline (VIBRA-TABS) 100 MG tablet Take 1 tablet (100 mg total) by mouth 2 (two) times daily. 14 tablet 0   predniSONE (STERAPRED UNI-PAK 21 TAB) 10 MG (21) TBPK tablet Take per package instructions. Do not skip doses. Finish entire supply. 1 each 0   amLODipine (NORVASC) 10 MG tablet Take 1 tablet (10  mg total) by mouth daily. (Patient not taking: Reported on 12/27/2020) 90 tablet 3   atorvastatin (LIPITOR) 20 MG tablet Take 1 tablet (20 mg total) by mouth daily. (Patient not taking: Reported on 12/27/2020) 90 tablet 3   benzonatate (TESSALON PERLES) 100 MG capsule Take 2 capsules (200 mg total) by mouth 3 (three) times daily as needed for cough. (Patient not taking: No sig reported) 20 capsule 0   cetirizine (ZYRTEC  ALLERGY) 10 MG tablet Take 1 tablet (10 mg total) by mouth daily. (Patient not taking: Reported on 12/27/2020) 90 tablet 3   Cholecalciferol (VITAMIN D3 SUPER STRENGTH) 50 MCG (2000 UT) CAPS Take by mouth. (Patient not taking: Reported on 12/27/2020)     esomeprazole (NEXIUM) 20 MG capsule Take 20 mg by mouth daily as needed (indigestion).  (Patient not taking: No sig reported)     fluticasone (FLONASE) 50 MCG/ACT nasal spray Place 2 sprays into both nostrils daily. (Patient not taking: No sig reported) 18.2 mL 2   guaiFENesin-codeine (ROBITUSSIN AC) 100-10 MG/5ML syrup Take 5 mLs by mouth at bedtime as needed for cough. (Patient not taking: No sig reported) 120 mL 0   mesalamine (LIALDA) 1.2 g EC tablet Take 1-2 tablets (1.2-2.4 g total) by mouth 2 (two) times daily. (Patient not taking: Reported on 12/27/2020) 180 tablet 0   Multiple Vitamins-Minerals (VISION VITAMINS) TABS Take 1 tablet by mouth daily. (Patient not taking: Reported on 12/27/2020)     nitrofurantoin, macrocrystal-monohydrate, (MACROBID) 100 MG capsule Take 1 capsule (100 mg total) by mouth 2 (two) times daily. (Patient not taking: Reported on 12/27/2020) 10 capsule 0   olmesartan (BENICAR) 20 MG tablet Take 1 tablet (20 mg total) by mouth daily. (Patient not taking: Reported on 12/27/2020) 90 tablet 0   SHINGRIX injection  (Patient not taking: Reported on 12/27/2020)     traZODone (DESYREL) 50 MG tablet Take 0.5 tablets (25 mg total) by mouth at bedtime as needed for sleep. (Patient not taking: Reported on 12/27/2020) 30 tablet 3   No current facility-administered medications for this visit.    Allergies  Allergen Reactions   Alendronate Sodium    Other Other (See Comments)    "messes up blood"    Sulfonamide Derivatives     Social History   Socioeconomic History   Marital status: Married    Spouse name: Not on file   Number of children: Not on file   Years of education: Not on file   Highest education level: Not on file   Occupational History   Not on file  Tobacco Use   Smoking status: Never   Smokeless tobacco: Never  Vaping Use   Vaping Use: Never used  Substance and Sexual Activity   Alcohol use: Yes    Comment: occasionally   Drug use: No   Sexual activity: Not on file  Other Topics Concern   Not on file  Social History Narrative   Not on file   Social Determinants of Health   Financial Resource Strain: Not on file  Food Insecurity: Not on file  Transportation Needs: Not on file  Physical Activity: Not on file  Stress: Not on file  Social Connections: Not on file  Intimate Partner Violence: Not on file    ROS Per hpi   Objective   Vitals as reported by the patient: Today's Vitals   12/27/20 1231  Temp: 100.2 F (37.9 C)  TempSrc: Temporal    Mad[place] was seen today for possible flu.  Diagnoses and all orders for this visit:  Lower respiratory infection -     doxycycline (VIBRA-TABS) 100 MG tablet; Take 1 tablet (100 mg total) by mouth 2 (two) times daily. -     predniSONE (STERAPRED UNI-PAK 21 TAB) 10 MG (21) TBPK tablet; Take per package instructions. Do not skip doses. Finish entire supply.  Cough -     albuterol (VENTOLIN HFA) 108 (90 Base) MCG/ACT inhaler; Inhale 2 puffs into the lungs every 6 (six) hours as needed for wheezing or shortness of breath.   PLAN Given recent hx of pna will tx with abx in abundance of caution. Doxycycline and prednisone as above Giving albuterol as relief from cough Encourage tylenol 30105m daily in 3-6 divided doses.  Encourage home covid testing. Pt has not yet done this. Voices strongly that she feels she does not have COVID Discussed with patient that this is not related to her flu vaccine but she insists she will not get another flu shot in the future. Patient encouraged to call clinic with any questions, comments, or concerns.  I discussed the assessment and treatment plan with the patient. The patient was provided an opportunity  to ask questions and all were answered. The patient agreed with the plan and demonstrated an understanding of the instructions.   The patient was advised to call back or seek an in-person evaluation if the symptoms worsen or if the condition fails to improve as anticipated.  I provided 22 minutes of non-face-to-face time during this encounter.  RMaximiano Coss NP

## 2020-12-27 NOTE — Progress Notes (Signed)
I connected with  Jessica Stephens on 12/27/20 by a video enabled telemedicine application and verified that I am speaking with the correct person using two identifiers.   I discussed the limitations of evaluation and management by telemedicine. The patient expressed understanding and agreed to proceed.

## 2020-12-28 ENCOUNTER — Ambulatory Visit: Payer: PPO | Admitting: Registered Nurse

## 2021-01-06 ENCOUNTER — Ambulatory Visit (HOSPITAL_COMMUNITY)
Admission: EM | Admit: 2021-01-06 | Discharge: 2021-01-06 | Disposition: A | Discharge status: Home / Self Care | Payer: PPO | Attending: Student | Admitting: Student

## 2021-01-06 ENCOUNTER — Encounter (HOSPITAL_COMMUNITY): Payer: Self-pay

## 2021-01-06 ENCOUNTER — Ambulatory Visit: Payer: PPO | Admitting: Registered Nurse

## 2021-01-06 ENCOUNTER — Other Ambulatory Visit: Payer: Self-pay

## 2021-01-06 DIAGNOSIS — S39012A Strain of muscle, fascia and tendon of lower back, initial encounter: Secondary | ICD-10-CM

## 2021-01-06 LAB — POCT URINALYSIS DIPSTICK, ED / UC
Bilirubin Urine: NEGATIVE
Glucose, UA: NEGATIVE mg/dL
Ketones, ur: NEGATIVE mg/dL
Nitrite: NEGATIVE
Protein, ur: NEGATIVE mg/dL
Specific Gravity, Urine: 1.01 (ref 1.005–1.030)
Urobilinogen, UA: 0.2 mg/dL (ref 0.0–1.0)
pH: 6.5 (ref 5.0–8.0)

## 2021-01-06 MED ORDER — CYCLOBENZAPRINE HCL 10 MG PO TABS: 10.0000 mg | ORAL_TABLET | Freq: Two times a day (BID) | ORAL | 0 refills | Status: DC | PRN

## 2021-01-06 NOTE — ED Provider Notes (Signed)
[place]    CSN: [id number] Arrival date & time: 01/06/21  0945      History   Chief Complaint Chief Complaint  Patient presents with   Back Pain    HPI Jessica Stephens is a 70 y.o. female presenting with lumbar strain x2 days following leaning over to tie her shoes and heavy lifting at work. Medical history as below.  Describes bilateral lumbar paraspinous muscle tenderness and spasms, without radiation.  Denies urinary symptoms. Denies pain shooting down legs, denies numbness in arms/legs, denies weakness in arms/legs, denies saddle anesthesia, denies bowel/bladder incontinence, denies urinary retention, denies constipation. Denies hematuria, dysuria, frequency, urgency, back pain, n/v/d/abd pain, fevers/chills, abdnormal vaginal discharge.    HPI  Past Medical History:  Diagnosis Date   Abnormal transaminases    Anxiety    Arthritis    Esophageal stricture    GERD (gastroesophageal reflux disease)    Hiatal hernia    Hyperlipidemia    Hypertension    Maxillary sinusitis    OAB (overactive bladder)    Proctitis    Ulcerative colitis ([place])    left sided   Vitamin B12 deficiency     Patient Active Problem List   Diagnosis Date Noted   Ulcerative colitis ([place]) 11/04/2020   Personal history of colonic polyps 11/04/2020   Age-related osteoporosis without current pathological fracture 03/11/2020   Pelvic prolapse 03/08/2020   Lower urinary tract symptoms (LUTS) 12/05/2016   Acute UTI 12/05/2016   Flank pain, acute 12/05/2016   Prolonged Q-T interval on ECG 08/21/2014   Lobar pneumonia due to unspecified organism 08/21/2014   CAP (community acquired pneumonia) 08/19/2014   Community acquired pneumonia 08/19/2014   Sepsis ([place]) 08/19/2014   Hypokalemia 08/19/2014   HYPERLIPIDEMIA 05/29/2010   DYSPNEA 05/29/2010   CHEST PAIN, ATYPICAL 05/02/2010   ESOPHAGEAL STRICTURE 04/07/2010   HIATAL HERNIA 04/07/2010   ULCERATIVE PROCTITIS 04/07/2010    ARTHRITIS 04/07/2010   LUQ PAIN 04/07/2010   TRANSAMINASES, SERUM, ELEVATED 04/07/2010   VITAMIN B12 DEFICIENCY 01/30/2008   ANXIETY, CHRONIC 01/29/2008   Essential hypertension 01/29/2008   CHRONIC MAXILLARY SINUSITIS 04/24/2007   G E R D 04/24/2007   UNSPECIFIED OSTEOPOROSIS 04/24/2007   ULCERATIVE COLITIS, LEFT SIDED 02/25/2007    Past Surgical History:  Procedure Laterality Date   CATARACT EXTRACTION     CESAREAN SECTION     nose surg     NOSE SURGERY      OB History   No obstetric history on file.      Home Medications    Prior to Admission medications   Medication Sig Start Date End Date Taking? Authorizing Provider  cyclobenzaprine (FLEXERIL) 10 MG tablet Take 1 tablet (10 mg total) by mouth 2 (two) times daily as needed for muscle spasms. 01/06/21  Yes Hazel Sams, PA-C  albuterol (VENTOLIN HFA) 108 (90 Base) MCG/ACT inhaler Inhale 2 puffs into the lungs every 6 (six) hours as needed for wheezing or shortness of breath. 12/27/20   Maximiano Coss, NP  amLODipine (NORVASC) 10 MG tablet Take 1 tablet (10 mg total) by mouth daily. Patient not taking: Reported on 12/27/2020 12/23/20   Maximiano Coss, NP  atorvastatin (LIPITOR) 20 MG tablet Take 1 tablet (20 mg total) by mouth daily. Patient not taking: Reported on 12/27/2020 12/23/20   Maximiano Coss, NP  benzonatate (TESSALON PERLES) 100 MG capsule Take 2 capsules (200 mg total) by mouth 3 (three) times daily as needed for cough. Patient not taking: No  sig reported 06/27/20   Pearson Forster, NP  cetirizine (ZYRTEC ALLERGY) 10 MG tablet Take 1 tablet (10 mg total) by mouth daily. Patient not taking: Reported on 12/27/2020 12/23/20   Maximiano Coss, NP  Cholecalciferol (VITAMIN D3 SUPER STRENGTH) 50 MCG (2000 UT) CAPS Take by mouth. Patient not taking: Reported on 12/27/2020    [provider]  doxycycline (VIBRA-TABS) 100 MG tablet Take 1 tablet (100 mg total) by mouth 2 (two) times daily. 12/27/20   Maximiano Coss, NP  esomeprazole (NEXIUM) 20 MG capsule Take 20 mg by mouth daily as needed (indigestion).  Patient not taking: No sig reported    [provider]  fluticasone (FLONASE) 50 MCG/ACT nasal spray Place 2 sprays into both nostrils daily. Patient not taking: No sig reported 06/27/20   Pearson Forster, NP  guaiFENesin-codeine [facility]) 100-10 MG/5ML syrup Take 5 mLs by mouth at bedtime as needed for cough. Patient not taking: No sig reported 07/05/20   Maximiano Coss, NP  mesalamine (LIALDA) 1.2 g EC tablet Take 1-2 tablets (1.2-2.4 g total) by mouth 2 (two) times daily. Patient not taking: Reported on 12/27/2020 12/23/20   Maximiano Coss, NP  Multiple Vitamins-Minerals (VISION VITAMINS) TABS Take 1 tablet by mouth daily. Patient not taking: Reported on 12/27/2020    [provider]  nitrofurantoin, macrocrystal-monohydrate, (MACROBID) 100 MG capsule Take 1 capsule (100 mg total) by mouth 2 (two) times daily. Patient not taking: Reported on 12/27/2020 12/23/20   Maximiano Coss, NP  olmesartan (BENICAR) 20 MG tablet Take 1 tablet (20 mg total) by mouth daily. Patient not taking: Reported on 12/27/2020 10/18/20   [name], MD  predniSONE (STERAPRED UNI-PAK 21 TAB) 10 MG (21) TBPK tablet Take per package instructions. Do not skip doses. Finish entire supply. 12/27/20   Maximiano Coss, NP  [facility] injection  11/14/20   [provider]  traZODone (DESYREL) 50 MG tablet Take 0.5 tablets (25 mg total) by mouth at bedtime as needed for sleep. Patient not taking: Reported on 12/27/2020 12/23/20   Maximiano Coss, NP    Family History Family History  Problem Relation Age of Onset   Arthritis Mother    Dementia Mother    Diabetes Father    Hyperlipidemia Father    Hypertension Father     Social History Social History   Tobacco Use   Smoking status: Never   Smokeless tobacco: Never  Vaping Use   Vaping Use: Never used  Substance Use Topics   Alcohol  use: Yes    Comment: occasionally   Drug use: No     Allergies   Alendronate sodium, Other, and Sulfonamide derivatives   Review of Systems Review of Systems  Constitutional:  Negative for chills, fever and unexpected weight change.  Respiratory:  Negative for chest tightness and shortness of breath.   Cardiovascular:  Negative for chest pain and palpitations.  Gastrointestinal:  Negative for abdominal pain, diarrhea, nausea and vomiting.  Genitourinary:  Negative for decreased urine volume, difficulty urinating and frequency.  Musculoskeletal:  Positive for back pain. Negative for arthralgias, gait problem, joint swelling, myalgias, neck pain and neck stiffness.  Skin:  Negative for wound.  Neurological:  Negative for dizziness, tremors, seizures, syncope, facial asymmetry, speech difficulty, weakness, light-headedness, numbness and headaches.  All other systems reviewed and are negative.   Physical Exam Triage Vital Signs ED Triage Vitals  Enc Vitals Group     BP 01/06/21 1028 (!) 141/76  Pulse Rate 01/06/21 1028 76     Resp 01/06/21 1028 19     Temp 01/06/21 1028 98.9 F (37.2 C)     Temp Source 01/06/21 1028 Oral     SpO2 01/06/21 1028 97 %     Weight --      Height --      Head Circumference --      Peak Flow --      Pain Score 01/06/21 1026 8     Pain Loc --      Pain Edu? --      Excl. in [place]? --    No data found.  Updated Vital Signs BP (!) 141/76 (BP Location: Left Arm)   Pulse 76   Temp 98.9 F (37.2 C) (Oral)   Resp 19   SpO2 97%   Visual Acuity Right Eye Distance:   Left Eye Distance:   Bilateral Distance:    Right Eye Near:   Left Eye Near:    Bilateral Near:     Physical Exam Vitals reviewed.  Constitutional:      General: She is not in acute distress.    Appearance: Normal appearance. She is not ill-appearing.  HENT:     Head: Normocephalic and atraumatic.     Comments: Hard of hearing Cardiovascular:     Rate and Rhythm: Normal  rate and regular rhythm.     Heart sounds: Normal heart sounds.  Pulmonary:     Effort: Pulmonary effort is normal.     Breath sounds: Normal breath sounds and air entry.  Abdominal:     Tenderness: There is no abdominal tenderness. There is no right CVA tenderness, left CVA tenderness, guarding or rebound.  Musculoskeletal:     Cervical back: Normal range of motion. No swelling, deformity, signs of trauma, rigidity, spasms, tenderness, bony tenderness or crepitus. No pain with movement.     Thoracic back: No swelling, deformity, signs of trauma, spasms, tenderness or bony tenderness. Normal range of motion. No scoliosis.     Lumbar back: No swelling, deformity, signs of trauma, spasms, tenderness or bony tenderness. Normal range of motion. Negative right straight leg raise test and negative left straight leg raise test. No scoliosis.     Comments: Bilateral lumbar paraspinous muscle tenderness to palpation, pain elicited with flexion and extension lumbar spine.  Gait intact but with pain.  Strength and sensation intact upper and lower extremities.  No saddle anesthesia. No midline spinous tenderness, deformity, stepoff.  Absolutely no other injury, deformity, tenderness, ecchymosis, abrasion.  Neurological:     General: No focal deficit present.     Mental Status: She is alert.     Cranial Nerves: No cranial nerve deficit.  Psychiatric:        Mood and Affect: Mood normal.        Behavior: Behavior normal.        Thought Content: Thought content normal.        Judgment: Judgment normal.     UC Treatments / Results  Labs (all labs ordered are listed, but only abnormal results are displayed) Labs Reviewed  POCT URINALYSIS DIPSTICK, ED / UC - Abnormal; Notable for the following components:      Result Value   Hgb urine dipstick TRACE (*)    Leukocytes,Ua TRACE (*)    All other components within normal limits    EKG   Radiology No results found.  Procedures Procedures  (including critical care time)  Medications Ordered in  UC Medications - No data to display  Initial Impression / Assessment and Plan / UC Course  I have reviewed the triage vital signs and the nursing notes.  Pertinent labs & imaging results that were available during my care of the patient were reviewed by me and considered in my medical decision making (see chart for details).     This patient is a very pleasant 70 y.o. year old female presenting with lumbar strain. No red flag symptoms. Afebrile, nontachycardic, no reproducible abd pain or CVAT.  Flexeril as below. Tylenol/ibuprofen, ROM exercises, heating pad.   Red flag symptoms and ED return precautions discussed. Patient verbalizes understanding and agreement.     Final Clinical Impressions(s) / UC Diagnoses   Final diagnoses:  Strain of lumbar region, initial encounter     Discharge Instructions      -Start the muscle relaxer-Flexeril- up to 3 times daily for muscle spasms and pain.  This can make you drowsy, so take at bedtime or when you do not need to drive or operate machinery. -You can take Tylenol up to 1000 mg 3 times daily, and ibuprofen up to 600 mg 3 times daily with food.  You can take these together, or alternate every 3-4 hours. -Heating pad -Follow-up with PCP if symptoms worsen/persist     ED Prescriptions     Medication Sig Dispense Auth. Provider   cyclobenzaprine (FLEXERIL) 10 MG tablet Take 1 tablet (10 mg total) by mouth 2 (two) times daily as needed for muscle spasms. 20 tablet Hazel Sams, PA-C      PDMP not reviewed this encounter.   Hazel Sams, PA-C 01/06/21 1113

## 2021-01-06 NOTE — ED Triage Notes (Signed)
Pt presents with lower back pain x 2 days.   States as she was putting her shoes on she felt a spasm and states the pain has not gotten better. States it hurts more if she moving around. Pt states she took Tylenol and it helped some.

## 2021-01-06 NOTE — Discharge Instructions (Addendum)
-  Start the muscle relaxer-Flexeril- up to 3 times daily for muscle spasms and pain.  This can make you drowsy, so take at bedtime or when you do not need to drive or operate machinery. -You can take Tylenol up to 1000 mg 3 times daily, and ibuprofen up to 600 mg 3 times daily with food.  You can take these together, or alternate every 3-4 hours. -Heating pad -Follow-up with PCP if symptoms worsen/persist

## 2021-01-13 ENCOUNTER — Other Ambulatory Visit: Payer: Self-pay | Admitting: Family Medicine

## 2021-01-13 DIAGNOSIS — I10 Essential (primary) hypertension: Secondary | ICD-10-CM

## 2021-01-18 ENCOUNTER — Other Ambulatory Visit: Payer: Self-pay | Admitting: Registered Nurse

## 2021-01-18 DIAGNOSIS — R059 Cough, unspecified: Secondary | ICD-10-CM

## 2021-04-13 ENCOUNTER — Other Ambulatory Visit: Payer: Self-pay | Admitting: Family Medicine

## 2021-04-13 DIAGNOSIS — I10 Essential (primary) hypertension: Secondary | ICD-10-CM

## 2021-04-24 ENCOUNTER — Telehealth: Payer: Self-pay | Admitting: Family Medicine

## 2021-04-24 NOTE — Telephone Encounter (Signed)
Left message for patient to call back and schedule Medicare Annual Wellness Visit (AWV) in office.   If not able to come in office, please offer to do virtually or by telephone.  Left office number and my jabber [phone].  Last AWV:02/29/2020  Please schedule at anytime with Nurse Health Advisor.

## 2021-06-26 ENCOUNTER — Ambulatory Visit (INDEPENDENT_AMBULATORY_CARE_PROVIDER_SITE_OTHER): Payer: PPO | Admitting: Family Medicine

## 2021-06-26 VITALS — BP 138/76 | HR 71 | Temp 98.1°F | Resp 17 | Ht 60.0 in | Wt 153.2 lb

## 2021-06-26 DIAGNOSIS — R42 Dizziness and giddiness: Secondary | ICD-10-CM | POA: Diagnosis not present

## 2021-06-26 DIAGNOSIS — E785 Hyperlipidemia, unspecified: Secondary | ICD-10-CM

## 2021-06-26 DIAGNOSIS — H6123 Impacted cerumen, bilateral: Secondary | ICD-10-CM | POA: Diagnosis not present

## 2021-06-26 DIAGNOSIS — I10 Essential (primary) hypertension: Secondary | ICD-10-CM

## 2021-06-26 DIAGNOSIS — L299 Pruritus, unspecified: Secondary | ICD-10-CM | POA: Diagnosis not present

## 2021-06-26 DIAGNOSIS — H9190 Unspecified hearing loss, unspecified ear: Secondary | ICD-10-CM

## 2021-06-26 MED ORDER — OLMESARTAN MEDOXOMIL 20 MG PO TABS: 20.0000 mg | ORAL_TABLET | Freq: Every day | ORAL | 3 refills | Status: DC

## 2021-06-26 MED ORDER — AMLODIPINE BESYLATE 10 MG PO TABS: 10.0000 mg | ORAL_TABLET | Freq: Every day | ORAL | 3 refills | Status: DC

## 2021-06-26 MED ORDER — TRIAMCINOLONE ACETONIDE 0.1 % EX CREA: 1.0000 "application " | TOPICAL_CREAM | Freq: Two times a day (BID) | CUTANEOUS | 1 refills | Status: DC

## 2021-06-26 MED ORDER — ATORVASTATIN CALCIUM 20 MG PO TABS: 20.0000 mg | ORAL_TABLET | Freq: Every day | ORAL | 3 refills | Status: DC

## 2021-06-26 NOTE — Patient Instructions (Addendum)
Make sure to drink plenty of fluids throughout the day - water is best.  ?Pause after standing, and sit on edge of bed first before getting up in the morning.  ?Keep a record of your blood pressures outside of the office and bring them to the next office visit. ?If dizziness not improving, return for recheck.  ? ?No change in meds for now.  ? ?Keep follow-up with your specialist as planned.  Apply the steroid prescription cream from today up to twice per day to the itching areas, but also apply Eucerin lotion or Aveeno lotion to the outer part of the ear where the itching, soreness occurs.  If any discharge or worsening pain be seen right away to make sure there is not an infection.  Does not appear to be infected today. ? ?Return to the clinic or go to the nearest emergency room if any of your symptoms worsen or new symptoms occur. ? ?Ear Irrigation ?Ear irrigation is a procedure to wash dirt and wax out of your ear canal. This procedure is also called lavage. You may need ear irrigation if you are having trouble hearing because of a buildup of earwax. You may also have ear irrigation as part of the treatment for an ear infection. Getting wax and dirt out of your ear canal can help ear drops work better. ?Tell a health care provider about: ?Any allergies you have. ?All medicines you are taking, including vitamins, herbs, eye drops, creams, and over-the-counter medicines. ?Any problems you or family members have had with anesthetic medicines. ?Any blood disorders you have. ?Any surgeries you have had. This includes any ear surgeries. ?Any medical conditions you have. ?Whether you are pregnant or may be pregnant. ?What are the risks? ?Generally, this is a safe procedure. However, problems may occur, including: ?Infection. ?Pain. ?Hearing loss. ?Fluid and debris being pushed through the eardrum and into the middle ear. This can occur if there are holes in the eardrum. ?Ear irrigation failing to work. ?What happens  before the procedure? ?You will talk with your provider about the procedure and plan. ?You may be given ear drops to put in your ear 15-20 minutes before irrigation. This helps loosen the wax. ?What happens during the procedure? ? ?A syringe is filled with water or saline solution, which is made of salt and water. ?The syringe is gently inserted into the ear canal. ?The fluid is used to flush out wax and other debris. ?The procedure may vary among health care providers and hospitals. ?What can I expect after the procedure? ?After an ear irrigation, follow instructions given to you by your health care provider. ?Follow these instructions at home: ?Using ear irrigation kits ?Ear irrigation kits are available for use at home. Ask your health care provider if this is an option for you. In general, you should: ?Use a home irrigation kit only as told by your health care provider. ?Read the package instructions carefully. ?Follow the directions for using the syringe. ?Use water that is room temperature. ?Do not do ear irrigation at home if you: ?Have diabetes. Diabetes increases the risk of infection. ?Have a hole or tear in your eardrum. ?Have tubes in your ears. ?Have had any ear surgery in the past. ?Have been told not to irrigate your ears. ?Cleaning your ears ? ?Clean the outside of your ear with a soft washcloth daily. ?If told by your health care provider, use a few drops of baby oil, mineral oil, glycerin, hydrogen peroxide, or over-the-counter earwax  softening drops. ?Do not use cotton swabs to clean your ears. These can push wax down into the ear canal. ?Do not put anything into your ears to try to remove wax. This includes ear candles. ?General instructions ?Take over-the-counter and prescription medicines only as told by your health care provider. ?If you were prescribed an antibiotic medicine, use it as told by your health care provider. Do not stop using the antibiotic even if your condition improves. ?Keep  the ear clean and dry by following the instructions from your health care provider. ?Keep all follow-up visits. This is important. ?Visit your health care provider at least once a year to have your ears and hearing checked. ?Contact a health care provider if: ?Your hearing is not improving or is getting worse. ?You have pain or redness in your ear. ?You are dizzy. ?You have ringing in your ears. ?You have nausea or vomiting. ?You have fluid, blood, or pus coming out of your ear. ?Summary ?Ear irrigation is a procedure to wash dirt and wax out of your ear canal. This procedure is also called lavage. ?To perform ear irrigation, ear drops may be put in your ear 15-20 minutes before irrigation. Water or saline solution will be used to flush out earwax and other debris. ?You may be able to irrigate your ears at home. Ask your health care provider if this is an option for you. Follow your health care provider's instructions. ?Clean your ears with a soft cloth after irrigation. Do not use cotton swabs to clean your ears. These can push wax down into the ear canal. ?This information is not intended to replace advice given to you by your health care provider. Make sure you discuss any questions you have with your health care provider. ?Document Revised: 07/07/2019 Document Reviewed: 07/07/2019 ?Elsevier Patient Education ? 2022 [employer]. ? ? ?

## 2021-06-26 NOTE — Progress Notes (Signed)
? ?Subjective:  ?Patient ID: Jessica Stephens, female    DOB: 03/06/1951  Age: 71 y.o. MRN: [id number] ? ?CC:  ?Chief Complaint  ?Patient presents with  ? Hypertension  ?  Pt here for recheck no concerns   ? Hyperlipidemia  ?  Pt due for recheck on her labs   ? Ear Problem  ?  Pt notes itching and dry skin in her ears and is wondering if she needs them cleaned out or if theres something more she should be examined for, notes hearing loss, notes this has been an issue for about a year does see Dr Redmond Baseman and has gotten creams from him previously did not return for her follow up bc this went away, notes she is too busy to go back to see them due to work now   ? ? ?HPI ?Jessica Stephens presents for  ? ?Hypertension: ?Treated with Benicar 20 mg daily, amlodipine 10 mg daily ?Home readings: none recently.  ?Occasional dizziness if moving too fast only.  ?BP Readings from Last 3 Encounters:  ?06/26/21 138/76  ?01/06/21 (!) 141/76  ?12/23/20 122/70  ? ?Lab Results  ?Component Value Date  ? CREATININE 0.78 03/09/2020  ? ?Hyperlipidemia: ?Lipitor 20 mg daily. No new side effects/myalgias.  ?Lab Results  ?Component Value Date  ? CHOL 170 02/29/2020  ? HDL 62 02/29/2020  ? Muddy 93 02/29/2020  ? TRIG 78 02/29/2020  ? CHOLHDL 2.7 02/29/2020  ? ?Lab Results  ?Component Value Date  ? ALT 22 03/09/2020  ? AST 19 03/09/2020  ? ALKPHOS 97 03/09/2020  ? BILITOT 0.5 03/09/2020  ? ?Ear itching ?Previous patient of Dr. Redmond Baseman with ENT, no recent visit. Both ears for months. ?Scratching at ears. Using qtips to itch area. Hearing decreased past year. Appt planned in next month with Dr. Redmond Baseman.  Previously Rx Alclovate topical cream - eased the itch, but came back.  ?Tx: eucerin? - min relief temporarily.  ? ?History ?Patient Active Problem List  ? Diagnosis Date Noted  ? Ulcerative colitis ([place]) 11/04/2020  ? Personal history of colonic polyps 11/04/2020  ? Age-related osteoporosis without current pathological fracture 03/11/2020  ? Pelvic  prolapse 03/08/2020  ? Lower urinary tract symptoms (LUTS) 12/05/2016  ? Acute UTI 12/05/2016  ? Flank pain, acute 12/05/2016  ? Prolonged Q-T interval on ECG 08/21/2014  ? Lobar pneumonia due to unspecified organism 08/21/2014  ? CAP (community acquired pneumonia) 08/19/2014  ? Community acquired pneumonia 08/19/2014  ? Sepsis ([place]) 08/19/2014  ? Hypokalemia 08/19/2014  ? HYPERLIPIDEMIA 05/29/2010  ? DYSPNEA 05/29/2010  ? CHEST PAIN, ATYPICAL 05/02/2010  ? ESOPHAGEAL STRICTURE 04/07/2010  ? HIATAL HERNIA 04/07/2010  ? ULCERATIVE PROCTITIS 04/07/2010  ? ARTHRITIS 04/07/2010  ? LUQ PAIN 04/07/2010  ? TRANSAMINASES, SERUM, ELEVATED 04/07/2010  ? VITAMIN B12 DEFICIENCY 01/30/2008  ? ANXIETY, CHRONIC 01/29/2008  ? Essential hypertension 01/29/2008  ? CHRONIC MAXILLARY SINUSITIS 04/24/2007  ? G E R D 04/24/2007  ? UNSPECIFIED OSTEOPOROSIS 04/24/2007  ? ULCERATIVE COLITIS, LEFT SIDED 02/25/2007  ? ?Past Medical History:  ?Diagnosis Date  ? Abnormal transaminases   ? Anxiety   ? Arthritis   ? Esophageal stricture   ? GERD (gastroesophageal reflux disease)   ? Hiatal hernia   ? Hyperlipidemia   ? Hypertension   ? Maxillary sinusitis   ? OAB (overactive bladder)   ? Proctitis   ? Ulcerative colitis ([place])   ? left sided  ? Vitamin B12 deficiency   ? ?Past Surgical  History:  ?Procedure Laterality Date  ? CATARACT EXTRACTION    ? CESAREAN SECTION    ? nose surg    ? NOSE SURGERY    ? ?Allergies  ?Allergen Reactions  ? Alendronate Sodium   ? Other Other (See Comments)  ?  "messes up blood" ?  ? Sulfonamide Derivatives   ? ?Prior to Admission medications   ?Medication Sig Start Date End Date Taking? Authorizing Provider  ?amLODipine (NORVASC) 10 MG tablet Take 1 tablet (10 mg total) by mouth daily. 12/23/20  Yes Maximiano Coss, NP  ?atorvastatin (LIPITOR) 20 MG tablet Take 1 tablet (20 mg total) by mouth daily. 12/23/20  Yes Maximiano Coss, NP  ?cetirizine (ZYRTEC ALLERGY) 10 MG tablet Take 1 tablet (10 mg total) by mouth daily.  12/23/20  Yes Maximiano Coss, NP  ?mesalamine (LIALDA) 1.2 g EC tablet Take 1-2 tablets (1.2-2.4 g total) by mouth 2 (two) times daily. 12/23/20  Yes Maximiano Coss, NP  ?Multiple Vitamins-Minerals (VISION VITAMINS) TABS Take 1 tablet by mouth daily.   Yes [provider]  ?olmesartan (BENICAR) 20 MG tablet TAKE 1 TABLET BY MOUTH EVERY DAY 04/13/21  Yes [name], MD  ? ?Social History  ? ?Socioeconomic History  ? Marital status: Married  ?  Spouse name: Not on file  ? Number of children: Not on file  ? Years of education: Not on file  ? Highest education level: Not on file  ?Occupational History  ? Not on file  ?Tobacco Use  ? Smoking status: Never  ? Smokeless tobacco: Never  ?Vaping Use  ? Vaping Use: Never used  ?Substance and Sexual Activity  ? Alcohol use: Yes  ?  Comment: occasionally  ? Drug use: No  ? Sexual activity: Not on file  ?Other Topics Concern  ? Not on file  ?Social History Narrative  ? Not on file  ? ?Social Determinants of Health  ? ?Financial Resource Strain: Not on file  ?Food Insecurity: Not on file  ?Transportation Needs: Not on file  ?Physical Activity: Not on file  ?Stress: Not on file  ?Social Connections: Not on file  ?Intimate Partner Violence: Not on file  ? ? ?Review of Systems  ?Constitutional:  Negative for fatigue and unexpected weight change.  ?Respiratory:  Negative for chest tightness and shortness of breath.   ?Cardiovascular:  Negative for chest pain, palpitations and leg swelling.  ?Gastrointestinal:  Negative for abdominal pain and blood in stool.  ?Neurological:  Negative for dizziness, syncope, light-headedness and headaches.  ? ? ?Objective:  ? ?Vitals:  ? 06/26/21 1320  ?BP: 138/76  ?Pulse: 71  ?Resp: 17  ?Temp: 98.1 ?F (36.7 ?C)  ?TempSrc: Temporal  ?SpO2: 97%  ?Weight: 153 lb 3.2 oz (69.5 kg)  ?Height: 5' (1.524 m)  ? ? ? ?Physical Exam ?Vitals reviewed.  ?Constitutional:   ?   Appearance: Normal appearance. She is well-developed.  ?HENT:  ?   Head:  Normocephalic and atraumatic.  ?   Right Ear: There is impacted cerumen.  ?   Left Ear: There is impacted cerumen.  ?   Ears:  ?   Comments: Bilateral impacted cerumen.  Outer ear canals with excoriation, dry/cracked skin.  No discharge or appreciable surrounding erythema. ?Eyes:  ?   Conjunctiva/sclera: Conjunctivae normal.  ?   Pupils: Pupils are equal, round, and reactive to light.  ?Neck:  ?   Vascular: No carotid bruit.  ?Cardiovascular:  ?   Rate and Rhythm: Normal rate  and regular rhythm.  ?   Heart sounds: Normal heart sounds.  ?Pulmonary:  ?   Effort: Pulmonary effort is normal.  ?   Breath sounds: Normal breath sounds.  ?Abdominal:  ?   Palpations: Abdomen is soft. There is no pulsatile mass.  ?   Tenderness: There is no abdominal tenderness.  ?Musculoskeletal:  ?   Right lower leg: No edema.  ?   Left lower leg: No edema.  ?Skin: ?   General: Skin is warm and dry.  ?Neurological:  ?   Mental Status: She is alert and oriented to person, place, and time.  ?Psychiatric:     ?   Mood and Affect: Mood normal.     ?   Behavior: Behavior normal.  ? ? ?After ear lavage, bilateral canals clear without any wounds, TMs intact ? ? ?Assessment & Plan:  ?Jessica Stephens is a 71 y.o. female . ?Essential hypertension - Plan: olmesartan (BENICAR) 20 MG tablet, amLODipine (NORVASC) 10 MG tablet ?Episodic lightheadedness -  ? -Hypertension controlled, continue same regimen.  Likely orthostatic symptoms with rare fleeting lightheadedness.  Increase hydration discussed as well as orthostatic precautions, RTC precautions. ? ?Hyperlipidemia, unspecified hyperlipidemia type - Plan: atorvastatin (LIPITOR) 20 MG tablet ? - Stable, tolerating current regimen. Medications refilled. Labs pending as above.  ? ?Itching of ear - Plan: triamcinolone cream (KENALOG) 0.1 % ?Hearing loss, unspecified hearing loss type, unspecified laterality ?Bilateral impacted cerumen - Plan: Ear wax removal ? -Cerumen impaction may be contributing to  hearing loss, ongoing symptoms, has follow-up with ENT as planned.  Hearing improved after cerumen removal by lavage. ? -Likely component of eczema with outer ear involvement.  Triamcinolone plus Eucerin di

## 2021-06-30 ENCOUNTER — Other Ambulatory Visit: Payer: Self-pay

## 2021-06-30 MED ORDER — FLUTICASONE PROPIONATE 50 MCG/ACT NA SUSP: 2.0000 | Freq: Every day | NASAL | 2 refills | Status: DC

## 2021-07-25 DIAGNOSIS — R519 Headache, unspecified: Secondary | ICD-10-CM | POA: Insufficient documentation

## 2021-07-25 DIAGNOSIS — H60543 Acute eczematoid otitis externa, bilateral: Secondary | ICD-10-CM | POA: Diagnosis not present

## 2021-08-08 ENCOUNTER — Encounter: Payer: Self-pay | Admitting: Registered Nurse

## 2021-08-08 ENCOUNTER — Other Ambulatory Visit: Payer: Self-pay

## 2021-08-08 ENCOUNTER — Ambulatory Visit (INDEPENDENT_AMBULATORY_CARE_PROVIDER_SITE_OTHER): Payer: PPO | Admitting: Registered Nurse

## 2021-08-08 VITALS — BP 155/82 | HR 74 | Temp 98.4°F | Resp 18 | Ht 60.0 in | Wt 153.0 lb

## 2021-08-08 DIAGNOSIS — J0110 Acute frontal sinusitis, unspecified: Secondary | ICD-10-CM | POA: Diagnosis not present

## 2021-08-08 DIAGNOSIS — R051 Acute cough: Secondary | ICD-10-CM

## 2021-08-08 MED ORDER — HYDROCODONE BIT-HOMATROP MBR 5-1.5 MG/5ML PO SOLN: 5.0000 mL | Freq: Every evening | ORAL | 0 refills | Status: DC | PRN

## 2021-08-08 MED ORDER — PREDNISONE 10 MG (21) PO TBPK: ORAL_TABLET | ORAL | 0 refills | Status: DC

## 2021-08-08 MED ORDER — BENZONATATE 100 MG PO CAPS: 100.0000 mg | ORAL_CAPSULE | Freq: Three times a day (TID) | ORAL | 0 refills | Status: DC | PRN

## 2021-08-08 NOTE — Patient Instructions (Addendum)
Jessica Stephens -  ? ?Great to see you ? ?Continue with amoxicillin ? ?Add prednisone, benzonatate, and hycodan cough syrup ? ?Call if you're not any better by Friday at noon. ? ?Thank you ? ?Rich  ? ? ?If you have lab work done today you will be contacted with your lab results within the next 2 weeks.  If you have not heard from [place] then please contact us. The fastest way to get your results is to register for My Chart. ? ? ?IF you received an x-ray today, you will receive an invoice from [facility]. Please contact [facility] at [phone] with questions or concerns regarding your invoice.  ? ?IF you received labwork today, you will receive an invoice from [place]. Please contact [facility] at [phone] with questions or concerns regarding your invoice.  ? ?Our billing staff will not be able to assist you with questions regarding bills from these companies. ? ?You will be contacted with the lab results as soon as they are available. The fastest way to get your results is to activate your My Chart account. Instructions are located on the last page of this paperwork. If you have not heard from [place] regarding the results in 2 weeks, please contact this office. ?  ? ? ?

## 2021-08-08 NOTE — Progress Notes (Signed)
? ?Acute Office Visit ? ?Subjective:  ? ? Patient ID: Jessica Stephens, female    DOB: 1950-05-02, 71 y.o.   MRN: [id number] ? ?Chief Complaint  ?Patient presents with  ? Cough  ?  Patient states she has been experiencing some coughing, ear pain, sore throat and head pressure. Patient states she has started coughing up yellow phlegm. She has been taking OTC medications.  ? ? ?HPI ?Patient is in today for cough ? ?Onset one week ago with coughing, ear pressure and pain, sore throat, sinus pressure ?She has now started producing a lot of yellow mucus ?Has been taking OTC meds without relief. ? ?Had been seen by ENT Dr. Redmond Baseman on 4/25, who had given her amoxicillin in case of worsening symptoms. ?She had started taking this on Sunday evening.  ? ?Outpatient Medications Prior to Visit  ?Medication Sig Dispense Refill  ? amLODipine (NORVASC) 10 MG tablet Take 1 tablet (10 mg total) by mouth daily. 90 tablet 3  ? atorvastatin (LIPITOR) 20 MG tablet Take 1 tablet (20 mg total) by mouth daily. 90 tablet 3  ? cetirizine (ZYRTEC ALLERGY) 10 MG tablet Take 1 tablet (10 mg total) by mouth daily. 90 tablet 3  ? fluticasone (FLONASE) 50 MCG/ACT nasal spray Place 2 sprays into both nostrils daily. 18.2 mL 2  ? mesalamine (LIALDA) 1.2 g EC tablet Take 1-2 tablets (1.2-2.4 g total) by mouth 2 (two) times daily. 180 tablet 0  ? Multiple Vitamins-Minerals (VISION VITAMINS) TABS Take 1 tablet by mouth daily.    ? olmesartan (BENICAR) 20 MG tablet Take 1 tablet (20 mg total) by mouth daily. 90 tablet 3  ? triamcinolone cream (KENALOG) 0.1 % Apply 1 application. topically 2 (two) times daily. 30 g 1  ? ?No facility-administered medications prior to visit.  ? ? ?Review of Systems  ?Constitutional: Negative.   ?HENT:  Positive for congestion, sinus pressure and sore throat.   ?Eyes: Negative.   ?Respiratory:  Positive for cough.   ?Cardiovascular: Negative.   ?Gastrointestinal: Negative.   ?Endocrine: Negative.   ?Genitourinary: Negative.    ?Musculoskeletal: Negative.   ?Skin: Negative.   ?Allergic/Immunologic: Negative.   ?Neurological: Negative.   ?Hematological: Negative.   ?Psychiatric/Behavioral: Negative.    ?All other systems reviewed and are negative. ? ?   ?Objective:  ?  ?BP (!) 155/82   Pulse 74   Temp 98.4 ?F (36.9 ?C) (Temporal)   Resp 18   Ht 5' (1.524 m)   Wt 153 lb (69.4 kg)   SpO2 99%   BMI 29.88 kg/m?  ?Physical Exam ?Vitals and nursing note reviewed.  ?Constitutional:   ?   General: She is not in acute distress. ?   Appearance: Normal appearance. She is normal weight. She is not ill-appearing, toxic-appearing or diaphoretic.  ?HENT:  ?   Nose: Congestion and rhinorrhea present.  ?   Mouth/Throat:  ?   Mouth: Mucous membranes are moist.  ?   Pharynx: Oropharynx is clear. Posterior oropharyngeal erythema present.  ?Cardiovascular:  ?   Rate and Rhythm: Normal rate and regular rhythm.  ?   Heart sounds: Normal heart sounds. No murmur heard. ?  No friction rub. No gallop.  ?Pulmonary:  ?   Effort: Pulmonary effort is normal. No respiratory distress.  ?   Breath sounds: Normal breath sounds. No stridor. No wheezing, rhonchi or rales.  ?Chest:  ?   Chest wall: No tenderness.  ?Skin: ?   General: Skin is warm  and dry.  ?Neurological:  ?   General: No focal deficit present.  ?   Mental Status: She is alert and oriented to person, place, and time. Mental status is at baseline.  ?Psychiatric:     ?   Mood and Affect: Mood normal.     ?   Behavior: Behavior normal.     ?   Thought Content: Thought content normal.     ?   Judgment: Judgment normal.  ? ? ?No results found for any visits on 08/08/21. ? ? ?   ?Assessment & Plan:  ?1. Acute non-recurrent frontal sinusitis ?- predniSONE (STERAPRED UNI-PAK 21 TAB) 10 MG (21) TBPK tablet; Take per package instructions. Do not skip doses. Finish entire supply.  Dispense: 1 each; Refill: 0 ? ?2. Acute cough ?- HYDROcodone bit-homatropine (HYCODAN) 5-1.5 MG/5ML syrup; Take 5 mLs by mouth at  bedtime as needed for cough.  Dispense: 120 mL; Refill: 0 ?- benzonatate (TESSALON) 100 MG capsule; Take 1 capsule (100 mg total) by mouth 3 (three) times daily as needed for cough.  Dispense: 20 capsule; Refill: 0 ? ? ? ?Meds ordered this encounter  ?Medications  ? predniSONE (STERAPRED UNI-PAK 21 TAB) 10 MG (21) TBPK tablet  ?  Sig: Take per package instructions. Do not skip doses. Finish entire supply.  ?  Dispense:  1 each  ?  Refill:  0  ?  Order Specific Question:   Supervising Provider  ?  Answer:   Carlota Raspberry, JEFFREY R [[id number]]  ? HYDROcodone bit-homatropine (HYCODAN) 5-1.5 MG/5ML syrup  ?  Sig: Take 5 mLs by mouth at bedtime as needed for cough.  ?  Dispense:  120 mL  ?  Refill:  0  ?  Order Specific Question:   Supervising Provider  ?  Answer:   Carlota Raspberry, JEFFREY R [[id number]]  ? benzonatate (TESSALON) 100 MG capsule  ?  Sig: Take 1 capsule (100 mg total) by mouth 3 (three) times daily as needed for cough.  ?  Dispense:  20 capsule  ?  Refill:  0  ?  Order Specific Question:   Supervising Provider  ?  Answer:   Carlota Raspberry, JEFFREY R [[id number]]  ? ? ?Return if symptoms worsen or fail to improve. ? ?PLAN ?Continue amoxicillin, ?Start prednisone ?Add tessalon for daytime cough relief, hycodan for nighttime cough ?Patient encouraged to call clinic with any questions, comments, or concerns. ? ?Maximiano Coss, NP ?

## 2021-08-10 ENCOUNTER — Encounter: Payer: Self-pay | Admitting: Family Medicine

## 2021-08-10 ENCOUNTER — Ambulatory Visit (INDEPENDENT_AMBULATORY_CARE_PROVIDER_SITE_OTHER)
Admission: RE | Admit: 2021-08-10 | Discharge: 2021-08-10 | Disposition: A | Discharge status: Home / Self Care | Payer: PPO | Source: Ambulatory Visit | Attending: Family Medicine | Admitting: Family Medicine

## 2021-08-10 ENCOUNTER — Telehealth: Payer: Self-pay

## 2021-08-10 ENCOUNTER — Ambulatory Visit (INDEPENDENT_AMBULATORY_CARE_PROVIDER_SITE_OTHER): Payer: PPO | Admitting: Family Medicine

## 2021-08-10 VITALS — BP 138/88 | HR 78 | Temp 98.1°F | Resp 18 | Ht 61.5 in | Wt 154.8 lb

## 2021-08-10 DIAGNOSIS — R059 Cough, unspecified: Secondary | ICD-10-CM | POA: Diagnosis not present

## 2021-08-10 LAB — POC COVID19 BINAXNOW: SARS Coronavirus 2 Ag: NEGATIVE

## 2021-08-10 MED ORDER — DOXYCYCLINE HYCLATE 100 MG PO TABS: 100.0000 mg | ORAL_TABLET | Freq: Two times a day (BID) | ORAL | 0 refills | Status: DC

## 2021-08-10 MED ORDER — ALBUTEROL SULFATE HFA 108 (90 BASE) MCG/ACT IN AERS: 2.0000 | INHALATION_SPRAY | Freq: Four times a day (QID) | RESPIRATORY_TRACT | 0 refills | Status: DC | PRN

## 2021-08-10 NOTE — Telephone Encounter (Signed)
Error

## 2021-08-10 NOTE — Patient Instructions (Addendum)
Follow up as needed or as scheduled ?GO TO [facility] and get your chest xray ?STOP the Amoxicillin ?START the Doxycycline twice daily- take w/ food ?USE the Albuterol inhaler- 2 puffs every 4 hrs as needed for coughing fits or shortness of breath ?CONTINUE the cough syrup and prednisone as directed ?Call with any questions or concerns ?Hang in there!!! ?

## 2021-08-10 NOTE — Progress Notes (Signed)
   Subjective:    Patient ID: Jessica Stephens, female    DOB: January 13, 1951, 71 y.o.   MRN: [id number]  HPI Cough- pt was seen by ENT on 4/25 and started on abx for sinus sxs.  States she didn't start abx until 2 days ago.  Saw Rich on 5/9 and was told to finish Amox and was given prednisone, hydrocodone cough syrup, and tessalon.  Pt takes daily allergy medications.  'i have a weak point for bronchitis'  Pt reports cough has worsened.  When she coughs she has urinary incontinence- 'i slept on the commode'.  + SOB w/ coughing jags.  Post tussive nausea but no vomiting.  No fever.  + chills.  + body aches.     Review of Systems For ROS see HPI     Objective:   Physical Exam Vitals reviewed.  Constitutional:      General: She is not in acute distress.    Appearance: She is well-developed. She is not ill-appearing.  HENT:     Head: Normocephalic and atraumatic.  Eyes:     Conjunctiva/sclera: Conjunctivae normal.     Pupils: Pupils are equal, round, and reactive to light.  Cardiovascular:     Rate and Rhythm: Normal rate and regular rhythm.     Heart sounds: Normal heart sounds. No murmur heard. Pulmonary:     Effort: Pulmonary effort is normal. No respiratory distress.     Breath sounds: Wheezing (expiratory wheezes diffusely) present.     Comments: Coarse BS throughout + deep, hacking cough Musculoskeletal:     Cervical back: Normal range of motion and neck supple.  Lymphadenopathy:     Cervical: No cervical adenopathy.  Skin:    General: Skin is warm and dry.  Neurological:     General: No focal deficit present.     Mental Status: She is alert and oriented to person, place, and time.  Psychiatric:        Mood and Affect: Mood normal.        Behavior: Behavior normal.          Assessment & Plan:  Cough- new to provider, worsening for pt.  She was supposed to start Abx on 4/25 but did not actually take them until 2 days ago.  Since sxs are worsening will get CXR to assess  for possible PNA.  Will switch to Doxy 151m BID x10 days.  She was given albuterol inhaler to help w/ cough, wheezing, and SOB.  She was instructed to finish the Prednisone as directed and continue the cough syrup prn.  Reviewed supportive care and red flags that should prompt return.  Pt expressed understanding and is in agreement w/ plan.

## 2021-08-11 ENCOUNTER — Telehealth: Payer: Self-pay

## 2021-08-11 NOTE — Telephone Encounter (Signed)
Spoke w/ pt and advised pt of chest xray results. She expressed verbal understanding  ?

## 2021-08-11 NOTE — Telephone Encounter (Signed)
-----   Message from Midge Minium, MD sent at 08/11/2021  7:29 AM EDT ----- ?Normal chest xray- this is great news!  Take the antibiotics, use the inhaler, and take the prednisone as directed.  If you need more time out of work, that note is very flexible as it says you can return when fever free.  That means you can use your judgement based on how you're feeling ?

## 2021-08-17 ENCOUNTER — Ambulatory Visit: Payer: PPO | Admitting: Family Medicine

## 2021-08-22 ENCOUNTER — Encounter: Payer: Self-pay | Admitting: Registered Nurse

## 2021-08-22 ENCOUNTER — Other Ambulatory Visit: Payer: Self-pay

## 2021-08-22 ENCOUNTER — Ambulatory Visit (INDEPENDENT_AMBULATORY_CARE_PROVIDER_SITE_OTHER): Payer: PPO | Admitting: Registered Nurse

## 2021-08-22 VITALS — BP 138/64 | HR 79 | Temp 98.3°F | Resp 18 | Ht 61.5 in

## 2021-08-22 DIAGNOSIS — R052 Subacute cough: Secondary | ICD-10-CM

## 2021-08-22 DIAGNOSIS — R0981 Nasal congestion: Secondary | ICD-10-CM | POA: Diagnosis not present

## 2021-08-22 MED ORDER — AZELASTINE HCL 0.1 % NA SOLN: 1.0000 | Freq: Two times a day (BID) | NASAL | 12 refills | Status: DC

## 2021-08-22 MED ORDER — DM-GUAIFENESIN ER 30-600 MG PO TB12: 1.0000 | ORAL_TABLET | Freq: Two times a day (BID) | ORAL | 0 refills | Status: DC

## 2021-08-22 MED ORDER — MONTELUKAST SODIUM 10 MG PO TABS: 10.0000 mg | ORAL_TABLET | Freq: Every day | ORAL | 3 refills | Status: DC

## 2021-08-22 NOTE — Patient Instructions (Addendum)
Ms. Girtie Wiersma to see you!  Call if you don't get better within 10 days.  We can consider ENT or Allergy specialist referral  Thank you,  Rich     If you have lab work done today you will be contacted with your lab results within the next 2 weeks.  If you have not heard from [place] then please contact us. The fastest way to get your results is to register for My Chart.   IF you received an x-ray today, you will receive an invoice from [facility]. Please contact [facility] at [phone] with questions or concerns regarding your invoice.   IF you received labwork today, you will receive an invoice from [place]. Please contact [facility] at [phone] with questions or concerns regarding your invoice.   Our billing staff will not be able to assist you with questions regarding bills from these companies.  You will be contacted with the lab results as soon as they are available. The fastest way to get your results is to activate your My Chart account. Instructions are located on the last page of this paperwork. If you have not heard from [place] regarding the results in 2 weeks, please contact this office.

## 2021-08-22 NOTE — Progress Notes (Signed)
Acute Office Visit  Subjective:    Patient ID: Jessica Stephens, female    DOB: 08-23-1950, 71 y.o.   MRN: [id number]  Chief Complaint  Patient presents with   Cough    Patient states she is still having the cough and sinus is making her dizzy and feels weak will climbing ladders.patient need medication    HPI Patient is in today for cough  Follow up from 08/10/21 - seen by Dr. Birdie Riddle Her cough had worsened after seeing me on 08/08/21  She had been given doxycycline and albuterol with minimal effect. She did finish the course of doxycycline that she was given.   She did take an old rx of mucinex dm which helped a bit.  Unfortunately she is still coughing.  She feels like she can't bring anything up.  Feels pressure through sinuses and into ears.  Feels weak and dizzy at times.   Had had some difficulty breathing, shob. This has resolved.   Outpatient Medications Prior to Visit  Medication Sig Dispense Refill   albuterol (VENTOLIN HFA) 108 (90 Base) MCG/ACT inhaler Inhale 2 puffs into the lungs every 6 (six) hours as needed for wheezing or shortness of breath. 8 g 0   amLODipine (NORVASC) 10 MG tablet Take 1 tablet (10 mg total) by mouth daily. 90 tablet 3   atorvastatin (LIPITOR) 20 MG tablet Take 1 tablet (20 mg total) by mouth daily. 90 tablet 3   benzonatate (TESSALON) 100 MG capsule Take 1 capsule (100 mg total) by mouth 3 (three) times daily as needed for cough. 20 capsule 0   cetirizine (ZYRTEC ALLERGY) 10 MG tablet Take 1 tablet (10 mg total) by mouth daily. 90 tablet 3   doxycycline (VIBRA-TABS) 100 MG tablet Take 1 tablet (100 mg total) by mouth 2 (two) times daily. 20 tablet 0   fluticasone (FLONASE) 50 MCG/ACT nasal spray Place 2 sprays into both nostrils daily. 18.2 mL 2   HYDROcodone bit-homatropine (HYCODAN) 5-1.5 MG/5ML syrup Take 5 mLs by mouth at bedtime as needed for cough. 120 mL 0   mesalamine (LIALDA) 1.2 g EC tablet Take 1-2 tablets (1.2-2.4 g total) by  mouth 2 (two) times daily. 180 tablet 0   Multiple Vitamins-Minerals (VISION VITAMINS) TABS Take 1 tablet by mouth daily.     olmesartan (BENICAR) 20 MG tablet Take 1 tablet (20 mg total) by mouth daily. 90 tablet 3   predniSONE (STERAPRED UNI-PAK 21 TAB) 10 MG (21) TBPK tablet Take per package instructions. Do not skip doses. Finish entire supply. 1 each 0   triamcinolone cream (KENALOG) 0.1 % Apply 1 application. topically 2 (two) times daily. 30 g 1   No facility-administered medications prior to visit.    Review of Systems  Constitutional: Negative.   HENT: Negative.    Eyes: Negative.   Respiratory: Negative.    Cardiovascular: Negative.   Gastrointestinal: Negative.   Endocrine: Negative.   Genitourinary: Negative.   Musculoskeletal: Negative.   Skin: Negative.   Allergic/Immunologic: Negative.   Neurological: Negative.   Hematological: Negative.   Psychiatric/Behavioral: Negative.    All other systems reviewed and are negative.     Objective:    BP 138/64   Pulse 79   Temp 98.3 F (36.8 C) (Temporal)   Resp 18   Ht 5' 1.5" (1.562 m)   SpO2 99%   BMI 28.78 kg/m  Physical Exam Vitals and nursing note reviewed.  Constitutional:      General: She is  not in acute distress.    Appearance: Normal appearance. She is normal weight. She is not ill-appearing, toxic-appearing or diaphoretic.  HENT:     Head: Normocephalic and atraumatic.     Nose: Congestion and rhinorrhea present.     Mouth/Throat:     Mouth: Mucous membranes are moist.     Pharynx: Oropharynx is clear. No oropharyngeal exudate or posterior oropharyngeal erythema.  Eyes:     Extraocular Movements: Extraocular movements intact.     Conjunctiva/sclera: Conjunctivae normal.     Pupils: Pupils are equal, round, and reactive to light.  Cardiovascular:     Rate and Rhythm: Normal rate and regular rhythm.     Heart sounds: Normal heart sounds. No murmur heard.   No friction rub. No gallop.  Pulmonary:      Effort: Pulmonary effort is normal. No respiratory distress.     Breath sounds: Normal breath sounds. No stridor. No wheezing, rhonchi or rales.  Chest:     Chest wall: No tenderness.  Skin:    General: Skin is warm and dry.  Neurological:     General: No focal deficit present.     Mental Status: She is alert and oriented to person, place, and time. Mental status is at baseline.  Psychiatric:        Mood and Affect: Mood normal.        Behavior: Behavior normal.        Thought Content: Thought content normal.        Judgment: Judgment normal.    No results found for any visits on 08/22/21.      Assessment & Plan:  1. Nasal congestion - azelastine (ASTELIN) 0.1 % nasal spray; Place 1 spray into both nostrils 2 (two) times daily. Use in each nostril as directed  Dispense: 30 mL; Refill: 12 - montelukast (SINGULAIR) 10 MG tablet; Take 1 tablet (10 mg total) by mouth at bedtime.  Dispense: 30 tablet; Refill: 3 - dextromethorphan-guaiFENesin (MUCINEX DM) 30-600 MG 12hr tablet; Take 1 tablet by mouth 2 (two) times daily.  Dispense: 20 tablet; Refill: 0  2. Subacute cough - azelastine (ASTELIN) 0.1 % nasal spray; Place 1 spray into both nostrils 2 (two) times daily. Use in each nostril as directed  Dispense: 30 mL; Refill: 12 - montelukast (SINGULAIR) 10 MG tablet; Take 1 tablet (10 mg total) by mouth at bedtime.  Dispense: 30 tablet; Refill: 3 - dextromethorphan-guaiFENesin (MUCINEX DM) 30-600 MG 12hr tablet; Take 1 tablet by mouth 2 (two) times daily.  Dispense: 20 tablet; Refill: 0    Meds ordered this encounter  Medications   azelastine (ASTELIN) 0.1 % nasal spray    Sig: Place 1 spray into both nostrils 2 (two) times daily. Use in each nostril as directed    Dispense:  30 mL    Refill:  12    Order Specific Question:   Supervising Provider    Answer:   Carlota Raspberry, JEFFREY R [[id number]]   montelukast (SINGULAIR) 10 MG tablet    Sig: Take 1 tablet (10 mg total) by mouth at bedtime.     Dispense:  30 tablet    Refill:  3    Order Specific Question:   Supervising Provider    Answer:   Carlota Raspberry, JEFFREY R [[id number]]   dextromethorphan-guaiFENesin (MUCINEX DM) 30-600 MG 12hr tablet    Sig: Take 1 tablet by mouth 2 (two) times daily.    Dispense:  20 tablet    Refill:  0  Order Specific Question:   Supervising Provider    Answer:   Carlota Raspberry, JEFFREY R [[id number]]    Return if symptoms worsen or fail to improve.  PLAN Suspect postinfectious cough / allergies Add azelastine and singulair  Refill mucinex-dm If worsening or failing to improve, consider allergy or ENT referral Patient encouraged to call clinic with any questions, comments, or concerns.   Maximiano Coss, NP

## 2021-09-13 ENCOUNTER — Telehealth: Payer: Self-pay | Admitting: Family Medicine

## 2021-09-13 NOTE — Telephone Encounter (Signed)
Left pt a VM asking her to make an apt that we do not call in antibodic without being seen in office .

## 2021-09-13 NOTE — Telephone Encounter (Signed)
Pt want to know if she can get a antibromic for her bladder infection

## 2021-09-19 ENCOUNTER — Encounter (HOSPITAL_COMMUNITY): Payer: Self-pay

## 2021-09-19 ENCOUNTER — Ambulatory Visit (HOSPITAL_COMMUNITY)
Admission: EM | Admit: 2021-09-19 | Discharge: 2021-09-19 | Disposition: A | Discharge status: Home / Self Care | Payer: PPO | Attending: Internal Medicine | Admitting: Internal Medicine

## 2021-09-19 DIAGNOSIS — R300 Dysuria: Secondary | ICD-10-CM | POA: Insufficient documentation

## 2021-09-19 DIAGNOSIS — R350 Frequency of micturition: Secondary | ICD-10-CM | POA: Diagnosis not present

## 2021-09-19 LAB — POCT URINALYSIS DIPSTICK, ED / UC
Bilirubin Urine: NEGATIVE
Glucose, UA: NEGATIVE mg/dL
Ketones, ur: NEGATIVE mg/dL
Nitrite: NEGATIVE
Protein, ur: NEGATIVE mg/dL
Specific Gravity, Urine: <=1.005 (ref 1.005–1.030)
Urobilinogen, UA: 0.2 mg/dL (ref 0.0–1.0)
pH: 5.5 (ref 5.0–8.0)

## 2021-09-19 MED ORDER — NITROFURANTOIN MONOHYD MACRO 100 MG PO CAPS: 100.0000 mg | ORAL_CAPSULE | Freq: Two times a day (BID) | ORAL | 0 refills | Status: DC

## 2021-09-19 MED ORDER — FLUCONAZOLE 150 MG PO TABS: 150.0000 mg | ORAL_TABLET | Freq: Every day | ORAL | 0 refills | Status: AC

## 2021-09-19 NOTE — ED Provider Notes (Signed)
[place]    CSN: [id number] Arrival date & time: 09/19/21  1208      History   Chief Complaint Chief Complaint  Patient presents with   Urinary Tract Infection    HPI Jessica Stephens is a 71 y.o. female.   Patient presents with chills, dysuria, urinary frequency, left-sided flank pain, vaginal itching and lower abdominal pressure for 7 days.  Has attempted use of ibuprofen but stopped taking as she felt it was making her constipated.  Has attempted use of increase fluid intake and cranberry juice for additional support.  Endorses a history of a prolapsed bladder requiring surgical intervention, patient endorses that she was getting frequent infections prior to the surgery but lessened after.  Attempted to get an appointment with her PCP but was unable.  Denies hematuria, urinary urgency, vaginal discharge, vaginal odor, new rash or lesions.     Past Medical History:  Diagnosis Date   Abnormal transaminases    Anxiety    Arthritis    Esophageal stricture    GERD (gastroesophageal reflux disease)    Hiatal hernia    Hyperlipidemia    Hypertension    Maxillary sinusitis    OAB (overactive bladder)    Proctitis    Ulcerative colitis ([place])    left sided   Vitamin B12 deficiency     Patient Active Problem List   Diagnosis Date Noted   Ulcerative colitis ([place]) 11/04/2020   Personal history of colonic polyps 11/04/2020   Age-related osteoporosis without current pathological fracture 03/11/2020   Pelvic prolapse 03/08/2020   Lower urinary tract symptoms (LUTS) 12/05/2016   Acute UTI 12/05/2016   Flank pain, acute 12/05/2016   Prolonged Q-T interval on ECG 08/21/2014   Lobar pneumonia due to unspecified organism 08/21/2014   CAP (community acquired pneumonia) 08/19/2014   Community acquired pneumonia 08/19/2014   Sepsis ([place]) 08/19/2014   Hypokalemia 08/19/2014   HYPERLIPIDEMIA 05/29/2010   DYSPNEA 05/29/2010   CHEST PAIN, ATYPICAL 05/02/2010    ESOPHAGEAL STRICTURE 04/07/2010   HIATAL HERNIA 04/07/2010   ULCERATIVE PROCTITIS 04/07/2010   ARTHRITIS 04/07/2010   LUQ PAIN 04/07/2010   TRANSAMINASES, SERUM, ELEVATED 04/07/2010   VITAMIN B12 DEFICIENCY 01/30/2008   ANXIETY, CHRONIC 01/29/2008   Essential hypertension 01/29/2008   CHRONIC MAXILLARY SINUSITIS 04/24/2007   G E R D 04/24/2007   UNSPECIFIED OSTEOPOROSIS 04/24/2007   ULCERATIVE COLITIS, LEFT SIDED 02/25/2007    Past Surgical History:  Procedure Laterality Date   CATARACT EXTRACTION     CESAREAN SECTION     nose surg     NOSE SURGERY      OB History   No obstetric history on file.      Home Medications    Prior to Admission medications   Medication Sig Start Date End Date Taking? Authorizing Provider  amLODipine (NORVASC) 10 MG tablet Take 1 tablet (10 mg total) by mouth daily. 06/26/21  Yes [name], MD  atorvastatin (LIPITOR) 20 MG tablet Take 1 tablet (20 mg total) by mouth daily. 06/26/21  Yes [name], MD  cetirizine (ZYRTEC ALLERGY) 10 MG tablet Take 1 tablet (10 mg total) by mouth daily. 12/23/20  Yes Maximiano Coss, NP  mesalamine (LIALDA) 1.2 g EC tablet Take 1-2 tablets (1.2-2.4 g total) by mouth 2 (two) times daily. 12/23/20  Yes Maximiano Coss, NP  Multiple Vitamins-Minerals (VISION VITAMINS) TABS Take 1 tablet by mouth daily.   Yes [provider]  olmesartan (BENICAR) 20 MG tablet  Take 1 tablet (20 mg total) by mouth daily. 06/26/21  Yes [name], MD  albuterol (VENTOLIN HFA) 108 (90 Base) MCG/ACT inhaler Inhale 2 puffs into the lungs every 6 (six) hours as needed for wheezing or shortness of breath. 08/10/21   Midge Minium, MD  azelastine (ASTELIN) 0.1 % nasal spray Place 1 spray into both nostrils 2 (two) times daily. Use in each nostril as directed 08/22/21   Maximiano Coss, NP  benzonatate (TESSALON) 100 MG capsule Take 1 capsule (100 mg total) by mouth 3 (three) times daily as needed for cough. 08/08/21    Maximiano Coss, NP  dextromethorphan-guaiFENesin [facility] DM) 30-600 MG 12hr tablet Take 1 tablet by mouth 2 (two) times daily. 08/22/21   Maximiano Coss, NP  doxycycline (VIBRA-TABS) 100 MG tablet Take 1 tablet (100 mg total) by mouth 2 (two) times daily. 08/10/21   Midge Minium, MD  fluticasone (FLONASE) 50 MCG/ACT nasal spray Place 2 sprays into both nostrils daily. 06/30/21   [name], MD  HYDROcodone bit-homatropine (HYCODAN) 5-1.5 MG/5ML syrup Take 5 mLs by mouth at bedtime as needed for cough. 08/08/21   Maximiano Coss, NP  montelukast (SINGULAIR) 10 MG tablet Take 1 tablet (10 mg total) by mouth at bedtime. 08/22/21   Maximiano Coss, NP  predniSONE (STERAPRED UNI-PAK 21 TAB) 10 MG (21) TBPK tablet Take per package instructions. Do not skip doses. Finish entire supply. 08/08/21   Maximiano Coss, NP  triamcinolone cream (KENALOG) 0.1 % Apply 1 application. topically 2 (two) times daily. 06/26/21   [name], MD    Family History Family History  Problem Relation Age of Onset   Arthritis Mother    Dementia Mother    Diabetes Father    Hyperlipidemia Father    Hypertension Father     Social History Social History   Tobacco Use   Smoking status: Never   Smokeless tobacco: Never  Vaping Use   Vaping Use: Never used  Substance Use Topics   Alcohol use: Yes    Comment: occasionally   Drug use: No     Allergies   Alendronate sodium, Other, and Sulfonamide derivatives   Review of Systems Review of Systems  Constitutional: Negative.   Respiratory: Negative.    Cardiovascular: Negative.   Gastrointestinal:  Positive for constipation. Negative for abdominal distention, abdominal pain, anal bleeding, blood in stool, diarrhea, nausea, rectal pain and vomiting.  Genitourinary:  Positive for dysuria, flank pain, frequency and pelvic pain. Negative for decreased urine volume, difficulty urinating, dyspareunia, enuresis, genital sores, hematuria, menstrual  problem, urgency, vaginal bleeding, vaginal discharge and vaginal pain.  Skin: Negative.   Neurological: Negative.      Physical Exam Triage Vital Signs ED Triage Vitals  Enc Vitals Group     BP 09/19/21 1308 (!) 156/80     Pulse Rate 09/19/21 1308 67     Resp 09/19/21 1308 16     Temp 09/19/21 1308 97.9 F (36.6 C)     Temp Source 09/19/21 1308 Oral     SpO2 09/19/21 1308 98 %     Weight 09/19/21 1311 157 lb (71.2 kg)     Height 09/19/21 1311 5' (1.524 m)     Head Circumference --      Peak Flow --      Pain Score 09/19/21 1310 7     Pain Loc --      Pain Edu? --      Excl. in Lane? --  No data found.  Updated Vital Signs BP (!) 156/80 (BP Location: Right Arm)   Pulse 67   Temp 97.9 F (36.6 C) (Oral)   Resp 16   Ht 5' (1.524 m)   Wt 157 lb (71.2 kg)   SpO2 98%   BMI 30.66 kg/m   Visual Acuity Right Eye Distance:   Left Eye Distance:   Bilateral Distance:    Right Eye Near:   Left Eye Near:    Bilateral Near:     Physical Exam Constitutional:      Appearance: Normal appearance.  HENT:     Head: Normocephalic.  Eyes:     Extraocular Movements: Extraocular movements intact.  Pulmonary:     Effort: Pulmonary effort is normal.  Abdominal:     General: Abdomen is flat. Bowel sounds are normal.     Palpations: Abdomen is soft.     Tenderness: There is abdominal tenderness in the suprapubic area. There is left CVA tenderness. There is no right CVA tenderness.  Skin:    General: Skin is warm and dry.  Neurological:     Mental Status: She is alert and oriented to person, place, and time. Mental status is at baseline.  Psychiatric:        Mood and Affect: Mood normal.        Behavior: Behavior normal.      UC Treatments / Results  Labs (all labs ordered are listed, but only abnormal results are displayed) Labs Reviewed  POCT URINALYSIS DIPSTICK, ED / UC    EKG   Radiology No results found.  Procedures Procedures (including critical care  time)  Medications Ordered in UC Medications - No data to display  Initial Impression / Assessment and Plan / UC Course  I have reviewed the triage vital signs and the nursing notes.  Pertinent labs & imaging results that were available during my care of the patient were reviewed by me and considered in my medical decision making (see chart for details).  Clinical Course as of 09/19/21 1332  Tue Sep 19, 2021  1331 Leukocytes,Ua(!): LARGE [AW]    Clinical Course User Index [AW] Hans Eden, NP    Dysuria Urinary frequency  Vital signs are stable, no signs of sepsis, tenderness over the suprapubic and left CVA on exam, urinalysis showing hemoglobin and Norrin Shreffler blood cells but no nitrates, sent for culture, discussed findings with patient, prescribed Macrobid and Diflucan prophylactically, vaginal swab checking for yeast and BV pending, advised discontinuation of ibuprofen, may use Tylenol or Pyridium as needed for additional comfort, advised increase fluid intake and good hygiene for additional support, may follow-up with this urgent care as needed Final Clinical Impressions(s) / UC Diagnoses   Final diagnoses:  None   Discharge Instructions   None    ED Prescriptions   None    PDMP not reviewed this encounter.   Hans Eden, NP 09/19/21 1357

## 2021-09-19 NOTE — ED Triage Notes (Addendum)
Onset of bladder infection symptoms last Tuesday. Patient having pressure, pain with urination, and burning.  Patient also having constipation from ibuprofen.   Patient has history of bladder prolapse and surgery along with previous bladder infections last year.

## 2021-09-19 NOTE — Discharge Instructions (Addendum)
Your urinalysis shows Jessica Stephens blood cells blood but does not show bacteria ,your urine will be sent to the lab to determine if bacteria is present, if any changes need to be made to your medications you will be notified  Begin use of Macrobid twice a day for 5 days, this will prophylactically provide coverage for bacteria  As you are having vaginal itching, take 1 Diflucan tablet when you get your medicine and take the second tablet after you complete your antibiotics, this will prophylactically treat for yeast  Your vaginal swab checking for yeast and bacterial vaginosis is pending for 2 to 3 days, you will be notified of positive test results only and treatment sent in for bacterial vaginosis if needed  You may use over-the-counter pyridium  to help minimize your symptoms until antibiotic removes bacteria, this medication will turn your urine orange  Increase your fluid intake through use of water  As always practice good hygiene, wiping front to back and avoidance of scented vaginal products to prevent further irritation  If symptoms continue to persist after use of medication or recur please follow-up with urgent care or your primary doctor as needed  For your constipation you may attempt use of an over-the-counter stool softener such as Colace or a laxative such as MiraLAX once a day, stool softeners will make it easier for you to go to the bathroom when you naturally have the urge whereas laxatives will cause you to go to the bathroom

## 2021-09-20 LAB — CERVICOVAGINAL ANCILLARY ONLY
Bacterial Vaginitis (gardnerella): NEGATIVE
Candida Glabrata: NEGATIVE
Candida Vaginitis: NEGATIVE
Comment: NEGATIVE
Comment: NEGATIVE
Comment: NEGATIVE

## 2021-09-20 LAB — URINE CULTURE: Culture: 10000 — AB

## 2021-10-19 DIAGNOSIS — N958 Other specified menopausal and perimenopausal disorders: Secondary | ICD-10-CM | POA: Diagnosis not present

## 2021-10-19 DIAGNOSIS — N941 Unspecified dyspareunia: Secondary | ICD-10-CM | POA: Diagnosis not present

## 2021-10-19 DIAGNOSIS — N952 Postmenopausal atrophic vaginitis: Secondary | ICD-10-CM | POA: Diagnosis not present

## 2021-10-19 DIAGNOSIS — N3000 Acute cystitis without hematuria: Secondary | ICD-10-CM | POA: Diagnosis not present

## 2021-10-20 ENCOUNTER — Telehealth: Payer: Self-pay | Admitting: Family Medicine

## 2021-10-20 NOTE — Telephone Encounter (Signed)
Left message for patient to call back and schedule Medicare Annual Wellness Visit (AWV).   Please offer to do virtually or by telephone.  Left office number and my jabber [phone].  Last AWV:02/29/2020  Please schedule at anytime with Nurse Health Advisor.

## 2022-01-01 ENCOUNTER — Ambulatory Visit (INDEPENDENT_AMBULATORY_CARE_PROVIDER_SITE_OTHER): Payer: PPO | Admitting: Family Medicine

## 2022-01-01 VITALS — BP 130/74 | HR 64 | Temp 98.1°F | Ht 60.0 in | Wt 152.4 lb

## 2022-01-01 DIAGNOSIS — J309 Allergic rhinitis, unspecified: Secondary | ICD-10-CM

## 2022-01-01 DIAGNOSIS — I10 Essential (primary) hypertension: Secondary | ICD-10-CM

## 2022-01-01 DIAGNOSIS — R42 Dizziness and giddiness: Secondary | ICD-10-CM | POA: Diagnosis not present

## 2022-01-01 DIAGNOSIS — E785 Hyperlipidemia, unspecified: Secondary | ICD-10-CM

## 2022-01-01 DIAGNOSIS — Z0000 Encounter for general adult medical examination without abnormal findings: Secondary | ICD-10-CM

## 2022-01-01 LAB — CBC
HCT: 41.7 % (ref 36.0–46.0)
Hemoglobin: 14.3 g/dL (ref 12.0–15.0)
MCHC: 34.4 g/dL (ref 30.0–36.0)
MCV: 87.5 fl (ref 78.0–100.0)
Platelets: 311 10*3/uL (ref 150.0–400.0)
RBC: 4.77 Mil/uL (ref 3.87–5.11)
RDW: 13.1 % (ref 11.5–15.5)
WBC: 6.8 10*3/uL (ref 4.0–10.5)

## 2022-01-01 LAB — COMPREHENSIVE METABOLIC PANEL
ALT: 21 U/L (ref 0–35)
AST: 22 U/L (ref 0–37)
Albumin: 4.4 g/dL (ref 3.5–5.2)
Alkaline Phosphatase: 110 U/L (ref 39–117)
BUN: 14 mg/dL (ref 6–23)
CO2: 29 mEq/L (ref 19–32)
Calcium: 9.6 mg/dL (ref 8.4–10.5)
Chloride: 104 mEq/L (ref 96–112)
Creatinine, Ser: 0.72 mg/dL (ref 0.40–1.20)
GFR: 85.48 mL/min (ref >60.00)
Glucose, Bld: 79 mg/dL (ref 70–99)
Potassium: 3.9 mEq/L (ref 3.5–5.1)
Sodium: 140 mEq/L (ref 135–145)
Total Bilirubin: 0.5 mg/dL (ref 0.2–1.2)
Total Protein: 7.5 g/dL (ref 6.0–8.3)

## 2022-01-01 LAB — LIPID PANEL
Cholesterol: 142 mg/dL (ref 0–200)
HDL: 56.4 mg/dL (ref >39.00)
LDL Cholesterol: 66 mg/dL (ref 0–99)
NonHDL: 85.7
Total CHOL/HDL Ratio: 3
Triglycerides: 97 mg/dL (ref 0.0–149.0)
VLDL: 19.4 mg/dL (ref 0.0–40.0)

## 2022-01-01 MED ORDER — AMLODIPINE BESYLATE 10 MG PO TABS: 10.0000 mg | ORAL_TABLET | Freq: Every day | ORAL | 3 refills | Status: DC

## 2022-01-01 MED ORDER — OLMESARTAN MEDOXOMIL 20 MG PO TABS: 20.0000 mg | ORAL_TABLET | Freq: Every day | ORAL | 3 refills | Status: DC

## 2022-01-01 MED ORDER — FLUTICASONE PROPIONATE 50 MCG/ACT NA SUSP: 2.0000 | Freq: Every day | NASAL | 2 refills | Status: DC

## 2022-01-01 MED ORDER — ATORVASTATIN CALCIUM 20 MG PO TABS
20.0000 mg | ORAL_TABLET | Freq: Every day | ORAL | 3 refills | Status: DC
Start: 2022-01-01 — End: 2023-01-08

## 2022-01-01 NOTE — Patient Instructions (Addendum)
Make sure to drink plenty of fluids, including at work.  Keep a record of your blood pressures outside of the office and bring them to the next office visit to discuss dizziness.  Make sure you check your blood pressure when you are feeling dizzy.  Return to the clinic or go to the nearest emergency room if any of your symptoms worsen or new symptoms occur.  Follow up with dentist, eye doctor, and follow up with Dr. Collene Mares.   Preventive Care 57 Years and Older, Female Preventive care refers to lifestyle choices and visits with your health care provider that can promote health and wellness. Preventive care visits are also called wellness exams. What can I expect for my preventive care visit? Counseling Your health care provider may ask you questions about your: Medical history, including: Past medical problems. Family medical history. Pregnancy and menstrual history. History of falls. Current health, including: Memory and ability to understand (cognition). Emotional well-being. Home life and relationship well-being. Sexual activity and sexual health. Lifestyle, including: Alcohol, nicotine or tobacco, and drug use. Access to firearms. Diet, exercise, and sleep habits. Work and work Statistician. Sunscreen use. Safety issues such as seatbelt and bike helmet use. Physical exam Your health care provider will check your: Height and weight. These may be used to calculate your BMI (body mass index). BMI is a measurement that tells if you are at a healthy weight. Waist circumference. This measures the distance around your waistline. This measurement also tells if you are at a healthy weight and may help predict your risk of certain diseases, such as type 2 diabetes and high blood pressure. Heart rate and blood pressure. Body temperature. Skin for abnormal spots. What immunizations do I need?  Vaccines are usually given at various ages, according to a schedule. Your health care provider will  recommend vaccines for you based on your age, medical history, and lifestyle or other factors, such as travel or where you work. What tests do I need? Screening Your health care provider may recommend screening tests for certain conditions. This may include: Lipid and cholesterol levels. Hepatitis C test. Hepatitis B test. HIV (human immunodeficiency virus) test. STI (sexually transmitted infection) testing, if you are at risk. Lung cancer screening. Colorectal cancer screening. Diabetes screening. This is done by checking your blood sugar (glucose) after you have not eaten for a while (fasting). Mammogram. Talk with your health care provider about how often you should have regular mammograms. BRCA-related cancer screening. This may be done if you have a family history of breast, ovarian, tubal, or peritoneal cancers. Bone density scan. This is done to screen for osteoporosis. Talk with your health care provider about your test results, treatment options, and if necessary, the need for more tests. Follow these instructions at home: Eating and drinking  Eat a diet that includes fresh fruits and vegetables, whole grains, lean protein, and low-fat dairy products. Limit your intake of foods with high amounts of sugar, saturated fats, and salt. Take vitamin and mineral supplements as recommended by your health care provider. Do not drink alcohol if your health care provider tells you not to drink. If you drink alcohol: Limit how much you have to 0-1 drink a day. Know how much alcohol is in your drink. In the [place] one drink equals one 12 oz bottle of beer (355 mL), one 5 oz glass of wine (148 mL), or one 1 oz glass of hard liquor (44 mL). Lifestyle Brush your teeth every morning and night  with fluoride toothpaste. Floss one time each day. Exercise for at least 30 minutes 5 or more days each week. Do not use any products that contain nicotine or tobacco. These products include cigarettes,  chewing tobacco, and vaping devices, such as e-cigarettes. If you need help quitting, ask your health care provider. Do not use drugs. If you are sexually active, practice safe sex. Use a condom or other form of protection in order to prevent STIs. Take aspirin only as told by your health care provider. Make sure that you understand how much to take and what form to take. Work with your health care provider to find out whether it is safe and beneficial for you to take aspirin daily. Ask your health care provider if you need to take a cholesterol-lowering medicine (statin). Find healthy ways to manage stress, such as: Meditation, yoga, or listening to music. Journaling. Talking to a trusted person. Spending time with friends and family. Minimize exposure to UV radiation to reduce your risk of skin cancer. Safety Always wear your seat belt while driving or riding in a vehicle. Do not drive: If you have been drinking alcohol. Do not ride with someone who has been drinking. When you are tired or distracted. While texting. If you have been using any mind-altering substances or drugs. Wear a helmet and other protective equipment during sports activities. If you have firearms in your house, make sure you follow all gun safety procedures. What's next? Visit your health care provider once a year for an annual wellness visit. Ask your health care provider how often you should have your eyes and teeth checked. Stay up to date on all vaccines. This information is not intended to replace advice given to you by your health care provider. Make sure you discuss any questions you have with your health care provider. Document Revised: 09/14/2020 Document Reviewed: 09/14/2020 Elsevier Patient Education  [place].   Dizziness Dizziness is a common problem. It is a feeling of unsteadiness or light-headedness. You may feel like you are about to faint. Dizziness can lead to injury if you stumble or  fall. Anyone can become dizzy, but dizziness is more common in older adults. This condition can be caused by a number of things, including medicines, dehydration, or illness. Follow these instructions at home: Eating and drinking  Drink enough fluid to keep your urine pale yellow. This helps to keep you from becoming dehydrated. Try to drink more clear fluids, such as water. Do not drink alcohol. Limit your caffeine intake if told to do so by your health care provider. Check ingredients and nutrition facts to see if a food or beverage contains caffeine. Limit your salt (sodium) intake if told to do so by your health care provider. Check ingredients and nutrition facts to see if a food or beverage contains sodium. Activity  Avoid making quick movements. Rise slowly from chairs and steady yourself until you feel okay. In the morning, first sit up on the side of the bed. When you feel okay, stand slowly while you hold onto something until you know that your balance is good. If you need to stand in one place for a long time, move your legs often. Tighten and relax the muscles in your legs while you are standing. Do not drive or use machinery if you feel dizzy. Avoid bending down if you feel dizzy. Place items in your home so that they are easy for you to reach without leaning over. Lifestyle Do not  use any products that contain nicotine or tobacco. These products include cigarettes, chewing tobacco, and vaping devices, such as e-cigarettes. If you need help quitting, ask your health care provider. Try to reduce your stress level by using methods such as yoga or meditation. Talk with your health care provider if you need help to manage your stress. General instructions Watch your dizziness for any changes. Take over-the-counter and prescription medicines only as told by your health care provider. Talk with your health care provider if you think that your dizziness is caused by a medicine that you are  taking. Tell a friend or a family member that you are feeling dizzy. If he or she notices any changes in your behavior, have this person call your health care provider. Keep all follow-up visits. This is important. Contact a health care provider if: Your dizziness does not go away or you have new symptoms. Your dizziness or light-headedness gets worse. You feel nauseous. You have reduced hearing. You have a fever. You have neck pain or a stiff neck. Your dizziness leads to an injury or a fall. Get help right away if: You vomit or have diarrhea and are unable to eat or drink anything. You have problems talking, walking, swallowing, or using your arms, hands, or legs. You feel generally weak. You have any bleeding. You are not thinking clearly or you have trouble forming sentences. It may take a friend or family member to notice this. You have chest pain, abdominal pain, shortness of breath, or sweating. Your vision changes or you develop a severe headache. These symptoms may represent a serious problem that is an emergency. Do not wait to see if the symptoms will go away. Get medical help right away. Call your local emergency services (911 in the [place] Do not drive yourself to the hospital. Summary Dizziness is a feeling of unsteadiness or light-headedness. This condition can be caused by a number of things, including medicines, dehydration, or illness. Anyone can become dizzy, but dizziness is more common in older adults. Drink enough fluid to keep your urine pale yellow. Do not drink alcohol. Avoid making quick movements if you feel dizzy. Monitor your dizziness for any changes. This information is not intended to replace advice given to you by your health care provider. Make sure you discuss any questions you have with your health care provider. Document Revised: 02/22/2020 Document Reviewed: 02/22/2020 Elsevier Patient Education  [place].

## 2022-01-01 NOTE — Progress Notes (Signed)
Subjective:  Patient ID: Jessica Stephens, female    DOB: 1950-11-22  Age: 71 y.o. MRN: [id number]  CC:  Chief Complaint  Patient presents with   Annual Exam    Pt notes doing well is staying busy working at [employer]     HPI Jessica Stephens presents for Annual Exam Has been staying busy - working at [employer].   Hypertension: Benicar 20 mg daily, amlodipine 10 mg daily Home readings: none.  No new med side effects. Occasionally feels dizzy 62mn after taking meds. Has not checked BP when having these symptoms.  May not be drinking enough water at new job.  No syncope/CP/palpitations.  BP Readings from Last 3 Encounters:  01/01/22 130/74  09/19/21 (!) 156/80  08/22/21 138/64   Lab Results  Component Value Date   CREATININE 0.78 03/09/2020    Hyperlipidemia: Lipitor 20 mg daily. No new myalgias. Sore only with long days on feet with work.  Lab Results  Component Value Date   CHOL 170 02/29/2020   HDL 62 02/29/2020   LDLCALC 93 02/29/2020   TRIG 78 02/29/2020   CHOLHDL 2.7 02/29/2020   Lab Results  Component Value Date   ALT 22 03/09/2020   AST 19 03/09/2020   ALKPHOS 97 03/09/2020   BILITOT 0.5 03/09/2020   Allergic rhinitis Treated with Zyrtec, astelin nasal spray - not using, Flonase - as needed only. albuterol - not needed.       01/01/2022   12:54 PM 08/22/2021    9:02 AM 08/08/2021    8:49 AM 06/26/2021    1:25 PM 12/27/2020   12:29 PM  Depression screen PHQ 2/9  Decreased Interest 0 0 0 0 0  Down, Depressed, Hopeless 0 0 0 0 0  PHQ - 2 Score 0 0 0 0 0  Altered sleeping  0 0  0  Tired, decreased energy  0 0  0  Change in appetite  0 0  0  Feeling bad or failure about yourself   0 0  0  Trouble concentrating  0 0  0  Moving slowly or fidgety/restless  0 0  0  Suicidal thoughts  0 0  0  PHQ-9 Score  0 0  0  Difficult doing work/chores  Not difficult at all Not difficult at all  Not difficult at all    Health Maintenance  Topic Date Due   COVID-19  Vaccine (6 - Pfizer series) 01/17/2022 (Originally 04/26/2021)   Zoster Vaccines- Shingrix (2 of 2) 04/03/2022 (Originally 06/19/2021)   INFLUENZA VACCINE  07/02/2022 (Originally 10/31/2021)   MAMMOGRAM  08/17/2022   TETANUS/TDAP  02/07/2025   COLONOSCOPY (Pts 45-48yrInsurance coverage will need to be confirmed)  11/26/2029   Pneumonia Vaccine 8561Years old  Completed   DEXA SCAN  Completed   Hepatitis C Screening  Completed   HPV VACCINES  Aged Out  Mammogram 07/1020 - plans to schedule. Colonoscopy in 2021. Plans to coordinate follow up with Dr. MaCollene Marest appt later this week.   Immunization History  Administered Date(s) Administered   Fluad Quad(high Dose 65+) 12/08/2018, 12/23/2020   Influenza Split 01/03/2012   Influenza Whole 01/30/2008   Influenza, High Dose Seasonal PF 01/26/2018   Influenza,inj,Quad PF,6+ Mos 01/22/2013, 01/27/2014, 02/08/2015, 01/20/2016   Influenza-Unspecified 02/15/2017, 12/28/2019   PFIZER(Purple Top)SARS-COV-2 Vaccination 05/17/2019, 06/09/2019, 01/16/2020, 08/16/2020, 03/01/2021   Pneumococcal Conjugate-13 01/27/2014   Pneumococcal Polysaccharide-23 12/22/2018   Tdap 02/08/2015   Zoster Recombinat (Shingrix) 04/24/2021   Zoster,  Unspecified 04/24/2021  Defers flu vaccine until end of month.  Covid booster - recommended at her pharmacy.  No results found. Optho - plans to schedule follow up with Dr. Gershon Crane.   Dental: has dentist -due for follow up.   Alcohol: rare.   Tobacco: none.   Exercise: active at work with walking.    History Patient Active Problem List   Diagnosis Date Noted   Ulcerative colitis ([place]) 11/04/2020   Personal history of colonic polyps 11/04/2020   Age-related osteoporosis without current pathological fracture 03/11/2020   Pelvic prolapse 03/08/2020   Lower urinary tract symptoms (LUTS) 12/05/2016   Acute UTI 12/05/2016   Flank pain, acute 12/05/2016   Prolonged Q-T interval on ECG 08/21/2014   Lobar pneumonia due to  unspecified organism 08/21/2014   CAP (community acquired pneumonia) 08/19/2014   Community acquired pneumonia 08/19/2014   Sepsis ([place]) 08/19/2014   Hypokalemia 08/19/2014   HYPERLIPIDEMIA 05/29/2010   DYSPNEA 05/29/2010   CHEST PAIN, ATYPICAL 05/02/2010   ESOPHAGEAL STRICTURE 04/07/2010   HIATAL HERNIA 04/07/2010   ULCERATIVE PROCTITIS 04/07/2010   ARTHRITIS 04/07/2010   LUQ PAIN 04/07/2010   TRANSAMINASES, SERUM, ELEVATED 04/07/2010   VITAMIN B12 DEFICIENCY 01/30/2008   ANXIETY, CHRONIC 01/29/2008   Essential hypertension 01/29/2008   CHRONIC MAXILLARY SINUSITIS 04/24/2007   G E R D 04/24/2007   UNSPECIFIED OSTEOPOROSIS 04/24/2007   ULCERATIVE COLITIS, LEFT SIDED 02/25/2007   Past Medical History:  Diagnosis Date   Abnormal transaminases    Anxiety    Arthritis    Esophageal stricture    GERD (gastroesophageal reflux disease)    Hiatal hernia    Hyperlipidemia    Hypertension    Maxillary sinusitis    OAB (overactive bladder)    Proctitis    Ulcerative colitis ([place])    left sided   Vitamin B12 deficiency    Past Surgical History:  Procedure Laterality Date   CATARACT EXTRACTION     CESAREAN SECTION     nose surg     NOSE SURGERY     Allergies  Allergen Reactions   Alendronate Sodium    Other Other (See Comments)    "messes up blood"    Sulfonamide Derivatives    Prior to Admission medications   Medication Sig Start Date End Date Taking? Authorizing Provider  albuterol (VENTOLIN HFA) 108 (90 Base) MCG/ACT inhaler Inhale 2 puffs into the lungs every 6 (six) hours as needed for wheezing or shortness of breath. 08/10/21  Yes Midge Minium, MD  amLODipine (NORVASC) 10 MG tablet Take 1 tablet (10 mg total) by mouth daily. 06/26/21  Yes [name], MD  atorvastatin (LIPITOR) 20 MG tablet Take 1 tablet (20 mg total) by mouth daily. 06/26/21  Yes [name], MD  azelastine (ASTELIN) 0.1 % nasal spray Place 1 spray into both nostrils 2 (two) times  daily. Use in each nostril as directed 08/22/21  Yes Maximiano Coss, NP  benzonatate (TESSALON) 100 MG capsule Take 1 capsule (100 mg total) by mouth 3 (three) times daily as needed for cough. 08/08/21  Yes Maximiano Coss, NP  cetirizine (ZYRTEC ALLERGY) 10 MG tablet Take 1 tablet (10 mg total) by mouth daily. 12/23/20  Yes Maximiano Coss, NP  dextromethorphan-guaiFENesin [facility] DM) 30-600 MG 12hr tablet Take 1 tablet by mouth 2 (two) times daily. 08/22/21  Yes Maximiano Coss, NP  doxycycline (VIBRA-TABS) 100 MG tablet Take 1 tablet (100 mg total) by mouth 2 (two) times daily. 08/10/21  Yes  Midge Minium, MD  fluticasone [facility]) 50 MCG/ACT nasal spray Place 2 sprays into both nostrils daily. 06/30/21  Yes [name], MD  HYDROcodone bit-homatropine (HYCODAN) 5-1.5 MG/5ML syrup Take 5 mLs by mouth at bedtime as needed for cough. 08/08/21  Yes Maximiano Coss, NP  mesalamine (LIALDA) 1.2 g EC tablet Take 1-2 tablets (1.2-2.4 g total) by mouth 2 (two) times daily. 12/23/20  Yes Maximiano Coss, NP  montelukast (SINGULAIR) 10 MG tablet Take 1 tablet (10 mg total) by mouth at bedtime. 08/22/21  Yes Maximiano Coss, NP  Multiple Vitamins-Minerals (VISION VITAMINS) TABS Take 1 tablet by mouth daily.   Yes [provider]  nitrofurantoin, macrocrystal-monohydrate, (MACROBID) 100 MG capsule Take 1 capsule (100 mg total) by mouth 2 (two) times daily. 09/19/21  Yes White, Adrienne R, NP  olmesartan (BENICAR) 20 MG tablet Take 1 tablet (20 mg total) by mouth daily. 06/26/21  Yes [name], MD  predniSONE (STERAPRED UNI-PAK 21 TAB) 10 MG (21) TBPK tablet Take per package instructions. Do not skip doses. Finish entire supply. 08/08/21  Yes Maximiano Coss, NP  triamcinolone cream (KENALOG) 0.1 % Apply 1 application. topically 2 (two) times daily. 06/26/21  Yes [name], MD   Social History   Socioeconomic History   Marital status: Married    Spouse name: Not on file   Number of  children: Not on file   Years of education: Not on file   Highest education level: Not on file  Occupational History   Not on file  Tobacco Use   Smoking status: Never   Smokeless tobacco: Never  Vaping Use   Vaping Use: Never used  Substance and Sexual Activity   Alcohol use: Yes    Comment: occasionally   Drug use: No   Sexual activity: Not on file  Other Topics Concern   Not on file  Social History Narrative   Not on file   Social Determinants of Health   Financial Resource Strain: Not on file  Food Insecurity: Not on file  Transportation Needs: Not on file  Physical Activity: Not on file  Stress: Not on file  Social Connections: Not on file  Intimate Partner Violence: Not on file    Review of Systems 13 point review of systems per patient health survey noted.  Negative other than as indicated above or in HPI.    Objective:   Vitals:   01/01/22 1251  BP: 130/74  Pulse: 64  Temp: 98.1 F (36.7 C)  TempSrc: Oral  SpO2: 98%  Weight: 152 lb 6.4 oz (69.1 kg)  Height: 5' (1.524 m)     Physical Exam Constitutional:      Appearance: She is well-developed.  HENT:     Head: Normocephalic and atraumatic.     Right Ear: External ear normal.     Left Ear: External ear normal.  Eyes:     Conjunctiva/sclera: Conjunctivae normal.     Pupils: Pupils are equal, round, and reactive to light.  Neck:     Thyroid: No thyromegaly.  Cardiovascular:     Rate and Rhythm: Normal rate and regular rhythm.     Heart sounds: Normal heart sounds. No murmur heard. Pulmonary:     Effort: Pulmonary effort is normal. No respiratory distress.     Breath sounds: Normal breath sounds. No wheezing.  Abdominal:     General: Bowel sounds are normal.     Palpations: Abdomen is soft.     Tenderness: There  is no abdominal tenderness.  Musculoskeletal:        General: No tenderness. Normal range of motion.     Cervical back: Normal range of motion and neck supple.  Lymphadenopathy:      Cervical: No cervical adenopathy.  Skin:    General: Skin is warm and dry.     Findings: No rash.  Neurological:     Mental Status: She is alert and oriented to person, place, and time.  Psychiatric:        Behavior: Behavior normal.        Thought Content: Thought content normal.        Assessment & Plan:  Jessica Stephens is a 71 y.o. female . Annual physical exam  - -anticipatory guidance as below in AVS, screening labs above. Health maintenance items as above in HPI discussed/recommended as applicable.   Essential hypertension - Plan: amLODipine (NORVASC) 10 MG tablet, olmesartan (BENICAR) 20 MG tablet Episode of dizziness - Plan: CBC, Comprehensive metabolic panel  -Based on in office readings will continue same dose of meds.  Increase fluid intake, check home BPs especially if lightheaded/dizzy as possible component of orthostasis.  Recheck soon with those readings and discussed dizziness further with RTC/ER precautions if acute worsening.  Check labs above.  Hyperlipidemia, unspecified hyperlipidemia type - Plan: Lipid panel, Comprehensive metabolic panel, atorvastatin (LIPITOR) 20 MG tablet  -  Stable, tolerating current regimen. Medications refilled. Labs pending as above.   Allergic rhinitis, unspecified seasonality, unspecified trigger - Plan: fluticasone (FLONASE) 50 MCG/ACT nasal spray  -  Stable, tolerating current regimen. Medications refilled.    Meds ordered this encounter  Medications   amLODipine (NORVASC) 10 MG tablet    Sig: Take 1 tablet (10 mg total) by mouth daily.    Dispense:  90 tablet    Refill:  3   fluticasone (FLONASE) 50 MCG/ACT nasal spray    Sig: Place 2 sprays into both nostrils daily.    Dispense:  18.2 mL    Refill:  2   atorvastatin (LIPITOR) 20 MG tablet    Sig: Take 1 tablet (20 mg total) by mouth daily.    Dispense:  90 tablet    Refill:  3   olmesartan (BENICAR) 20 MG tablet    Sig: Take 1 tablet (20 mg total) by mouth daily.     Dispense:  90 tablet    Refill:  3   Patient Instructions  Make sure to drink plenty of fluids, including at work.  Keep a record of your blood pressures outside of the office and bring them to the next office visit to discuss dizziness.  Make sure you check your blood pressure when you are feeling dizzy.  Return to the clinic or go to the nearest emergency room if any of your symptoms worsen or new symptoms occur.  Follow up with dentist, eye doctor, and follow up with Dr. Collene Mares.   Preventive Care 52 Years and Older, Female Preventive care refers to lifestyle choices and visits with your health care provider that can promote health and wellness. Preventive care visits are also called wellness exams. What can I expect for my preventive care visit? Counseling Your health care provider may ask you questions about your: Medical history, including: Past medical problems. Family medical history. Pregnancy and menstrual history. History of falls. Current health, including: Memory and ability to understand (cognition). Emotional well-being. Home life and relationship well-being. Sexual activity and sexual health. Lifestyle, including: Alcohol, nicotine or  tobacco, and drug use. Access to firearms. Diet, exercise, and sleep habits. Work and work Statistician. Sunscreen use. Safety issues such as seatbelt and bike helmet use. Physical exam Your health care provider will check your: Height and weight. These may be used to calculate your BMI (body mass index). BMI is a measurement that tells if you are at a healthy weight. Waist circumference. This measures the distance around your waistline. This measurement also tells if you are at a healthy weight and may help predict your risk of certain diseases, such as type 2 diabetes and high blood pressure. Heart rate and blood pressure. Body temperature. Skin for abnormal spots. What immunizations do I need?  Vaccines are usually given at  various ages, according to a schedule. Your health care provider will recommend vaccines for you based on your age, medical history, and lifestyle or other factors, such as travel or where you work. What tests do I need? Screening Your health care provider may recommend screening tests for certain conditions. This may include: Lipid and cholesterol levels. Hepatitis C test. Hepatitis B test. HIV (human immunodeficiency virus) test. STI (sexually transmitted infection) testing, if you are at risk. Lung cancer screening. Colorectal cancer screening. Diabetes screening. This is done by checking your blood sugar (glucose) after you have not eaten for a while (fasting). Mammogram. Talk with your health care provider about how often you should have regular mammograms. BRCA-related cancer screening. This may be done if you have a family history of breast, ovarian, tubal, or peritoneal cancers. Bone density scan. This is done to screen for osteoporosis. Talk with your health care provider about your test results, treatment options, and if necessary, the need for more tests. Follow these instructions at home: Eating and drinking  Eat a diet that includes fresh fruits and vegetables, whole grains, lean protein, and low-fat dairy products. Limit your intake of foods with high amounts of sugar, saturated fats, and salt. Take vitamin and mineral supplements as recommended by your health care provider. Do not drink alcohol if your health care provider tells you not to drink. If you drink alcohol: Limit how much you have to 0-1 drink a day. Know how much alcohol is in your drink. In the [place] one drink equals one 12 oz bottle of beer (355 mL), one 5 oz glass of wine (148 mL), or one 1 oz glass of hard liquor (44 mL). Lifestyle Brush your teeth every morning and night with fluoride toothpaste. Floss one time each day. Exercise for at least 30 minutes 5 or more days each week. Do not use any products  that contain nicotine or tobacco. These products include cigarettes, chewing tobacco, and vaping devices, such as e-cigarettes. If you need help quitting, ask your health care provider. Do not use drugs. If you are sexually active, practice safe sex. Use a condom or other form of protection in order to prevent STIs. Take aspirin only as told by your health care provider. Make sure that you understand how much to take and what form to take. Work with your health care provider to find out whether it is safe and beneficial for you to take aspirin daily. Ask your health care provider if you need to take a cholesterol-lowering medicine (statin). Find healthy ways to manage stress, such as: Meditation, yoga, or listening to music. Journaling. Talking to a trusted person. Spending time with friends and family. Minimize exposure to UV radiation to reduce your risk of skin cancer. Safety Always wear your  seat belt while driving or riding in a vehicle. Do not drive: If you have been drinking alcohol. Do not ride with someone who has been drinking. When you are tired or distracted. While texting. If you have been using any mind-altering substances or drugs. Wear a helmet and other protective equipment during sports activities. If you have firearms in your house, make sure you follow all gun safety procedures. What's next? Visit your health care provider once a year for an annual wellness visit. Ask your health care provider how often you should have your eyes and teeth checked. Stay up to date on all vaccines. This information is not intended to replace advice given to you by your health care provider. Make sure you discuss any questions you have with your health care provider. Document Revised: 09/14/2020 Document Reviewed: 09/14/2020 Elsevier Patient Education  [place].   Dizziness Dizziness is a common problem. It is a feeling of unsteadiness or light-headedness. You may feel like  you are about to faint. Dizziness can lead to injury if you stumble or fall. Anyone can become dizzy, but dizziness is more common in older adults. This condition can be caused by a number of things, including medicines, dehydration, or illness. Follow these instructions at home: Eating and drinking  Drink enough fluid to keep your urine pale yellow. This helps to keep you from becoming dehydrated. Try to drink more clear fluids, such as water. Do not drink alcohol. Limit your caffeine intake if told to do so by your health care provider. Check ingredients and nutrition facts to see if a food or beverage contains caffeine. Limit your salt (sodium) intake if told to do so by your health care provider. Check ingredients and nutrition facts to see if a food or beverage contains sodium. Activity  Avoid making quick movements. Rise slowly from chairs and steady yourself until you feel okay. In the morning, first sit up on the side of the bed. When you feel okay, stand slowly while you hold onto something until you know that your balance is good. If you need to stand in one place for a long time, move your legs often. Tighten and relax the muscles in your legs while you are standing. Do not drive or use machinery if you feel dizzy. Avoid bending down if you feel dizzy. Place items in your home so that they are easy for you to reach without leaning over. Lifestyle Do not use any products that contain nicotine or tobacco. These products include cigarettes, chewing tobacco, and vaping devices, such as e-cigarettes. If you need help quitting, ask your health care provider. Try to reduce your stress level by using methods such as yoga or meditation. Talk with your health care provider if you need help to manage your stress. General instructions Watch your dizziness for any changes. Take over-the-counter and prescription medicines only as told by your health care provider. Talk with your health care  provider if you think that your dizziness is caused by a medicine that you are taking. Tell a friend or a family member that you are feeling dizzy. If he or she notices any changes in your behavior, have this person call your health care provider. Keep all follow-up visits. This is important. Contact a health care provider if: Your dizziness does not go away or you have new symptoms. Your dizziness or light-headedness gets worse. You feel nauseous. You have reduced hearing. You have a fever. You have neck pain or a stiff  neck. Your dizziness leads to an injury or a fall. Get help right away if: You vomit or have diarrhea and are unable to eat or drink anything. You have problems talking, walking, swallowing, or using your arms, hands, or legs. You feel generally weak. You have any bleeding. You are not thinking clearly or you have trouble forming sentences. It may take a friend or family member to notice this. You have chest pain, abdominal pain, shortness of breath, or sweating. Your vision changes or you develop a severe headache. These symptoms may represent a serious problem that is an emergency. Do not wait to see if the symptoms will go away. Get medical help right away. Call your local emergency services (911 in the [place] Do not drive yourself to the hospital. Summary Dizziness is a feeling of unsteadiness or light-headedness. This condition can be caused by a number of things, including medicines, dehydration, or illness. Anyone can become dizzy, but dizziness is more common in older adults. Drink enough fluid to keep your urine pale yellow. Do not drink alcohol. Avoid making quick movements if you feel dizzy. Monitor your dizziness for any changes. This information is not intended to replace advice given to you by your health care provider. Make sure you discuss any questions you have with your health care provider. Document Revised: 02/22/2020 Document Reviewed:  02/22/2020 Elsevier Patient Education  2023 [place],   Merri Ray, MD [place] 01/01/22 1:23 PM

## 2022-01-02 ENCOUNTER — Encounter: Payer: Self-pay | Admitting: Family Medicine

## 2022-01-04 DIAGNOSIS — K219 Gastro-esophageal reflux disease without esophagitis: Secondary | ICD-10-CM | POA: Diagnosis not present

## 2022-01-04 DIAGNOSIS — K5100 Ulcerative (chronic) pancolitis without complications: Secondary | ICD-10-CM | POA: Diagnosis not present

## 2022-01-04 DIAGNOSIS — E782 Mixed hyperlipidemia: Secondary | ICD-10-CM | POA: Diagnosis not present

## 2022-01-04 DIAGNOSIS — E669 Obesity, unspecified: Secondary | ICD-10-CM | POA: Diagnosis not present

## 2022-01-04 DIAGNOSIS — Z86010 Personal history of colonic polyps: Secondary | ICD-10-CM | POA: Diagnosis not present

## 2022-01-04 DIAGNOSIS — I10 Essential (primary) hypertension: Secondary | ICD-10-CM | POA: Diagnosis not present

## 2022-01-22 DIAGNOSIS — N3000 Acute cystitis without hematuria: Secondary | ICD-10-CM | POA: Diagnosis not present

## 2022-01-22 DIAGNOSIS — N200 Calculus of kidney: Secondary | ICD-10-CM | POA: Diagnosis not present

## 2022-03-23 ENCOUNTER — Encounter (HOSPITAL_COMMUNITY): Payer: Self-pay

## 2022-03-23 ENCOUNTER — Ambulatory Visit (HOSPITAL_COMMUNITY)
Admission: EM | Admit: 2022-03-23 | Discharge: 2022-03-23 | Disposition: A | Discharge status: Home / Self Care | Payer: PPO | Attending: Family Medicine | Admitting: Family Medicine

## 2022-03-23 DIAGNOSIS — U071 COVID-19: Secondary | ICD-10-CM | POA: Insufficient documentation

## 2022-03-23 DIAGNOSIS — J069 Acute upper respiratory infection, unspecified: Secondary | ICD-10-CM | POA: Diagnosis not present

## 2022-03-23 DIAGNOSIS — J4521 Mild intermittent asthma with (acute) exacerbation: Secondary | ICD-10-CM

## 2022-03-23 MED ORDER — PREDNISONE 20 MG PO TABS: 40.0000 mg | ORAL_TABLET | Freq: Every day | ORAL | 0 refills | Status: AC

## 2022-03-23 MED ORDER — ALBUTEROL SULFATE HFA 108 (90 BASE) MCG/ACT IN AERS
2.0000 | INHALATION_SPRAY | RESPIRATORY_TRACT | 0 refills | Status: AC | PRN
Start: 2022-03-23 — End: ?

## 2022-03-23 MED ORDER — OSELTAMIVIR PHOSPHATE 75 MG PO CAPS: 75.0000 mg | ORAL_CAPSULE | Freq: Two times a day (BID) | ORAL | 0 refills | Status: DC

## 2022-03-23 NOTE — ED Triage Notes (Signed)
Pt states body aches,congestion and runny nose since yesterday. States her son has Covid.

## 2022-03-23 NOTE — ED Notes (Signed)
Encouraged pt to sign up for mychart pt states she does not feel comfortable using the computer.

## 2022-03-23 NOTE — ED Provider Notes (Signed)
[place]    CSN: [id number] Arrival date & time: 03/23/22  1026      History   Chief Complaint Chief Complaint  Patient presents with   Nasal Congestion    HPI Jessica Stephens is a 71 y.o. female.   HPI Here for congestion and cough, myalgia, and malaise.  She is also had some low-grade fever in the 99.4 range.  Symptoms began December 20th.  Then overnight she began having more of a staccato cough and feeling tight when she is coughing.  She does have a history of "bronchitis" and has used an inhaler.  She does work with the public unmasked in retail, but also her son has recently had COVID  Past Medical History:  Diagnosis Date   Abnormal transaminases    Anxiety    Arthritis    Esophageal stricture    GERD (gastroesophageal reflux disease)    Hiatal hernia    Hyperlipidemia    Hypertension    Maxillary sinusitis    OAB (overactive bladder)    Proctitis    Ulcerative colitis ([place])    left sided   Vitamin B12 deficiency     Patient Active Problem List   Diagnosis Date Noted   Ulcerative colitis ([place]) 11/04/2020   Personal history of colonic polyps 11/04/2020   Age-related osteoporosis without current pathological fracture 03/11/2020   Pelvic prolapse 03/08/2020   Lower urinary tract symptoms (LUTS) 12/05/2016   Acute UTI 12/05/2016   Flank pain, acute 12/05/2016   Prolonged Q-T interval on ECG 08/21/2014   Lobar pneumonia due to unspecified organism 08/21/2014   CAP (community acquired pneumonia) 08/19/2014   Community acquired pneumonia 08/19/2014   Sepsis ([place]) 08/19/2014   Hypokalemia 08/19/2014   HYPERLIPIDEMIA 05/29/2010   DYSPNEA 05/29/2010   CHEST PAIN, ATYPICAL 05/02/2010   ESOPHAGEAL STRICTURE 04/07/2010   HIATAL HERNIA 04/07/2010   ULCERATIVE PROCTITIS 04/07/2010   ARTHRITIS 04/07/2010   LUQ PAIN 04/07/2010   TRANSAMINASES, SERUM, ELEVATED 04/07/2010   VITAMIN B12 DEFICIENCY 01/30/2008   ANXIETY, CHRONIC 01/29/2008    Essential hypertension 01/29/2008   CHRONIC MAXILLARY SINUSITIS 04/24/2007   G E R D 04/24/2007   UNSPECIFIED OSTEOPOROSIS 04/24/2007   ULCERATIVE COLITIS, LEFT SIDED 02/25/2007    Past Surgical History:  Procedure Laterality Date   CATARACT EXTRACTION     CESAREAN SECTION     nose surg     NOSE SURGERY      OB History   No obstetric history on file.      Home Medications    Prior to Admission medications   Medication Sig Start Date End Date Taking? Authorizing Provider  oseltamivir (TAMIFLU) 75 MG capsule Take 1 capsule (75 mg total) by mouth every 12 (twelve) hours. 03/23/22  Yes Barrett Henle, MD  predniSONE (DELTASONE) 20 MG tablet Take 2 tablets (40 mg total) by mouth daily with breakfast for 5 days. 03/23/22 03/28/22 Yes Maximiliano Cromartie, Gwenlyn Perking, MD  albuterol (VENTOLIN HFA) 108 (90 Base) MCG/ACT inhaler Inhale 2 puffs into the lungs every 4 (four) hours as needed for wheezing or shortness of breath. 03/23/22   Barrett Henle, MD  amLODipine (NORVASC) 10 MG tablet Take 1 tablet (10 mg total) by mouth daily. 01/01/22   [name], MD  atorvastatin (LIPITOR) 20 MG tablet Take 1 tablet (20 mg total) by mouth daily. 01/01/22   [name], MD  cetirizine (ZYRTEC ALLERGY) 10 MG tablet Take 1 tablet (10 mg total) by mouth  daily. 12/23/20   Maximiano Coss, NP  fluticasone [facility]) 50 MCG/ACT nasal spray Place 2 sprays into both nostrils daily. 01/01/22   [name], MD  mesalamine (LIALDA) 1.2 g EC tablet Take 1-2 tablets (1.2-2.4 g total) by mouth 2 (two) times daily. 12/23/20   Maximiano Coss, NP  montelukast (SINGULAIR) 10 MG tablet Take 1 tablet (10 mg total) by mouth at bedtime. 08/22/21   Maximiano Coss, NP  Multiple Vitamins-Minerals (VISION VITAMINS) TABS Take 1 tablet by mouth daily.    [provider]  olmesartan (BENICAR) 20 MG tablet Take 1 tablet (20 mg total) by mouth daily. 01/01/22   [name], MD    Family History Family  History  Problem Relation Age of Onset   Arthritis Mother    Dementia Mother    Diabetes Father    Hyperlipidemia Father    Hypertension Father     Social History Social History   Tobacco Use   Smoking status: Never   Smokeless tobacco: Never  Vaping Use   Vaping Use: Never used  Substance Use Topics   Alcohol use: Yes    Comment: occasionally   Drug use: No     Allergies   Alendronate sodium, Other, and Sulfonamide derivatives   Review of Systems Review of Systems   Physical Exam Triage Vital Signs ED Triage Vitals  Enc Vitals Group     BP 03/23/22 1140 (!) 176/96     Pulse Rate 03/23/22 1140 84     Resp 03/23/22 1140 16     Temp 03/23/22 1140 99.4 F (37.4 C)     Temp Source 03/23/22 1140 Oral     SpO2 03/23/22 1140 97 %     Weight --      Height --      Head Circumference --      Peak Flow --      Pain Score 03/23/22 1141 4     Pain Loc --      Pain Edu? --      Excl. in [place]? --    No data found.  Updated Vital Signs BP (!) 176/96 (BP Location: Right Arm)   Pulse 84   Temp 99.4 F (37.4 C) (Oral)   Resp 16   SpO2 97%   Visual Acuity Right Eye Distance:   Left Eye Distance:   Bilateral Distance:    Right Eye Near:   Left Eye Near:    Bilateral Near:     Physical Exam Vitals reviewed.  Constitutional:      General: She is not in acute distress.    Appearance: She is not toxic-appearing.  HENT:     Right Ear: Tympanic membrane and ear canal normal.     Left Ear: Tympanic membrane and ear canal normal.     Nose: Congestion present.     Mouth/Throat:     Mouth: Mucous membranes are moist.     Comments: Clear mucus is draining in the oropharynx Eyes:     Extraocular Movements: Extraocular movements intact.     Conjunctiva/sclera: Conjunctivae normal.     Pupils: Pupils are equal, round, and reactive to light.  Cardiovascular:     Rate and Rhythm: Normal rate and regular rhythm.     Heart sounds: No murmur heard. Pulmonary:      Effort: No respiratory distress.     Breath sounds: No rhonchi or rales.     Comments: Lungs are clear except when she is coughing  I hear a faint wheeze Chest:     Chest wall: No tenderness.  Musculoskeletal:     Cervical back: Neck supple.  Lymphadenopathy:     Cervical: No cervical adenopathy.  Skin:    Capillary Refill: Capillary refill takes less than 2 seconds.     Coloration: Skin is not jaundiced or pale.  Neurological:     General: No focal deficit present.     Mental Status: She is alert and oriented to person, place, and time.  Psychiatric:        Behavior: Behavior normal.      UC Treatments / Results  Labs (all labs ordered are listed, but only abnormal results are displayed) Labs Reviewed  SARS CORONAVIRUS 2 (TAT 6-24 HRS)    EKG   Radiology No results found.  Procedures Procedures (including critical care time)  Medications Ordered in UC Medications - No data to display  Initial Impression / Assessment and Plan / UC Course  I have reviewed the triage vital signs and the nursing notes.  Pertinent labs & imaging results that were available during my care of the patient were reviewed by me and considered in my medical decision making (see chart for details).        I am going to treat with Tamiflu for influenza-like illness empirically, due to the shortage of flu test reagent.  She is swabbed for COVID, and if positive she would be a candidate for Paxlovid.  Her EGFR and October of this year was 25.  Also she takes Lialda but there are no interactions with Lialda and Paxlovid in up-to-date.  Also I am going to treat for asthma exacerbation with prednisone and albuterol Final Clinical Impressions(s) / UC Diagnoses   Final diagnoses:  Viral URI with cough  Mild intermittent asthma with acute exacerbation     Discharge Instructions      Albuterol inhaler--do 2 puffs every 4 hours as needed for shortness of breath or wheezing  Take prednisone  20 mg--2 daily for 5 days; this is for inflammation in your lungs  Take oseltamivir 75 mg--1 capsule 2 times daily for 5 days; this is for possible flulike illness.   You have been swabbed for COVID, and the test will result in the next 24 hours. Our staff will call you if positive. If the COVID test is positive, you should quarantine for 5 days from the start of your symptoms.   But since tomorrow is Saturday, please call our clinic tomorrow to check on results.  If your COVID test is positive you would be a candidate for an antiviral medicine that would keep you from getting more severe with COVID.       ED Prescriptions     Medication Sig Dispense Auth. Provider   albuterol (VENTOLIN HFA) 108 (90 Base) MCG/ACT inhaler Inhale 2 puffs into the lungs every 4 (four) hours as needed for wheezing or shortness of breath. 8 g Barrett Henle, MD   predniSONE (DELTASONE) 20 MG tablet Take 2 tablets (40 mg total) by mouth daily with breakfast for 5 days. 10 tablet Barrett Henle, MD   oseltamivir (TAMIFLU) 75 MG capsule Take 1 capsule (75 mg total) by mouth every 12 (twelve) hours. 10 capsule Barrett Henle, MD      PDMP not reviewed this encounter.   Barrett Henle, MD 03/23/22 [phone]

## 2022-03-23 NOTE — Discharge Instructions (Signed)
Albuterol inhaler--do 2 puffs every 4 hours as needed for shortness of breath or wheezing  Take prednisone 20 mg--2 daily for 5 days; this is for inflammation in your lungs  Take oseltamivir 75 mg--1 capsule 2 times daily for 5 days; this is for possible flulike illness.   You have been swabbed for COVID, and the test will result in the next 24 hours. Our staff will call you if positive. If the COVID test is positive, you should quarantine for 5 days from the start of your symptoms.   But since tomorrow is Saturday, please call our clinic tomorrow to check on results.  If your COVID test is positive you would be a candidate for an antiviral medicine that would keep you from getting more severe with COVID.

## 2022-03-24 ENCOUNTER — Telehealth (HOSPITAL_COMMUNITY): Payer: Self-pay | Admitting: Emergency Medicine

## 2022-03-24 ENCOUNTER — Telehealth (HOSPITAL_COMMUNITY): Payer: Self-pay | Admitting: Internal Medicine

## 2022-03-24 DIAGNOSIS — U071 COVID-19: Secondary | ICD-10-CM

## 2022-03-24 LAB — SARS CORONAVIRUS 2 (TAT 6-24 HRS): SARS Coronavirus 2: POSITIVE — AB

## 2022-03-24 MED ORDER — MOLNUPIRAVIR EUA 200MG CAPSULE: 4.0000 | ORAL_CAPSULE | Freq: Two times a day (BID) | ORAL | 0 refills | Status: AC

## 2022-03-24 NOTE — Telephone Encounter (Signed)
COVID-19 testing positive.  Molnupiravir sent to pharmacy.  Patient to take this as prescribed for the next 5 days.  CDC guidelines regarding COVID-19 quarantine discussed.  She may stop taking Tamiflu.  Follow-up with urgent care as needed.  ER precautions given.

## 2022-04-06 DIAGNOSIS — N3281 Overactive bladder: Secondary | ICD-10-CM | POA: Diagnosis not present

## 2022-04-20 DIAGNOSIS — D122 Benign neoplasm of ascending colon: Secondary | ICD-10-CM | POA: Diagnosis not present

## 2022-04-20 DIAGNOSIS — K5190 Ulcerative colitis, unspecified, without complications: Secondary | ICD-10-CM | POA: Diagnosis not present

## 2022-04-20 DIAGNOSIS — Z1211 Encounter for screening for malignant neoplasm of colon: Secondary | ICD-10-CM | POA: Diagnosis not present

## 2022-04-20 DIAGNOSIS — K6389 Other specified diseases of intestine: Secondary | ICD-10-CM | POA: Diagnosis not present

## 2022-04-20 DIAGNOSIS — K621 Rectal polyp: Secondary | ICD-10-CM | POA: Diagnosis not present

## 2022-04-20 DIAGNOSIS — D128 Benign neoplasm of rectum: Secondary | ICD-10-CM | POA: Diagnosis not present

## 2022-04-20 DIAGNOSIS — K635 Polyp of colon: Secondary | ICD-10-CM | POA: Diagnosis not present

## 2022-05-21 ENCOUNTER — Other Ambulatory Visit: Payer: Self-pay | Admitting: Family Medicine

## 2022-05-21 DIAGNOSIS — Z1231 Encounter for screening mammogram for malignant neoplasm of breast: Secondary | ICD-10-CM

## 2022-05-29 ENCOUNTER — Encounter (HOSPITAL_COMMUNITY): Payer: Self-pay

## 2022-05-29 ENCOUNTER — Ambulatory Visit (HOSPITAL_COMMUNITY)
Admission: EM | Admit: 2022-05-29 | Discharge: 2022-05-29 | Disposition: A | Discharge status: Home / Self Care | Payer: PPO | Attending: Internal Medicine | Admitting: Internal Medicine

## 2022-05-29 DIAGNOSIS — J3089 Other allergic rhinitis: Secondary | ICD-10-CM | POA: Diagnosis not present

## 2022-05-29 DIAGNOSIS — J9801 Acute bronchospasm: Secondary | ICD-10-CM

## 2022-05-29 DIAGNOSIS — J069 Acute upper respiratory infection, unspecified: Secondary | ICD-10-CM

## 2022-05-29 MED ORDER — METHYLPREDNISOLONE 4 MG PO TBPK: ORAL_TABLET | ORAL | 0 refills | Status: DC

## 2022-05-29 MED ORDER — IBUPROFEN 800 MG PO TABS
ORAL_TABLET | ORAL | Status: AC
  Filled 2022-05-29: qty 1

## 2022-05-29 NOTE — ED Triage Notes (Signed)
Pt presents to the office for coughing and nasal congestion. Pt states she has history of bronchitis. Pt is using Mucinex and her Albuterol inhaler.

## 2022-05-29 NOTE — Discharge Instructions (Addendum)
Use your inhaler every 4-6 hours when you start the cough attacks

## 2022-05-29 NOTE — ED Provider Notes (Signed)
[place]    CSN: [id number] Arrival date & time: 05/29/22  1735      History   Chief Complaint Chief Complaint  Patient presents with   Cough    HPI Jessica Stephens is a 72 y.o. female who presents with onset of nose congestion and cough x since yesterday and today she got worse with a lot of coughing even after taking Muciunex plus. She is very prone to getting bronchitis anytime she starts this type cough. Has not had a fever, but has been very fatigued.  Started her inhaler yesterday and only used it once this am. 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 As the day progressed her cough got worse and this is how she gets when she gets bronchitis. If she gets this way, normally prednisone helps. She has not taken her BP med today.     Past Medical History:  Diagnosis Date   Abnormal transaminases    Anxiety    Arthritis    Esophageal stricture    GERD (gastroesophageal reflux disease)    Hiatal hernia    Hyperlipidemia    Hypertension    Maxillary sinusitis    OAB (overactive bladder)    Proctitis    Ulcerative colitis ([place])    left sided   Vitamin B12 deficiency     Patient Active Problem List   Diagnosis Date Noted   Ulcerative colitis ([place]) 11/04/2020   Personal history of colonic polyps 11/04/2020   Age-related osteoporosis without current pathological fracture 03/11/2020   Pelvic prolapse 03/08/2020   Lower urinary tract symptoms (LUTS) 12/05/2016   Acute UTI 12/05/2016   Flank pain, acute 12/05/2016   Prolonged Q-T interval on ECG 08/21/2014   Lobar pneumonia due to unspecified organism 08/21/2014   CAP (community acquired pneumonia) 08/19/2014   Community acquired pneumonia 08/19/2014   Sepsis ([place]) 08/19/2014   Hypokalemia 08/19/2014   HYPERLIPIDEMIA 05/29/2010   DYSPNEA 05/29/2010   CHEST PAIN, ATYPICAL 05/02/2010    ESOPHAGEAL STRICTURE 04/07/2010   HIATAL HERNIA 04/07/2010   ULCERATIVE PROCTITIS 04/07/2010   ARTHRITIS 04/07/2010   LUQ PAIN 04/07/2010   TRANSAMINASES, SERUM, ELEVATED 04/07/2010   VITAMIN B12 DEFICIENCY 01/30/2008   ANXIETY, CHRONIC 01/29/2008   Essential hypertension 01/29/2008   CHRONIC MAXILLARY SINUSITIS 04/24/2007   G E R D 04/24/2007   UNSPECIFIED OSTEOPOROSIS 04/24/2007   ULCERATIVE COLITIS, LEFT SIDED 02/25/2007    Past Surgical History:  Procedure Laterality Date   CATARACT EXTRACTION     CESAREAN SECTION     nose surg     NOSE SURGERY      OB History   No obstetric history on file.      Home Medications    Prior to Admission medications   Medication Sig Start Date End Date Taking? Authorizing Provider  albuterol (VENTOLIN HFA) 108 (90 Base) MCG/ACT inhaler Inhale 2 puffs into the lungs every 4 (four) hours as needed for wheezing or shortness of breath. 03/23/22  Yes Barrett Henle, MD  amLODipine (NORVASC) 10 MG tablet Take 1 tablet (10 mg total) by mouth daily. 01/01/22  Yes [name], MD  atorvastatin (LIPITOR) 20 MG tablet Take 1 tablet (20 mg total) by mouth daily. 01/01/22  Yes [name], MD  cetirizine (ZYRTEC ALLERGY) 10 MG tablet Take 1 tablet (10 mg total) by mouth daily. 12/23/20  Yes Maximiano Coss, NP  fluticasone (FLONASE) 50 MCG/ACT nasal spray Place 2 sprays into both nostrils daily. 01/01/22  Yes [name], MD  mesalamine (LIALDA) 1.2 g EC tablet Take 1-2 tablets (1.2-2.4 g total) by mouth 2 (two) times daily. 12/23/20  Yes Maximiano Coss, NP  montelukast (SINGULAIR) 10 MG tablet Take 1 tablet (10 mg total) by mouth at bedtime. 08/22/21  Yes Maximiano Coss, NP  Multiple Vitamins-Minerals (VISION VITAMINS) TABS Take 1 tablet by mouth daily.   Yes [provider]  olmesartan (BENICAR) 20 MG tablet Take 1 tablet (20 mg total) by mouth daily. 01/01/22  Yes [name], MD  methylPREDNISolone (MEDROL DOSEPAK) 4  MG TBPK tablet Take as directed 05/29/22  Yes Rodriguez-Southworth, Sunday Spillers, PA-C    Family History Family History  Problem Relation Age of Onset   Arthritis Mother    Dementia Mother    Diabetes Father    Hyperlipidemia Father    Hypertension Father     Social History Social History   Tobacco Use   Smoking status: Never   Smokeless tobacco: Never  Vaping Use   Vaping Use: Never used  Substance Use Topics   Alcohol use: Yes    Comment: occasionally   Drug use: No     Allergies   Alendronate sodium, Other, and Sulfonamide derivatives   Review of Systems Review of Systems  Constitutional:  Positive for fatigue. Negative for appetite change, chills and fever.  HENT:  Positive for congestion, postnasal drip, rhinorrhea and sinus pressure. Negative for ear discharge and ear pain.   Respiratory:  Positive for cough. Negative for shortness of breath and wheezing.   Skin:  Negative for rash.     Physical Exam Triage Vital Signs ED Triage Vitals [05/29/22 1836]  Enc Vitals Group     BP (!) 167/83     Pulse Rate 78     Resp 18     Temp 98.3 F (36.8 C)     Temp Source Oral     SpO2 98 %     Weight      Height      Head Circumference      Peak Flow      Pain Score      Pain Loc      Pain Edu?      Excl. in [place]?    No data found.  Updated Vital Signs BP (!) 167/83   Pulse 78   Temp 98.3 F (36.8 C) (Oral)   Resp 18   SpO2 98%   Visual Acuity Right Eye Distance:   Left Eye Distance:   Bilateral Distance:    Right Eye Near:   Left Eye Near:    Bilateral Near:      Physical Exam Vitals signs and nursing note reviewed.  Constitutional:      General: She is not in acute distress.    Appearance: Normal appearance. She is not ill-appearing, toxic-appearing or diaphoretic.  HENT:     Head: Normocephalic.     Right Ear: Tympanic membrane, ear canal and external ear normal.     Left Ear: Tympanic membrane, ear canal and external ear normal.     Nose:  has moderate mucosa congestion.      Mouth/Throat: clear    Mouth: Mucous membranes are moist.  Eyes:     General: No scleral icterus.       Right eye: No discharge.        Left  eye: No discharge.     Conjunctiva/sclera: Conjunctivae normal.  Neck:     Musculoskeletal: Neck supple. No neck rigidity.  Cardiovascular:     Rate and Rhythm: Normal rate and regular rhythm.     Heart sounds: No murmur.  Pulmonary:     Effort: Pulmonary effort is normal.     Breath sounds: Normal breath sounds.  Musculoskeletal: Normal range of motion.  Lymphadenopathy:     Cervical: No cervical adenopathy.  Skin:    General: Skin is warm and dry.     Coloration: Skin is not jaundiced.     Findings: No rash.  Neurological:     Mental Status: She is alert and oriented to person, place, and time.     Gait: Gait normal.  Psychiatric:        Mood and Affect: Mood normal.        Behavior: Behavior normal.        Thought Content: Thought content normal.        Judgment: Judgment normal.    UC Treatments / Results  Labs (all labs ordered are listed, but only abnormal results are displayed) Labs Reviewed - No data to display  EKG   Radiology No results found.  Procedures Procedures (including critical care time)  Medications Ordered in UC Medications - No data to display  Initial Impression / Assessment and Plan / UC Course  I have reviewed the triage vital signs and the nursing notes. Allergic rhinitis Bronchospasm  I placed her on Medrol dose pack Advised to use her inhaler q 4-6h for 2-3 days.     Final Clinical Impressions(s) / UC Diagnoses   Final diagnoses:  Viral URI with cough     Discharge Instructions      Use your inhaler every 4-6 hours when you start the cough attacks      ED Prescriptions     Medication Sig Dispense Auth. Provider   methylPREDNISolone (MEDROL DOSEPAK) 4 MG TBPK tablet Take as directed 21 tablet Rodriguez-Southworth, Sunday Spillers, PA-C       PDMP not reviewed this encounter.   Shelby Mattocks, [place] 05/29/22 2105

## 2022-07-06 ENCOUNTER — Ambulatory Visit
Admission: RE | Admit: 2022-07-06 | Discharge: 2022-07-06 | Disposition: A | Discharge status: Home / Self Care | Payer: PPO | Source: Ambulatory Visit | Attending: Family Medicine | Admitting: Family Medicine

## 2022-07-06 DIAGNOSIS — Z1231 Encounter for screening mammogram for malignant neoplasm of breast: Secondary | ICD-10-CM | POA: Diagnosis not present

## 2022-08-09 DIAGNOSIS — L308 Other specified dermatitis: Secondary | ICD-10-CM | POA: Diagnosis not present

## 2022-10-22 ENCOUNTER — Telehealth: Payer: Self-pay

## 2022-10-24 ENCOUNTER — Telehealth: Payer: Self-pay

## 2022-10-24 NOTE — Telephone Encounter (Signed)
Reached out to patient to set up follow up visit with provider to discuss chronic conditions.  Telephone encounter attempt : 2   A HIPAA compliant voice message was left requesting a return call.  Instructed patient to call office or to call me at [phone].  Elijio Miles [facility] Health Specialist

## 2022-10-24 NOTE — Telephone Encounter (Signed)
error 

## 2022-10-31 ENCOUNTER — Telehealth: Payer: Self-pay

## 2022-10-31 NOTE — Telephone Encounter (Signed)
Reached out to patient to set up follow up visit with provider to discuss chronic conditions.  Telephone encounter attempt : 3   A HIPAA compliant voice message was left requesting a return call.  Instructed patient to call office or to call me at [phone].  Elijio Miles [facility] Health Specialist

## 2022-11-15 DIAGNOSIS — H353222 Exudative age-related macular degeneration, left eye, with inactive choroidal neovascularization: Secondary | ICD-10-CM | POA: Diagnosis not present

## 2022-11-15 DIAGNOSIS — H43813 Vitreous degeneration, bilateral: Secondary | ICD-10-CM | POA: Diagnosis not present

## 2022-11-15 DIAGNOSIS — H35373 Puckering of macula, bilateral: Secondary | ICD-10-CM | POA: Diagnosis not present

## 2022-11-15 DIAGNOSIS — H04123 Dry eye syndrome of bilateral lacrimal glands: Secondary | ICD-10-CM | POA: Diagnosis not present

## 2022-12-07 DIAGNOSIS — H35373 Puckering of macula, bilateral: Secondary | ICD-10-CM | POA: Diagnosis not present

## 2022-12-07 DIAGNOSIS — H353124 Nonexudative age-related macular degeneration, left eye, advanced atrophic with subfoveal involvement: Secondary | ICD-10-CM | POA: Diagnosis not present

## 2022-12-07 DIAGNOSIS — H353111 Nonexudative age-related macular degeneration, right eye, early dry stage: Secondary | ICD-10-CM | POA: Diagnosis not present

## 2022-12-10 ENCOUNTER — Ambulatory Visit (HOSPITAL_COMMUNITY)
Admission: EM | Admit: 2022-12-10 | Discharge: 2022-12-10 | Disposition: A | Discharge status: Home / Self Care | Payer: PPO | Attending: Emergency Medicine | Admitting: Emergency Medicine

## 2022-12-10 ENCOUNTER — Encounter (HOSPITAL_COMMUNITY): Payer: Self-pay

## 2022-12-10 DIAGNOSIS — R109 Unspecified abdominal pain: Secondary | ICD-10-CM | POA: Diagnosis not present

## 2022-12-10 DIAGNOSIS — M5432 Sciatica, left side: Secondary | ICD-10-CM

## 2022-12-10 LAB — POCT URINALYSIS DIP (MANUAL ENTRY)
Bilirubin, UA: NEGATIVE
Glucose, UA: NEGATIVE mg/dL
Ketones, POC UA: NEGATIVE mg/dL
Nitrite, UA: NEGATIVE
Protein Ur, POC: NEGATIVE mg/dL
Spec Grav, UA: 1.015 (ref 1.010–1.025)
Urobilinogen, UA: 0.2 U/dL
pH, UA: 5.5 (ref 5.0–8.0)

## 2022-12-10 MED ORDER — DEXAMETHASONE SODIUM PHOSPHATE 10 MG/ML IJ SOLN
INTRAMUSCULAR | Status: AC
  Filled 2022-12-10: qty 1

## 2022-12-10 MED ORDER — NITROFURANTOIN MONOHYD MACRO 100 MG PO CAPS: 100.0000 mg | ORAL_CAPSULE | Freq: Two times a day (BID) | ORAL | 0 refills | Status: DC

## 2022-12-10 MED ORDER — METHOCARBAMOL 500 MG PO TABS: 500.0000 mg | ORAL_TABLET | Freq: Two times a day (BID) | ORAL | 0 refills | Status: AC | PRN

## 2022-12-10 MED ORDER — DEXAMETHASONE SODIUM PHOSPHATE 10 MG/ML IJ SOLN
10.0000 mg | Freq: Once | INTRAMUSCULAR | Status: AC
  Administered 2022-12-10: 10 mg via INTRAMUSCULAR

## 2022-12-10 NOTE — ED Provider Notes (Signed)
[facility]    CSN: [id number] Arrival date & time: 12/10/22  1346      History   Chief Complaint No chief complaint on file.   HPI Jessica Stephens is a 72 y.o. female.   Patient presents with 1 week history of lower left back pain that worsens with movement. Patient reports that yesterday pain radiated down left leg with movement. Patient reports history of recurrent kidney infections and wants to make sure she does not have one today.  Denies difficulty urinating, pain with urination, blood in urine, and fever.      Past Medical History:  Diagnosis Date   Abnormal transaminases    Anxiety    Arthritis    Esophageal stricture    GERD (gastroesophageal reflux disease)    Hiatal hernia    Hyperlipidemia    Hypertension    Maxillary sinusitis    OAB (overactive bladder)    Proctitis    Ulcerative colitis (HCC)    left sided   Vitamin B12 deficiency     Patient Active Problem List   Diagnosis Date Noted   Ulcerative colitis (HCC) 11/04/2020   Personal history of colonic polyps 11/04/2020   Age-related osteoporosis without current pathological fracture 03/11/2020   Pelvic prolapse 03/08/2020   Lower urinary tract symptoms (LUTS) 12/05/2016   Acute UTI 12/05/2016   Flank pain, acute 12/05/2016   Prolonged Q-T interval on ECG 08/21/2014   Lobar pneumonia due to unspecified organism 08/21/2014   CAP (community acquired pneumonia) 08/19/2014   Community acquired pneumonia 08/19/2014   Sepsis (HCC) 08/19/2014   Hypokalemia 08/19/2014   HYPERLIPIDEMIA 05/29/2010   DYSPNEA 05/29/2010   CHEST PAIN, ATYPICAL 05/02/2010   ESOPHAGEAL STRICTURE 04/07/2010   HIATAL HERNIA 04/07/2010   ULCERATIVE PROCTITIS 04/07/2010   ARTHRITIS 04/07/2010   LUQ PAIN 04/07/2010   TRANSAMINASES, SERUM, ELEVATED 04/07/2010   VITAMIN B12 DEFICIENCY 01/30/2008   ANXIETY, CHRONIC 01/29/2008   Essential hypertension 01/29/2008   CHRONIC MAXILLARY SINUSITIS 04/24/2007   G E R D  04/24/2007   UNSPECIFIED OSTEOPOROSIS 04/24/2007   ULCERATIVE COLITIS, LEFT SIDED 02/25/2007    Past Surgical History:  Procedure Laterality Date   CATARACT EXTRACTION     CESAREAN SECTION     nose surg     NOSE SURGERY      OB History   No obstetric history on file.      Home Medications    Prior to Admission medications   Medication Sig Start Date End Date Taking? Authorizing Provider  albuterol (VENTOLIN HFA) 108 (90 Base) MCG/ACT inhaler Inhale 2 puffs into the lungs every 4 (four) hours as needed for wheezing or shortness of breath. 03/23/22  Yes Zenia Resides, MD  amLODipine (NORVASC) 10 MG tablet Take 1 tablet (10 mg total) by mouth daily. 01/01/22  Yes Shade Flood, MD  atorvastatin (LIPITOR) 20 MG tablet Take 1 tablet (20 mg total) by mouth daily. 01/01/22  Yes Shade Flood, MD  cetirizine (ZYRTEC ALLERGY) 10 MG tablet Take 1 tablet (10 mg total) by mouth daily. 12/23/20  Yes Janeece Agee, NP  methocarbamol (ROBAXIN) 500 MG tablet Take 1 tablet (500 mg total) by mouth every 12 (twelve) hours as needed for muscle spasms. 12/10/22  Yes Susann Givens, Sarim Rothman A, NP  montelukast (SINGULAIR) 10 MG tablet Take 1 tablet (10 mg total) by mouth at bedtime. 08/22/21  Yes Janeece Agee, NP  Multiple Vitamins-Minerals (VISION VITAMINS) TABS Take 1 tablet by mouth daily.  Yes [provider]  nitrofurantoin, macrocrystal-monohydrate, (MACROBID) 100 MG capsule Take 1 capsule (100 mg total) by mouth 2 (two) times daily. 12/10/22  Yes Susann Givens, Corlene Sabia A, NP  olmesartan (BENICAR) 20 MG tablet Take 1 tablet (20 mg total) by mouth daily. 01/01/22  Yes Shade Flood, MD  fluticasone (FLONASE) 50 MCG/ACT nasal spray Place 2 sprays into both nostrils daily. 01/01/22   Shade Flood, MD  mesalamine (LIALDA) 1.2 g EC tablet Take 1-2 tablets (1.2-2.4 g total) by mouth 2 (two) times daily. 12/23/20   Janeece Agee, NP  methylPREDNISolone (MEDROL DOSEPAK) 4 MG TBPK tablet Take  as directed 05/29/22   Rodriguez-Southworth, Nettie Elm, PA-C    Family History Family History  Problem Relation Age of Onset   Arthritis Mother    Dementia Mother    Diabetes Father    Hyperlipidemia Father    Hypertension Father     Social History Social History   Tobacco Use   Smoking status: Never   Smokeless tobacco: Never  Vaping Use   Vaping status: Never Used  Substance Use Topics   Alcohol use: Yes    Comment: occasionally   Drug use: No     Allergies   Alendronate sodium, Other, and Sulfonamide derivatives   Review of Systems Review of Systems  Constitutional:  Positive for activity change. Negative for chills, fatigue and fever.  Respiratory:  Negative for shortness of breath.   Cardiovascular:  Negative for chest pain.  Gastrointestinal:  Negative for abdominal pain, diarrhea, nausea and vomiting.  Genitourinary:  Negative for decreased urine volume, difficulty urinating, dysuria, flank pain, frequency, hematuria and urgency.  Musculoskeletal:  Positive for back pain.  Skin:  Negative for color change.  Neurological:  Negative for dizziness, weakness, light-headedness, numbness and headaches.     Physical Exam Triage Vital Signs ED Triage Vitals [12/10/22 1504]  Encounter Vitals Group     BP (!) 182/78     Systolic BP Percentile      Diastolic BP Percentile      Pulse Rate 80     Resp (!) 97     Temp 97.9 F (36.6 C)     Temp Source Oral     SpO2 97 %     Weight      Height      Head Circumference      Peak Flow      Pain Score      Pain Loc      Pain Education      Exclude from Growth Chart    No data found.  Updated Vital Signs BP (!) 159/79   Pulse 80   Temp 97.9 F (36.6 C) (Oral)   Resp (!) 97   SpO2 97%   Visual Acuity Right Eye Distance:   Left Eye Distance:   Bilateral Distance:    Right Eye Near:   Left Eye Near:    Bilateral Near:     Physical Exam Vitals and nursing note reviewed.  Constitutional:      General:  She is awake. She is not in acute distress.    Appearance: Normal appearance. She is well-developed and well-groomed. She is not ill-appearing, toxic-appearing or diaphoretic.  Cardiovascular:     Rate and Rhythm: Normal rate.     Heart sounds: Normal heart sounds.  Pulmonary:     Effort: Pulmonary effort is normal. No respiratory distress.     Breath sounds: Normal breath sounds. No wheezing.  Abdominal:  General: Abdomen is flat. There is no distension.     Palpations: Abdomen is soft. There is no mass.     Tenderness: There is no abdominal tenderness. There is no right CVA tenderness, left CVA tenderness, guarding or rebound.     Hernia: No hernia is present.  Musculoskeletal:        General: Tenderness present.     Cervical back: Normal and normal range of motion.     Thoracic back: Normal.     Lumbar back: Tenderness present. No swelling, edema, deformity, signs of trauma or bony tenderness. Decreased range of motion.     Comments: Mild tenderness and decreased ROM to low left back.  Skin:    General: Skin is warm and dry.  Neurological:     General: No focal deficit present.     Mental Status: She is alert and oriented to person, place, and time.     GCS: GCS eye subscore is 4. GCS verbal subscore is 5. GCS motor subscore is 6.     Sensory: Sensation is intact.     Motor: Motor function is intact.     Coordination: Coordination is intact.     Gait: Gait is intact.  Psychiatric:        Behavior: Behavior is cooperative.      UC Treatments / Results  Labs (all labs ordered are listed, but only abnormal results are displayed) Labs Reviewed  POCT URINALYSIS DIP (MANUAL ENTRY) - Abnormal; Notable for the following components:      Result Value   Blood, UA trace-intact (*)    Leukocytes, UA Moderate (2+) (*)    All other components within normal limits  URINE CULTURE    EKG   Radiology No results found.  Procedures Procedures (including critical care  time)  Medications Ordered in UC Medications  dexamethasone (DECADRON) injection 10 mg (10 mg Intramuscular Given 12/10/22 1616)    Initial Impression / Assessment and Plan / UC Course  I have reviewed the triage vital signs and the nursing notes.  Pertinent labs & imaging results that were available during my care of the patient were reviewed by me and considered in my medical decision making (see chart for details).     Patient presented with 1 week history of lower left back pain that worsens with movement.  Patient reports that yesterday pain radiated down left leg with movement.  Patient reports history of recurrent kidney infections and wants to make sure patient does not have one today. Reports intermittent pain with urination over the past few months. Denies difficulty urinating, blood in urine, and fever. Upon assessment patient has low left back tenderness with palpation, but does not have CVA tenderness. Ordered UA which revealed some leukocytes and RBCs. Will send for culture. Given history of recurrent kidney infections will start patient on Macrobid. Patient given steroid injection in clinic for sciatica pain.  Prescribed Robaxin as needed for pain and muscle spasms.  Informed patient that someone would call her over the next few days for positive results and adjust treatment if necessary. Discussed follow-up, return, emergency department precautions. Final Clinical Impressions(s) / UC Diagnoses   Final diagnoses:  Sciatica of left side  Acute left flank pain     Discharge Instructions      Please start taking Macrobid for the next 5 days. You can take Robaxin every 12 hours as needed for back pain and spasms. We are sending a urine culture, someone will call you in  the next few days if this comes back positive and will adjust treatment plan as necessary. Otherwise try to get some rest over the next few days and stay hydrated. You can follow-up with PCP or return here if  symptoms persist. If you develop severe back pain, high fever, or blood in urine please seek immediate medical treatment at the emergency department.      ED Prescriptions     Medication Sig Dispense Auth. Provider   nitrofurantoin, macrocrystal-monohydrate, (MACROBID) 100 MG capsule Take 1 capsule (100 mg total) by mouth 2 (two) times daily. 10 capsule Wynonia Lawman A, NP   methocarbamol (ROBAXIN) 500 MG tablet Take 1 tablet (500 mg total) by mouth every 12 (twelve) hours as needed for muscle spasms. 20 tablet Wynonia Lawman A, NP      PDMP not reviewed this encounter.   Wynonia Lawman A, NP 12/10/22 1635

## 2022-12-10 NOTE — Discharge Instructions (Addendum)
Please start taking Macrobid for the next 5 days. You can take Robaxin every 12 hours as needed for back pain and spasms. We are sending a urine culture, someone will call you in the next few days if this comes back positive and will adjust treatment plan as necessary. Otherwise try to get some rest over the next few days and stay hydrated. You can follow-up with PCP or return here if symptoms persist. If you develop severe back pain, high fever, or blood in urine please seek immediate medical treatment at the emergency department.

## 2022-12-10 NOTE — ED Triage Notes (Signed)
Pt is here for lower back pain x 3 days. Pt reports pain is radiating down her left leg. 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

## 2022-12-12 LAB — URINE CULTURE

## 2022-12-21 DIAGNOSIS — H353124 Nonexudative age-related macular degeneration, left eye, advanced atrophic with subfoveal involvement: Secondary | ICD-10-CM | POA: Diagnosis not present

## 2022-12-21 DIAGNOSIS — H353111 Nonexudative age-related macular degeneration, right eye, early dry stage: Secondary | ICD-10-CM | POA: Diagnosis not present

## 2023-01-03 ENCOUNTER — Ambulatory Visit (INDEPENDENT_AMBULATORY_CARE_PROVIDER_SITE_OTHER): Payer: PPO | Admitting: *Deleted

## 2023-01-03 DIAGNOSIS — Z0000 Encounter for general adult medical examination without abnormal findings: Secondary | ICD-10-CM

## 2023-01-03 NOTE — Progress Notes (Signed)
Subjective:   Jessica Stephens is a 72 y.o. female who presents for Medicare Annual (Subsequent) preventive examination.  Visit Complete: Virtual  I connected with  Jessica Stephens on 01/03/23 by a audio enabled telemedicine application and verified that I am speaking with the correct person using two identifiers.  Patient Location: Home  Provider Location: Home Office  I discussed the limitations of evaluation and management by telemedicine. The patient expressed understanding and agreed to proceed.  Because this visit was a virtual/telehealth visit, some criteria may be missing or patient reported. Any vitals not documented were not able to be obtained and vitals that have been documented are patient reported.    Cardiac Risk Factors include: advanced age (>80men, >33 women);hypertension     Objective:    There were no vitals filed for this visit. There is no height or weight on file to calculate BMI.     01/03/2023    1:45 PM 02/29/2020    8:50 AM 12/22/2018   10:55 AM 06/26/2015    7:59 PM 08/19/2014    7:52 PM 08/19/2014    1:38 PM  Advanced Directives  Does Patient Have a Medical Advance Directive? Yes No No No No No  Type of Science writer of Healthcare Power of Attorney in Chart? No - copy requested       Would patient like information on creating a medical advance directive?  Yes (Inpatient - patient defers creating a medical advance directive at this time - Information given) Yes (ED - Information included in AVS)  No - patient declined information     Current Medications (verified) Outpatient Encounter Medications as of 01/03/2023  Medication Sig   albuterol (VENTOLIN HFA) 108 (90 Base) MCG/ACT inhaler Inhale 2 puffs into the lungs every 4 (four) hours as needed for wheezing or shortness of breath.   amLODipine (NORVASC) 10 MG tablet Take 1 tablet (10 mg total) by mouth daily.   atorvastatin (LIPITOR) 20 MG tablet Take 1  tablet (20 mg total) by mouth daily.   cetirizine (ZYRTEC ALLERGY) 10 MG tablet Take 1 tablet (10 mg total) by mouth daily.   fluticasone (FLONASE) 50 MCG/ACT nasal spray Place 2 sprays into both nostrils daily.   mesalamine (LIALDA) 1.2 g EC tablet Take 1-2 tablets (1.2-2.4 g total) by mouth 2 (two) times daily.   methocarbamol (ROBAXIN) 500 MG tablet Take 1 tablet (500 mg total) by mouth every 12 (twelve) hours as needed for muscle spasms.   methylPREDNISolone (MEDROL DOSEPAK) 4 MG TBPK tablet Take as directed   montelukast (SINGULAIR) 10 MG tablet Take 1 tablet (10 mg total) by mouth at bedtime.   Multiple Vitamins-Minerals (VISION VITAMINS) TABS Take 1 tablet by mouth daily.   nitrofurantoin, macrocrystal-monohydrate, (MACROBID) 100 MG capsule Take 1 capsule (100 mg total) by mouth 2 (two) times daily.   olmesartan (BENICAR) 20 MG tablet Take 1 tablet (20 mg total) by mouth daily.   No facility-administered encounter medications on file as of 01/03/2023.    Allergies (verified) Alendronate sodium, Other, and Sulfonamide derivatives   History: Past Medical History:  Diagnosis Date   Abnormal transaminases    Anxiety    Arthritis    Esophageal stricture    GERD (gastroesophageal reflux disease)    Hiatal hernia    Hyperlipidemia    Hypertension    Maxillary sinusitis    OAB (overactive bladder)    Proctitis  Ulcerative colitis (HCC)    left sided   Vitamin B12 deficiency    Past Surgical History:  Procedure Laterality Date   CATARACT EXTRACTION     CESAREAN SECTION     nose surg     NOSE SURGERY     Family History  Problem Relation Age of Onset   Arthritis Mother    Dementia Mother    Diabetes Father    Hyperlipidemia Father    Hypertension Father    Social History   Socioeconomic History   Marital status: Married    Spouse name: Not on file   Number of children: Not on file   Years of education: Not on file   Highest education level: Not on file   Occupational History   Not on file  Tobacco Use   Smoking status: Never   Smokeless tobacco: Never  Vaping Use   Vaping status: Never Used  Substance and Sexual Activity   Alcohol use: Yes    Comment: occasionally   Drug use: No   Sexual activity: Not Currently  Other Topics Concern   Not on file  Social History Narrative   Not on file   Social Determinants of Health   Financial Resource Strain: Low Risk  (01/03/2023)   Overall Financial Resource Strain (CARDIA)    Difficulty of Paying Living Expenses: Not hard at all  Food Insecurity: No Food Insecurity (01/03/2023)   Hunger Vital Sign    Worried About Running Out of Food in the Last Year: Never true    Ran Out of Food in the Last Year: Never true  Transportation Needs: No Transportation Needs (01/03/2023)   PRAPARE - Administrator, Civil Service (Medical): No    Lack of Transportation (Non-Medical): No  Physical Activity: Sufficiently Active (01/03/2023)   Exercise Vital Sign    Days of Exercise per Week: 4 days    Minutes of Exercise per Session: 50 min  Stress: No Stress Concern Present (01/03/2023)   [employer] of Occupational Health - Occupational Stress Questionnaire    Feeling of Stress : Not at all  Social Connections: Moderately Integrated (01/03/2023)   Social Connection and Isolation Panel [NHANES]    Frequency of Communication with Friends and Family: More than three times a week    Frequency of Social Gatherings with Friends and Family: More than three times a week    Attends Religious Services: Never    Database administrator or Organizations: Yes    Attends Engineer, structural: More than 4 times per year    Marital Status: Married    Tobacco Counseling Counseling given: Not Answered   Clinical Intake:  Pre-visit preparation completed: Yes  Pain : No/denies pain     Diabetes: No  How often do you need to have someone help you when you read instructions, pamphlets, or  other written materials from your doctor or pharmacy?: 1 - Never  Interpreter Needed?: No  Information entered by :: Remi Haggard LPN   Activities of Daily Living    01/03/2023    1:40 PM  In your present state of health, do you have any difficulty performing the following activities:  Hearing? 0  Vision? 0  Difficulty concentrating or making decisions? 0  Walking or climbing stairs? 0  Dressing or bathing? 0  Doing errands, shopping? 0  Preparing Food and eating ? N  Using the Toilet? N  In the past six months, have you accidently leaked urine?  Y  Do you have problems with loss of bowel control? N  Managing your Medications? N  Managing your Finances? N  Housekeeping or managing your Housekeeping? N    Patient Care Team: Shade Flood, MD as PCP - General (Family Medicine)  Indicate any recent Medical Services you may have received from other than [facility] providers in the past year (date may be approximate).     Assessment:   This is a routine wellness examination for Saxman.  Hearing/Vision screen Hearing Screening - Comments:: Does not wear hearing aids Has trouble whispers Vision Screening - Comments:: Up to date [facility]/  macular degeneration Has had cataract surgery   Goals Addressed             This Visit's Progress    Patient Stated       Continue current lifestyle       Depression Screen    01/03/2023    1:49 PM 01/01/2022   12:54 PM 08/22/2021    9:02 AM 08/08/2021    8:49 AM 06/26/2021    1:25 PM 12/27/2020   12:29 PM 12/23/2020   10:28 AM  PHQ 2/9 Scores  PHQ - 2 Score 0 0 0 0 0 0 0  PHQ- 9 Score 4  0 0  0 0    Fall Risk    01/03/2023    1:43 PM 01/01/2022   12:54 PM 08/22/2021    9:03 AM 08/08/2021    8:49 AM 06/26/2021    1:25 PM  Fall Risk   Falls in the past year? 0 0 0 0 0  Number falls in past yr: 0 0 0 0 0  Injury with Fall? 0 0 0 0 0  Risk for fall due to :  No Fall Risks No Fall Risks History of fall(s) No Fall Risks  Follow up  Falls evaluation completed;Education provided;Falls prevention discussed Falls evaluation completed Falls evaluation completed Falls evaluation completed Falls evaluation completed    MEDICARE RISK AT HOME: Medicare Risk at Home If so, are there any without handrails?: Yes Home free of loose throw rugs in walkways, pet beds, electrical cords, etc?: Yes Adequate lighting in your home to reduce risk of falls?: Yes Life alert?: No Use of a cane, walker or w/c?: No Grab bars in the bathroom?: No Shower chair or bench in shower?: Yes Elevated toilet seat or a handicapped toilet?: No  TIMED UP AND GO:  Was the test performed?  No    Cognitive Function:        01/03/2023    1:46 PM 02/29/2020    8:41 AM 12/22/2018   10:51 AM  6CIT Screen  What Year? 0 points 0 points 0 points  What month? 0 points 3 points 0 points  What time? 0 points 3 points 0 points  Count back from 20 0 points 2 points 0 points  Months in reverse 2 points 0 points 0 points  Repeat phrase 8 points 8 points 2 points  Total Score 10 points 16 points 2 points    Immunizations Immunization History  Administered Date(s) Administered   Fluad Quad(high Dose 65+) 12/08/2018, 12/23/2020   Influenza Split 01/03/2012   Influenza Whole 01/30/2008   Influenza, High Dose Seasonal PF 01/26/2018   Influenza,inj,Quad PF,6+ Mos 01/22/2013, 01/27/2014, 02/08/2015, 01/20/2016   Influenza-Unspecified 02/15/2017, 12/28/2019   PFIZER(Purple Top)SARS-COV-2 Vaccination 05/17/2019, 06/09/2019, 01/16/2020, 08/16/2020, 03/01/2021   Pneumococcal Conjugate-13 01/27/2014   Pneumococcal Polysaccharide-23 12/22/2018   Tdap 02/08/2015   Zoster  Recombinant(Shingrix) 04/24/2021   Zoster, Unspecified 04/24/2021    TDAP status: Up to date  Flu Vaccine status: Due, Education has been provided regarding the importance of this vaccine. Advised may receive this vaccine at local pharmacy or Health Dept. Aware to provide a copy of the  vaccination record if obtained from local pharmacy or Health Dept. Verbalized acceptance and understanding.  Pneumococcal vaccine status: Up to date  Covid-19 vaccine status: Information provided on how to obtain vaccines.   Qualifies for Shingles Vaccine? Yes   Zostavax completed No   Shingrix Completed?: Yes  Screening Tests Health Maintenance  Topic Date Due   COVID-19 Vaccine (6 - 2023-24 season) 01/19/2023 (Originally 12/02/2022)   Zoster Vaccines- Shingrix (2 of 2) 04/05/2023 (Originally 06/19/2021)   INFLUENZA VACCINE  07/01/2023 (Originally 11/01/2022)   Medicare Annual Wellness (AWV)  01/03/2024   MAMMOGRAM  07/05/2024   DTaP/Tdap/Td (2 - Td or Tdap) 02/07/2025   Colonoscopy  04/20/2032   Pneumonia Vaccine 41+ Years old  Completed   DEXA SCAN  Completed   Hepatitis C Screening  Completed   HPV VACCINES  Aged Out    Health Maintenance  There are no preventive care reminders to display for this patient.   Colorectal cancer screening: Type of screening: Colonoscopy. Completed 2024. Repeat every 10 years  Mammogram status: Completed  . Repeat every year  Bone Density status: Completed 2020. Results reflect: Bone density results: OSTEOPOROSIS. Repeat every 2 years.  Lung Cancer Screening: (Low Dose CT Chest recommended if Age 70-80 years, 20 pack-year currently smoking OR have quit w/in 15years.) does not qualify.   Lung Cancer Screening Referral:   Additional Screening:  Hepatitis C Screening: does not qualify; Completed 2012  Vision Screening: Recommended annual ophthalmology exams for early detection of glaucoma and other disorders of the eye. Is the patient up to date with their annual eye exam?  Yes  Who is the provider or what is the name of the office in which the patient attends annual eye exams? [facility] If pt is not established with a provider, would they like to be referred to a provider to establish care? No .   Dental Screening: Recommended annual dental  exams for proper oral hygiene    Community Resource Referral / Chronic Care Management: CRR required this visit?  No   CCM required this visit?  No     Plan:     I have personally reviewed and noted the following in the patient's chart:   Medical and social history Use of alcohol, tobacco or illicit drugs  Current medications and supplements including opioid prescriptions. Patient is not currently taking opioid prescriptions. Functional ability and status Nutritional status Physical activity Advanced directives List of other physicians Hospitalizations, surgeries, and ER visits in previous 12 months Vitals Screenings to include cognitive, depression, and falls Referrals and appointments  In addition, I have reviewed and discussed with patient certain preventive protocols, quality metrics, and best practice recommendations. A written personalized care plan for preventive services as well as general preventive health recommendations were provided to patient.     Remi Haggard, LPN   29/08/6211   After Visit Summary: (MyChart) Due to this being a telephonic visit, the after visit summary with patients personalized plan was offered to patient via MyChart   Nurse Notes:

## 2023-01-03 NOTE — Patient Instructions (Signed)
Jessica Stephens , Thank you for taking time to come for your Medicare Wellness Visit. I appreciate your ongoing commitment to your health goals. Please review the following plan we discussed and let me know if I can assist you in the future.   Screening recommendations/referrals: Colonoscopy: up to date Mammogram: up to date Bone Density: Education provided Recommended yearly ophthalmology/optometry visit for glaucoma screening and checkup Recommended yearly dental visit for hygiene and checkup  Vaccinations: Influenza vaccine: Education provided Pneumococcal vaccine: up to date Tdap vaccine: up to date Shingles vaccine: Education provided    Advanced directives: yes not on file     Preventive Care 65 Years and Older, Female Preventive care refers to lifestyle choices and visits with your health care provider that can promote health and wellness. What does preventive care include? A yearly physical exam. This is also called an annual well check. Dental exams once or twice a year. Routine eye exams. Ask your health care provider how often you should have your eyes checked. Personal lifestyle choices, including: Daily care of your teeth and gums. Regular physical activity. Eating a healthy diet. Avoiding tobacco and drug use. Limiting alcohol use. Practicing safe sex. Taking low-dose aspirin every day. Taking vitamin and mineral supplements as recommended by your health care provider. What happens during an annual well check? The services and screenings done by your health care provider during your annual well check will depend on your age, overall health, lifestyle risk factors, and family history of disease. Counseling  Your health care provider may ask you questions about your: Alcohol use. Tobacco use. Drug use. Emotional well-being. Home and relationship well-being. Sexual activity. Eating habits. History of falls. Memory and ability to understand (cognition). Work and  work Astronomer. Reproductive health. Screening  You may have the following tests or measurements: Height, weight, and BMI. Blood pressure. Lipid and cholesterol levels. These may be checked every 5 years, or more frequently if you are over 9 years old. Skin check. Lung cancer screening. You may have this screening every year starting at age 46 if you have a 30-pack-year history of smoking and currently smoke or have quit within the past 15 years. Fecal occult blood test (FOBT) of the stool. You may have this test every year starting at age 66. Flexible sigmoidoscopy or colonoscopy. You may have a sigmoidoscopy every 5 years or a colonoscopy every 10 years starting at age 15. Hepatitis C blood test. Hepatitis B blood test. Sexually transmitted disease (STD) testing. Diabetes screening. This is done by checking your blood sugar (glucose) after you have not eaten for a while (fasting). You may have this done every 1-3 years. Bone density scan. This is done to screen for osteoporosis. You may have this done starting at age 47. Mammogram. This may be done every 1-2 years. Talk to your health care provider about how often you should have regular mammograms. Talk with your health care provider about your test results, treatment options, and if necessary, the need for more tests. Vaccines  Your health care provider may recommend certain vaccines, such as: Influenza vaccine. This is recommended every year. Tetanus, diphtheria, and acellular pertussis (Tdap, Td) vaccine. You may need a Td booster every 10 years. Zoster vaccine. You may need this after age 85. Pneumococcal 13-valent conjugate (PCV13) vaccine. One dose is recommended after age 1. Pneumococcal polysaccharide (PPSV23) vaccine. One dose is recommended after age 47. Talk to your health care provider about which screenings and vaccines you need and how  often you need them. This information is not intended to replace advice given to you  by your health care provider. Make sure you discuss any questions you have with your health care provider. Document Released: 04/15/2015 Document Revised: 12/07/2015 Document Reviewed: 01/18/2015 Elsevier Interactive Patient Education  2017 [employer].  Fall Prevention in the Home Falls can cause injuries. They can happen to people of all ages. There are many things you can do to make your home safe and to help prevent falls. What can I do on the outside of my home? Regularly fix the edges of walkways and driveways and fix any cracks. Remove anything that might make you trip as you walk through a door, such as a raised step or threshold. Trim any bushes or trees on the path to your home. Use bright outdoor lighting. Clear any walking paths of anything that might make someone trip, such as rocks or tools. Regularly check to see if handrails are loose or broken. Make sure that both sides of any steps have handrails. Any raised decks and porches should have guardrails on the edges. Have any leaves, snow, or ice cleared regularly. Use sand or salt on walking paths during winter. Clean up any spills in your garage right away. This includes oil or grease spills. What can I do in the bathroom? Use night lights. Install grab bars by the toilet and in the tub and shower. Do not use towel bars as grab bars. Use non-skid mats or decals in the tub or shower. If you need to sit down in the shower, use a plastic, non-slip stool. Keep the floor dry. Clean up any water that spills on the floor as soon as it happens. Remove soap buildup in the tub or shower regularly. Attach bath mats securely with double-sided non-slip rug tape. Do not have throw rugs and other things on the floor that can make you trip. What can I do in the bedroom? Use night lights. Make sure that you have a light by your bed that is easy to reach. Do not use any sheets or blankets that are too big for your bed. They should not  hang down onto the floor. Have a firm chair that has side arms. You can use this for support while you get dressed. Do not have throw rugs and other things on the floor that can make you trip. What can I do in the kitchen? Clean up any spills right away. Avoid walking on wet floors. Keep items that you use a lot in easy-to-reach places. If you need to reach something above you, use a strong step stool that has a grab bar. Keep electrical cords out of the way. Do not use floor polish or wax that makes floors slippery. If you must use wax, use non-skid floor wax. Do not have throw rugs and other things on the floor that can make you trip. What can I do with my stairs? Do not leave any items on the stairs. Make sure that there are handrails on both sides of the stairs and use them. Fix handrails that are broken or loose. Make sure that handrails are as long as the stairways. Check any carpeting to make sure that it is firmly attached to the stairs. Fix any carpet that is loose or worn. Avoid having throw rugs at the top or bottom of the stairs. If you do have throw rugs, attach them to the floor with carpet tape. Make sure that you have a  light switch at the top of the stairs and the bottom of the stairs. If you do not have them, ask someone to add them for you. What else can I do to help prevent falls? Wear shoes that: Do not have high heels. Have rubber bottoms. Are comfortable and fit you well. Are closed at the toe. Do not wear sandals. If you use a stepladder: Make sure that it is fully opened. Do not climb a closed stepladder. Make sure that both sides of the stepladder are locked into place. Ask someone to hold it for you, if possible. Clearly mark and make sure that you can see: Any grab bars or handrails. First and last steps. Where the edge of each step is. Use tools that help you move around (mobility aids) if they are needed. These  include: Canes. Walkers. Scooters. Crutches. Turn on the lights when you go into a dark area. Replace any light bulbs as soon as they burn out. Set up your furniture so you have a clear path. Avoid moving your furniture around. If any of your floors are uneven, fix them. If there are any pets around you, be aware of where they are. Review your medicines with your doctor. Some medicines can make you feel dizzy. This can increase your chance of falling. Ask your doctor what other things that you can do to help prevent falls. This information is not intended to replace advice given to you by your health care provider. Make sure you discuss any questions you have with your health care provider. Document Released: 01/13/2009 Document Revised: 08/25/2015 Document Reviewed: 04/23/2014 Elsevier Interactive Patient Education  2017 [employer].

## 2023-01-04 ENCOUNTER — Other Ambulatory Visit: Payer: Self-pay | Admitting: Family Medicine

## 2023-01-04 DIAGNOSIS — I10 Essential (primary) hypertension: Secondary | ICD-10-CM

## 2023-01-07 ENCOUNTER — Telehealth: Payer: Self-pay | Admitting: Family Medicine

## 2023-01-07 ENCOUNTER — Other Ambulatory Visit: Payer: Self-pay | Admitting: Family Medicine

## 2023-01-07 DIAGNOSIS — E785 Hyperlipidemia, unspecified: Secondary | ICD-10-CM

## 2023-01-07 NOTE — Telephone Encounter (Signed)
Pt has developed symptoms of a cold - sneezing, coughing, stuffiness, runny nose, she describes it feels like hay fever She wants to know if it is safe, because of her BP meds, for her to take Dayquil/Nyquil or Benadryl over the counter for her symptoms She does have a physical scheduled on 10/10

## 2023-01-07 NOTE — Telephone Encounter (Signed)
I advised pt of needing a separate appt. She wants to try and self treat at home and wants to know if it is safe for her to take Dayquil/Nyquil or Benadryl due to her having Hypertension.

## 2023-01-07 NOTE — Telephone Encounter (Signed)
Patient needs a separate appt to get medication or advise on acute issue

## 2023-01-08 NOTE — Telephone Encounter (Signed)
Spoke to patient and let her know what Dr. Neva Seat advised and she wrote the name of the Coricidin down. She did not have any questions at this time.

## 2023-01-08 NOTE — Telephone Encounter (Signed)
Typically I recommend Coricidin HBP as that should have minimal effect on blood pressure.  Fluids and rest, and I do recommend she is seen if any new or worsening symptoms.  In regards to allergy type symptoms, temporary Benadryl would probably be okay if needed or nonsedating antihistamine like Claritin with close monitoring of blood pressure.  Let me know if there are further questions.

## 2023-01-08 NOTE — Telephone Encounter (Signed)
Can patient take OTC cough /cold medication with her Hypertension or is there something specific she should use.

## 2023-01-10 ENCOUNTER — Ambulatory Visit: Payer: PPO | Admitting: Family Medicine

## 2023-01-10 ENCOUNTER — Encounter: Payer: Self-pay | Admitting: Family Medicine

## 2023-01-10 VITALS — BP 126/74 | HR 79 | Temp 98.4°F | Ht 60.0 in | Wt 148.4 lb

## 2023-01-10 DIAGNOSIS — J309 Allergic rhinitis, unspecified: Secondary | ICD-10-CM

## 2023-01-10 DIAGNOSIS — Z0000 Encounter for general adult medical examination without abnormal findings: Secondary | ICD-10-CM | POA: Diagnosis not present

## 2023-01-10 DIAGNOSIS — I10 Essential (primary) hypertension: Secondary | ICD-10-CM | POA: Diagnosis not present

## 2023-01-10 DIAGNOSIS — E785 Hyperlipidemia, unspecified: Secondary | ICD-10-CM | POA: Diagnosis not present

## 2023-01-10 DIAGNOSIS — R0981 Nasal congestion: Secondary | ICD-10-CM

## 2023-01-10 DIAGNOSIS — K5100 Ulcerative (chronic) pancolitis without complications: Secondary | ICD-10-CM | POA: Diagnosis not present

## 2023-01-10 DIAGNOSIS — E782 Mixed hyperlipidemia: Secondary | ICD-10-CM | POA: Diagnosis not present

## 2023-01-10 DIAGNOSIS — E669 Obesity, unspecified: Secondary | ICD-10-CM | POA: Diagnosis not present

## 2023-01-10 LAB — POC COVID19 BINAXNOW: SARS Coronavirus 2 Ag: NEGATIVE

## 2023-01-10 MED ORDER — FLUTICASONE PROPIONATE 50 MCG/ACT NA SUSP: 2.0000 | Freq: Every day | NASAL | 2 refills | Status: DC

## 2023-01-10 MED ORDER — ATORVASTATIN CALCIUM 20 MG PO TABS: 20.0000 mg | ORAL_TABLET | Freq: Every day | ORAL | 0 refills | Status: DC

## 2023-01-10 MED ORDER — AMLODIPINE BESYLATE 10 MG PO TABS: 10.0000 mg | ORAL_TABLET | Freq: Every day | ORAL | 0 refills | Status: DC

## 2023-01-10 MED ORDER — OLMESARTAN MEDOXOMIL 20 MG PO TABS: 20.0000 mg | ORAL_TABLET | Freq: Every day | ORAL | 3 refills | Status: DC

## 2023-01-10 NOTE — Progress Notes (Signed)
Subjective:  Patient ID: Jessica Stephens, female    DOB: Jan 05, 1951  Age: 72 y.o. MRN: [id number]  CC:  Chief Complaint  Patient presents with   Annual Exam    Pt still getting over sinus infection, pt notes no questions     HPI [name] presents for Annual Exam PCP, me Dermatology, Dr. Margo Aye, seeing for rash on legs.  Gastroenterology, Dr. Loreta Ave, history of ulcerative colitis, Urology, Dr. Ashley Royalty, overactive bladder  Recent URI with sinus congestion, see telephone note, Coricidin HBP recommended for symptomatic care.  Previous history of allergies treated with Zyrtec, Flonase as needed, Astelin nasal spray as needed and albuterol as needed. Frequent nasal congestion - started after husband mowed grass. Sore throat initially. Not using albuterol - has used saline NS.  Has tried coricidin, vitamin C, and B12.  No fever. Still sinus pressure, some discoloration. Not seen today.  Has not tried flonase but taking generic zyrtec. Some body ache this am. 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 Husband had runny nose as well. Some blockage in left ear.   Hyperlipidemia: Lipitor 20 mg daily, no new side effects or new myalgias.  Lab Results  Component Value Date   CHOL 142 01/01/2022   HDL 56.40 01/01/2022   LDLCALC 66 01/01/2022   TRIG 97.0 01/01/2022   CHOLHDL 3 01/01/2022   Lab Results  Component Value Date   ALT 21 01/01/2022   AST 22 01/01/2022   ALKPHOS 110 01/01/2022   BILITOT 0.5 01/01/2022   Hypertension: Benicar 20 mg daily, amlodipine 10 mg daily. Increased water intake has helped.  Has taken meds at different times depending on days off work - takes later at times.  Home readings: none.  BP Readings from Last 3 Encounters:  01/10/23 126/74  12/10/22 (!) 159/79  05/29/22 (!) 167/83   Lab Results  Component Value Date   CREATININE 0.72 01/01/2022           01/10/2023   12:59 PM 01/03/2023    1:49 PM 01/01/2022   12:54 PM 08/22/2021    9:02 AM 08/08/2021     8:49 AM  Depression screen PHQ 2/9  Decreased Interest 0 0 0 0 0  Down, Depressed, Hopeless 0 0 0 0 0  PHQ - 2 Score 0 0 0 0 0  Altered sleeping 1 2  0 0  Tired, decreased energy 1 2  0 0  Change in appetite 0 0  0 0  Feeling bad or failure about yourself  0 0  0 0  Trouble concentrating 0 0  0 0  Moving slowly or fidgety/restless 0 0  0 0  Suicidal thoughts 0 0  0 0  PHQ-9 Score 2 4  0 0  Difficult doing work/chores  Not difficult at all  Not difficult at all Not difficult at all    Health Maintenance  Topic Date Due   COVID-19 Vaccine (6 - 2023-24 season) 01/19/2023 (Originally 12/02/2022)   Zoster Vaccines- Shingrix (2 of 2) 04/05/2023 (Originally 06/19/2021)   INFLUENZA VACCINE  07/01/2023 (Originally 11/01/2022)   Medicare Annual Wellness (AWV)  01/03/2024   MAMMOGRAM  07/05/2024   DTaP/Tdap/Td (2 - Td or Tdap) 02/07/2025   Colonoscopy  04/20/2032   Pneumonia Vaccine 47+ Years old  Completed   DEXA SCAN  Completed   Hepatitis C Screening  Completed   HPV VACCINES  Aged Out  Colonoscopy earlier this year - Dr. Loreta Ave.  Mammogram 07/06/22   Immunization History  Administered Date(s) Administered   Fluad Quad(high Dose 65+) 12/08/2018, 12/23/2020   Influenza Split 01/03/2012   Influenza Whole 01/30/2008   Influenza, High Dose Seasonal PF 01/26/2018   Influenza,inj,Quad PF,6+ Mos 01/22/2013, 01/27/2014, 02/08/2015, 01/20/2016   Influenza-Unspecified 02/15/2017, 12/28/2019   PFIZER(Purple Top)SARS-COV-2 Vaccination 05/17/2019, 06/09/2019, 01/16/2020, 08/16/2020, 03/01/2021   Pneumococcal Conjugate-13 01/27/2014   Pneumococcal Polysaccharide-23 12/22/2018   Tdap 02/08/2015   Zoster Recombinant(Shingrix) 04/24/2021   Zoster, Unspecified 04/24/2021  Flu vaccine at [facility] later this month.  Refuses covid booster.   No results found. Optho - [facility] - saw in July. 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   Dental: plans on new dentist in  February.   Alcohol: rare glass of wine - less than once per week.   Tobacco: none.   Exercise: physically active job.    History Patient Active Problem List   Diagnosis Date Noted   Ulcerative colitis (HCC) 11/04/2020   History of colonic polyps 11/04/2020   Age-related osteoporosis without current pathological fracture 03/11/2020   Pelvic prolapse 03/08/2020   Lower urinary tract symptoms (LUTS) 12/05/2016   Acute UTI 12/05/2016   Flank pain, acute 12/05/2016   Prolonged Q-T interval on ECG 08/21/2014   Lobar pneumonia (HCC) 08/21/2014   CAP (community acquired pneumonia) 08/19/2014   Community acquired pneumonia 08/19/2014   Sepsis (HCC) 08/19/2014   Hypokalemia 08/19/2014   HYPERLIPIDEMIA 05/29/2010   DYSPNEA 05/29/2010   CHEST PAIN, ATYPICAL 05/02/2010   ESOPHAGEAL STRICTURE 04/07/2010   Diaphragmatic hernia 04/07/2010   ULCERATIVE PROCTITIS 04/07/2010   Arthropathy 04/07/2010   LUQ PAIN 04/07/2010   TRANSAMINASES, SERUM, ELEVATED 04/07/2010   VITAMIN B12 DEFICIENCY 01/30/2008   ANXIETY, CHRONIC 01/29/2008   Essential hypertension 01/29/2008   CHRONIC MAXILLARY SINUSITIS 04/24/2007   G E R D 04/24/2007   UNSPECIFIED OSTEOPOROSIS 04/24/2007   ULCERATIVE COLITIS, LEFT SIDED 02/25/2007   Past Medical History:  Diagnosis Date   Abnormal transaminases    Anxiety    Arthritis    Esophageal stricture    GERD (gastroesophageal reflux disease)    Hiatal hernia    Hyperlipidemia    Hypertension    Maxillary sinusitis    OAB (overactive bladder)    Proctitis    Ulcerative colitis (HCC)    left sided   Vitamin B12 deficiency    Past Surgical History:  Procedure Laterality Date   CATARACT EXTRACTION     CESAREAN SECTION     nose surg     NOSE SURGERY     Allergies  Allergen Reactions   Alendronate Sodium    Other Other (See Comments)    "messes up blood"    Sulfonamide Derivatives    Prior to Admission medications   Medication Sig Start Date End Date  Taking? Authorizing Provider  albuterol (VENTOLIN HFA) 108 (90 Base) MCG/ACT inhaler Inhale 2 puffs into the lungs every 4 (four) hours as needed for wheezing or shortness of breath. 03/23/22  Yes Zenia Resides, MD  amLODipine (NORVASC) 10 MG tablet TAKE 1 TABLET BY MOUTH EVERY DAY 01/04/23  Yes Shade Flood, MD  atorvastatin (LIPITOR) 20 MG tablet TAKE 1 TABLET BY MOUTH EVERY DAY 01/08/23  Yes Shade Flood, MD  cetirizine (ZYRTEC ALLERGY) 10 MG tablet Take 1 tablet (10 mg total) by mouth daily. 12/23/20  Yes Janeece Agee, NP  mesalamine (LIALDA) 1.2 g EC tablet Take 1-2 tablets (1.2-2.4 g total) by mouth 2 (two) times daily.  12/23/20  Yes Janeece Agee, NP  methocarbamol (ROBAXIN) 500 MG tablet Take 1 tablet (500 mg total) by mouth every 12 (twelve) hours as needed for muscle spasms. 12/10/22  Yes Wynonia Lawman A, NP  Multiple Vitamins-Minerals (VISION VITAMINS) TABS Take 1 tablet by mouth daily.   Yes [provider]  olmesartan (BENICAR) 20 MG tablet Take 1 tablet (20 mg total) by mouth daily. 01/01/22  Yes Shade Flood, MD  fluticasone (FLONASE) 50 MCG/ACT nasal spray Place 2 sprays into both nostrils daily. Patient not taking: Reported on 01/10/2023 01/01/22   Shade Flood, MD  methylPREDNISolone (MEDROL DOSEPAK) 4 MG TBPK tablet Take as directed Patient not taking: Reported on 01/10/2023 05/29/22   Rodriguez-Southworth, Nettie Elm, PA-C  montelukast (SINGULAIR) 10 MG tablet Take 1 tablet (10 mg total) by mouth at bedtime. Patient not taking: Reported on 01/10/2023 08/22/21   Janeece Agee, NP  nitrofurantoin, macrocrystal-monohydrate, (MACROBID) 100 MG capsule Take 1 capsule (100 mg total) by mouth 2 (two) times daily. Patient not taking: Reported on 01/10/2023 12/10/22   Letta Kocher, NP   Social History   Socioeconomic History   Marital status: Married    Spouse name: Not on file   Number of children: Not on file   Years of education: Not on file    Highest education level: Not on file  Occupational History   Not on file  Tobacco Use   Smoking status: Never   Smokeless tobacco: Never  Vaping Use   Vaping status: Never Used  Substance and Sexual Activity   Alcohol use: Yes    Comment: occasionally   Drug use: No   Sexual activity: Not Currently  Other Topics Concern   Not on file  Social History Narrative   Not on file   Social Determinants of Health   Financial Resource Strain: Low Risk  (01/03/2023)   Overall Financial Resource Strain (CARDIA)    Difficulty of Paying Living Expenses: Not hard at all  Food Insecurity: No Food Insecurity (01/03/2023)   Hunger Vital Sign    Worried About Running Out of Food in the Last Year: Never true    Ran Out of Food in the Last Year: Never true  Transportation Needs: No Transportation Needs (01/03/2023)   PRAPARE - Administrator, Civil Service (Medical): No    Lack of Transportation (Non-Medical): No  Physical Activity: Sufficiently Active (01/03/2023)   Exercise Vital Sign    Days of Exercise per Week: 4 days    Minutes of Exercise per Session: 50 min  Stress: No Stress Concern Present (01/03/2023)   [employer] of Occupational Health - Occupational Stress Questionnaire    Feeling of Stress : Not at all  Social Connections: Moderately Integrated (01/03/2023)   Social Connection and Isolation Panel [NHANES]    Frequency of Communication with Friends and Family: More than three times a week    Frequency of Social Gatherings with Friends and Family: More than three times a week    Attends Religious Services: Never    Database administrator or Organizations: Yes    Attends Engineer, structural: More than 4 times per year    Marital Status: Married  Catering manager Violence: Not At Risk (01/03/2023)   Humiliation, Afraid, Rape, and Kick questionnaire    Fear of Current or Ex-Partner: No    Emotionally Abused: No    Physically Abused: No    Sexually Abused:  No    Review of  Systems Per HPI, 13 point review of systems per patient health survey noted.  Negative other than as indicated above or in HPI.    Objective:   Vitals:   01/10/23 1302  BP: 126/74  Pulse: 79  Temp: 98.4 F (36.9 C)  TempSrc: Temporal  SpO2: 98%  Weight: 148 lb 6.4 oz (67.3 kg)  Height: 5' (1.524 m)     Physical Exam Vitals reviewed.  Constitutional:      General: She is not in acute distress.    Appearance: She is well-developed.  HENT:     Head: Normocephalic and atraumatic.     Right Ear: Hearing, tympanic membrane, ear canal and external ear normal.     Left Ear: Hearing, tympanic membrane, ear canal and external ear normal.     Nose: Nose normal.     Mouth/Throat:     Pharynx: No posterior oropharyngeal erythema.  Eyes:     Conjunctiva/sclera: Conjunctivae normal.     Pupils: Pupils are equal, round, and reactive to light.  Neck:     Thyroid: No thyromegaly.  Cardiovascular:     Rate and Rhythm: Normal rate and regular rhythm.     Heart sounds: Normal heart sounds. No murmur heard. Pulmonary:     Effort: Pulmonary effort is normal. No respiratory distress.     Breath sounds: Normal breath sounds. No wheezing or rhonchi.  Abdominal:     General: Bowel sounds are normal.     Palpations: Abdomen is soft.     Tenderness: There is no abdominal tenderness.  Musculoskeletal:        General: No tenderness. Normal range of motion.     Cervical back: Normal range of motion and neck supple.  Lymphadenopathy:     Cervical: No cervical adenopathy.  Skin:    General: Skin is warm and dry.  Neurological:     Mental Status: She is alert and oriented to person, place, and time.  Psychiatric:        Mood and Affect: Mood normal.        Behavior: Behavior normal.        Thought Content: Thought content normal.    Results for orders placed or performed in visit on 01/10/23  POC COVID-19 BinaxNow  Result Value Ref Range   SARS Coronavirus 2 Ag  Negative Negative    Assessment & Plan:  Jessica Stephens is a 72 y.o. female . Annual physical exam  - -anticipatory guidance as below in AVS, screening labs above. Health maintenance items as above in HPI discussed/recommended as applicable.   Allergic rhinitis, unspecified seasonality, unspecified trigger - Plan: fluticasone (FLONASE) 50 MCG/ACT nasal spray Nasal congestion - Plan: fluticasone (FLONASE) 50 MCG/ACT nasal spray, POC COVID-19 BinaxNow  -Flare of allergies versus viral illness.  Reassuring exam, negative COVID testing as above.  Continue symptomatic care with Flonase, RTC precautions.  Essential hypertension - Plan: olmesartan (BENICAR) 20 MG tablet, amLODipine (NORVASC) 10 MG tablet, CBC  -Borderline control previously, okay in office.  Home monitoring recommended but no med changes for now.  Labs ordered.  RTC precautions if elevated readings.  Hyperlipidemia, unspecified hyperlipidemia type - Plan: atorvastatin (LIPITOR) 20 MG tablet, Comprehensive metabolic panel, Lipid panel  -Labs ordered, tolerating current med regimen.  Continue same.  Meds ordered this encounter  Medications   fluticasone (FLONASE) 50 MCG/ACT nasal spray    Sig: Place 2 sprays into both nostrils daily.    Dispense:  18.2 mL    Refill:  2   olmesartan (BENICAR) 20 MG tablet    Sig: Take 1 tablet (20 mg total) by mouth daily.    Dispense:  90 tablet    Refill:  3   amLODipine (NORVASC) 10 MG tablet    Sig: Take 1 tablet (10 mg total) by mouth daily.    Dispense:  30 tablet    Refill:  0   atorvastatin (LIPITOR) 20 MG tablet    Sig: Take 1 tablet (20 mg total) by mouth daily.    Dispense:  30 tablet    Refill:  0    Patient needs to make an appointment to continue   Patient Instructions  If congestion is from allergies, flonase may help. Let me know if not improving in next 1 week.   Blood pressure ok here today. Has been higher recently. Keep a record of your blood pressures outside of  the office and follow up with me if readings over 140/90.   No medication changes at this time.  If any concerns on labs I will let you know.  Take care!  Preventive Care 60 Years and Older, Female Preventive care refers to lifestyle choices and visits with your health care provider that can promote health and wellness. Preventive care visits are also called wellness exams. What can I expect for my preventive care visit? Counseling Your health care provider may ask you questions about your: Medical history, including: Past medical problems. Family medical history. Pregnancy and menstrual history. History of falls. Current health, including: Memory and ability to understand (cognition). Emotional well-being. Home life and relationship well-being. Sexual activity and sexual health. Lifestyle, including: Alcohol, nicotine or tobacco, and drug use. Access to firearms. Diet, exercise, and sleep habits. Work and work Astronomer. Sunscreen use. Safety issues such as seatbelt and bike helmet use. Physical exam Your health care provider will check your: Height and weight. These may be used to calculate your BMI (body mass index). BMI is a measurement that tells if you are at a healthy weight. Waist circumference. This measures the distance around your waistline. This measurement also tells if you are at a healthy weight and may help predict your risk of certain diseases, such as type 2 diabetes and high blood pressure. Heart rate and blood pressure. Body temperature. Skin for abnormal spots. What immunizations do I need?  Vaccines are usually given at various ages, according to a schedule. Your health care provider will recommend vaccines for you based on your age, medical history, and lifestyle or other factors, such as travel or where you work. What tests do I need? Screening Your health care provider may recommend screening tests for certain conditions. This may include: Lipid and  cholesterol levels. Hepatitis C test. Hepatitis B test. HIV (human immunodeficiency virus) test. STI (sexually transmitted infection) testing, if you are at risk. Lung cancer screening. Colorectal cancer screening. Diabetes screening. This is done by checking your blood sugar (glucose) after you have not eaten for a while (fasting). Mammogram. Talk with your health care provider about how often you should have regular mammograms. BRCA-related cancer screening. This may be done if you have a family history of breast, ovarian, tubal, or peritoneal cancers. Bone density scan. This is done to screen for osteoporosis. Talk with your health care provider about your test results, treatment options, and if necessary, the need for more tests. Follow these instructions at home: Eating and drinking  Eat a diet that includes fresh fruits and vegetables, whole grains, lean protein,  and low-fat dairy products. Limit your intake of foods with high amounts of sugar, saturated fats, and salt. Take vitamin and mineral supplements as recommended by your health care provider. Do not drink alcohol if your health care provider tells you not to drink. If you drink alcohol: Limit how much you have to 0-1 drink a day. Know how much alcohol is in your drink. In the [place] one drink equals one 12 oz bottle of beer (355 mL), one 5 oz glass of wine (148 mL), or one 1 oz glass of hard liquor (44 mL). Lifestyle Brush your teeth every morning and night with fluoride toothpaste. Floss one time each day. Exercise for at least 30 minutes 5 or more days each week. Do not use any products that contain nicotine or tobacco. These products include cigarettes, chewing tobacco, and vaping devices, such as e-cigarettes. If you need help quitting, ask your health care provider. Do not use drugs. If you are sexually active, practice safe sex. Use a condom or other form of protection in order to prevent STIs. Take aspirin only as told  by your health care provider. Make sure that you understand how much to take and what form to take. Work with your health care provider to find out whether it is safe and beneficial for you to take aspirin daily. Ask your health care provider if you need to take a cholesterol-lowering medicine (statin). Find healthy ways to manage stress, such as: Meditation, yoga, or listening to music. Journaling. Talking to a trusted person. Spending time with friends and family. Minimize exposure to UV radiation to reduce your risk of skin cancer. Safety Always wear your seat belt while driving or riding in a vehicle. Do not drive: If you have been drinking alcohol. Do not ride with someone who has been drinking. When you are tired or distracted. While texting. If you have been using any mind-altering substances or drugs. Wear a helmet and other protective equipment during sports activities. If you have firearms in your house, make sure you follow all gun safety procedures. What's next? Visit your health care provider once a year for an annual wellness visit. Ask your health care provider how often you should have your eyes and teeth checked. Stay up to date on all vaccines. This information is not intended to replace advice given to you by your health care provider. Make sure you discuss any questions you have with your health care provider. Document Revised: 09/14/2020 Document Reviewed: 09/14/2020 Elsevier Patient Education  2024 [employer].            Signed,   Meredith Staggers, MD [facility] 01/10/23 2:15 PM

## 2023-01-10 NOTE — Patient Instructions (Addendum)
If congestion is from allergies, flonase may help. Let me know if not improving in next 1 week.   Blood pressure ok here today. Has been higher recently. Keep a record of your blood pressures outside of the office and follow up with me if readings over 140/90.   No medication changes at this time.  If any concerns on labs I will let you know.  Take care!  Preventive Care 52 Years and Older, Female Preventive care refers to lifestyle choices and visits with your health care provider that can promote health and wellness. Preventive care visits are also called wellness exams. What can I expect for my preventive care visit? Counseling Your health care provider may ask you questions about your: Medical history, including: Past medical problems. Family medical history. Pregnancy and menstrual history. History of falls. Current health, including: Memory and ability to understand (cognition). Emotional well-being. Home life and relationship well-being. Sexual activity and sexual health. Lifestyle, including: Alcohol, nicotine or tobacco, and drug use. Access to firearms. Diet, exercise, and sleep habits. Work and work Astronomer. Sunscreen use. Safety issues such as seatbelt and bike helmet use. Physical exam Your health care provider will check your: Height and weight. These may be used to calculate your BMI (body mass index). BMI is a measurement that tells if you are at a healthy weight. Waist circumference. This measures the distance around your waistline. This measurement also tells if you are at a healthy weight and may help predict your risk of certain diseases, such as type 2 diabetes and high blood pressure. Heart rate and blood pressure. Body temperature. Skin for abnormal spots. What immunizations do I need?  Vaccines are usually given at various ages, according to a schedule. Your health care provider will recommend vaccines for you based on your age, medical history, and  lifestyle or other factors, such as travel or where you work. What tests do I need? Screening Your health care provider may recommend screening tests for certain conditions. This may include: Lipid and cholesterol levels. Hepatitis C test. Hepatitis B test. HIV (human immunodeficiency virus) test. STI (sexually transmitted infection) testing, if you are at risk. Lung cancer screening. Colorectal cancer screening. Diabetes screening. This is done by checking your blood sugar (glucose) after you have not eaten for a while (fasting). Mammogram. Talk with your health care provider about how often you should have regular mammograms. BRCA-related cancer screening. This may be done if you have a family history of breast, ovarian, tubal, or peritoneal cancers. Bone density scan. This is done to screen for osteoporosis. Talk with your health care provider about your test results, treatment options, and if necessary, the need for more tests. Follow these instructions at home: Eating and drinking  Eat a diet that includes fresh fruits and vegetables, whole grains, lean protein, and low-fat dairy products. Limit your intake of foods with high amounts of sugar, saturated fats, and salt. Take vitamin and mineral supplements as recommended by your health care provider. Do not drink alcohol if your health care provider tells you not to drink. If you drink alcohol: Limit how much you have to 0-1 drink a day. Know how much alcohol is in your drink. In the [place] one drink equals one 12 oz bottle of beer (355 mL), one 5 oz glass of wine (148 mL), or one 1 oz glass of hard liquor (44 mL). Lifestyle Brush your teeth every morning and night with fluoride toothpaste. Floss one time each day. Exercise for at  least 30 minutes 5 or more days each week. Do not use any products that contain nicotine or tobacco. These products include cigarettes, chewing tobacco, and vaping devices, such as e-cigarettes. If you need  help quitting, ask your health care provider. Do not use drugs. If you are sexually active, practice safe sex. Use a condom or other form of protection in order to prevent STIs. Take aspirin only as told by your health care provider. Make sure that you understand how much to take and what form to take. Work with your health care provider to find out whether it is safe and beneficial for you to take aspirin daily. Ask your health care provider if you need to take a cholesterol-lowering medicine (statin). Find healthy ways to manage stress, such as: Meditation, yoga, or listening to music. Journaling. Talking to a trusted person. Spending time with friends and family. Minimize exposure to UV radiation to reduce your risk of skin cancer. Safety Always wear your seat belt while driving or riding in a vehicle. Do not drive: If you have been drinking alcohol. Do not ride with someone who has been drinking. When you are tired or distracted. While texting. If you have been using any mind-altering substances or drugs. Wear a helmet and other protective equipment during sports activities. If you have firearms in your house, make sure you follow all gun safety procedures. What's next? Visit your health care provider once a year for an annual wellness visit. Ask your health care provider how often you should have your eyes and teeth checked. Stay up to date on all vaccines. This information is not intended to replace advice given to you by your health care provider. Make sure you discuss any questions you have with your health care provider. Document Revised: 09/14/2020 Document Reviewed: 09/14/2020 Elsevier Patient Education  2024 [employer].

## 2023-01-15 ENCOUNTER — Other Ambulatory Visit: Payer: Self-pay | Admitting: Gastroenterology

## 2023-01-15 DIAGNOSIS — R7401 Elevation of levels of liver transaminase levels: Secondary | ICD-10-CM

## 2023-01-18 ENCOUNTER — Ambulatory Visit (HOSPITAL_COMMUNITY)
Admission: EM | Admit: 2023-01-18 | Discharge: 2023-01-18 | Disposition: A | Discharge status: Home / Self Care | Payer: PPO | Attending: Family Medicine | Admitting: Family Medicine

## 2023-01-18 DIAGNOSIS — J209 Acute bronchitis, unspecified: Secondary | ICD-10-CM

## 2023-01-18 DIAGNOSIS — J0140 Acute pansinusitis, unspecified: Secondary | ICD-10-CM

## 2023-01-18 MED ORDER — PREDNISONE 20 MG PO TABS: 20.0000 mg | ORAL_TABLET | Freq: Every day | ORAL | 0 refills | Status: AC

## 2023-01-18 MED ORDER — AMOXICILLIN-POT CLAVULANATE 875-125 MG PO TABS: 1.0000 | ORAL_TABLET | Freq: Two times a day (BID) | ORAL | 0 refills | Status: DC

## 2023-01-18 NOTE — Discharge Instructions (Addendum)
Resume Mucinex to help with cough symptoms.  Take antibiotics and prednisone as prescribed and complete entire course of medication.  Continue to hydrate well with fluids.  If symptoms worsen or do not improve return for evaluation.

## 2023-01-18 NOTE — ED Provider Notes (Signed)
[facility]    CSN: [id number] Arrival date & time: 01/18/23  2130      History   Chief Complaint Chief Complaint  Patient presents with   Cough    HPI PEARSON Jessica Stephens is a 72 y.o. female.   Patient presents today with a 2-week history of URI symptoms including nasal congestion, drainage headache, sinus pressure, scratchy throat and now she has cough.  Patient reports over a week ago at her primary care doctor's office she was tested for COVID which was negative.  She reports symptoms have worsened.  She works in Engineering geologist and is uncertain if she had any close sick contacts that may have attributed to her current illness symptoms.  She is taking multiple over-the-counter medications without relief.  She has a history of recurrent sinusitis, anxiety, hypertension and GERD.  She denies any fever.  Past Medical History:  Diagnosis Date   Abnormal transaminases    Anxiety    Arthritis    Esophageal stricture    GERD (gastroesophageal reflux disease)    Hiatal hernia    Hyperlipidemia    Hypertension    Maxillary sinusitis    OAB (overactive bladder)    Proctitis    Ulcerative colitis (HCC)    left sided   Vitamin B12 deficiency     Patient Active Problem List   Diagnosis Date Noted   Ulcerative colitis (HCC) 11/04/2020   History of colonic polyps 11/04/2020   Age-related osteoporosis without current pathological fracture 03/11/2020   Pelvic prolapse 03/08/2020   Lower urinary tract symptoms (LUTS) 12/05/2016   Acute UTI 12/05/2016   Flank pain, acute 12/05/2016   Prolonged Q-T interval on ECG 08/21/2014   Lobar pneumonia (HCC) 08/21/2014   CAP (community acquired pneumonia) 08/19/2014   Community acquired pneumonia 08/19/2014   Sepsis (HCC) 08/19/2014   Hypokalemia 08/19/2014   HYPERLIPIDEMIA 05/29/2010   DYSPNEA 05/29/2010   CHEST PAIN, ATYPICAL 05/02/2010   ESOPHAGEAL STRICTURE 04/07/2010   Diaphragmatic hernia 04/07/2010   ULCERATIVE PROCTITIS  04/07/2010   Arthropathy 04/07/2010   LUQ PAIN 04/07/2010   TRANSAMINASES, SERUM, ELEVATED 04/07/2010   VITAMIN B12 DEFICIENCY 01/30/2008   ANXIETY, CHRONIC 01/29/2008   Essential hypertension 01/29/2008   CHRONIC MAXILLARY SINUSITIS 04/24/2007   G E R D 04/24/2007   UNSPECIFIED OSTEOPOROSIS 04/24/2007   ULCERATIVE COLITIS, LEFT SIDED 02/25/2007    Past Surgical History:  Procedure Laterality Date   CATARACT EXTRACTION     CESAREAN SECTION     nose surg     NOSE SURGERY      OB History   No obstetric history on file.      Home Medications    Prior to Admission medications   Medication Sig Start Date End Date Taking? Authorizing Provider  albuterol (VENTOLIN HFA) 108 (90 Base) MCG/ACT inhaler Inhale 2 puffs into the lungs every 4 (four) hours as needed for wheezing or shortness of breath. 03/23/22  Yes Zenia Resides, MD  amLODipine (NORVASC) 10 MG tablet Take 1 tablet (10 mg total) by mouth daily. 01/10/23  Yes Shade Flood, MD  amoxicillin-clavulanate (AUGMENTIN) 875-125 MG tablet Take 1 tablet by mouth every 12 (twelve) hours. 01/18/23  Yes Bing Neighbors, NP  atorvastatin (LIPITOR) 20 MG tablet Take 1 tablet (20 mg total) by mouth daily. 01/10/23  Yes Shade Flood, MD  cetirizine (ZYRTEC ALLERGY) 10 MG tablet Take 1 tablet (10 mg total) by mouth daily. 12/23/20  Yes Janeece Agee, NP  fluticasone (  FLONASE) 50 MCG/ACT nasal spray Place 2 sprays into both nostrils daily. 01/10/23  Yes Shade Flood, MD  mesalamine (LIALDA) 1.2 g EC tablet Take 1-2 tablets (1.2-2.4 g total) by mouth 2 (two) times daily. 12/23/20  Yes Janeece Agee, NP  Multiple Vitamins-Minerals (VISION VITAMINS) TABS Take 1 tablet by mouth daily.   Yes [provider]  olmesartan (BENICAR) 20 MG tablet Take 1 tablet (20 mg total) by mouth daily. 01/10/23  Yes Shade Flood, MD  predniSONE (DELTASONE) 20 MG tablet Take 1 tablet (20 mg total) by mouth daily with breakfast  for 5 days. 01/18/23 01/23/23 Yes Bing Neighbors, NP  methocarbamol (ROBAXIN) 500 MG tablet Take 1 tablet (500 mg total) by mouth every 12 (twelve) hours as needed for muscle spasms. 12/10/22   Letta Kocher, NP    Family History Family History  Problem Relation Age of Onset   Arthritis Mother    Dementia Mother    Diabetes Father    Hyperlipidemia Father    Hypertension Father     Social History Social History   Tobacco Use   Smoking status: Never   Smokeless tobacco: Never  Vaping Use   Vaping status: Never Used  Substance Use Topics   Alcohol use: Yes    Comment: occasionally   Drug use: No     Allergies   Alendronate sodium, Other, and Sulfonamide derivatives   Review of Systems Review of Systems  Respiratory:  Positive for cough.      Physical Exam Triage Vital Signs ED Triage Vitals  Encounter Vitals Group     BP 01/18/23 0949 (!) 177/80     Systolic BP Percentile --      Diastolic BP Percentile --      Pulse Rate 01/18/23 0949 98     Resp 01/18/23 0949 16     Temp 01/18/23 0949 98.1 F (36.7 C)     Temp Source 01/18/23 0949 Oral     SpO2 01/18/23 0949 97 %     Weight --      Height --      Head Circumference --      Peak Flow --      Pain Score 01/18/23 0952 0     Pain Loc --      Pain Education --      Exclude from Growth Chart --    No data found.  Updated Vital Signs BP (!) 177/80 (BP Location: Left Arm)   Pulse 98   Temp 98.1 F (36.7 C) (Oral)   Resp 16   SpO2 97%   Visual Acuity Right Eye Distance:   Left Eye Distance:   Bilateral Distance:    Right Eye Near:   Left Eye Near:    Bilateral Near:     Physical Exam Vitals reviewed.  Constitutional:      Appearance: Normal appearance.  HENT:     Head: Normocephalic and atraumatic.  Eyes:     Conjunctiva/sclera: Conjunctivae normal.     Pupils: Pupils are equal, round, and reactive to light.  Cardiovascular:     Rate and Rhythm: Normal rate and regular rhythm.   Pulmonary:     Effort: Pulmonary effort is normal.     Breath sounds: Normal breath sounds.  Musculoskeletal:     Cervical back: Normal range of motion.  Skin:    General: Skin is warm and dry.  Neurological:     General: No focal deficit present.  Mental Status: She is alert.      UC Treatments / Results  Labs (all labs ordered are listed, but only abnormal results are displayed) Labs Reviewed - No data to display  EKG   Radiology No results found.  Procedures Procedures (including critical care time)  Medications Ordered in UC Medications - No data to display  Initial Impression / Assessment and Plan / UC Course  I have reviewed the triage vital signs and the nursing notes.  Pertinent labs & imaging results that were available during my care of the patient were reviewed by me and considered in my medical decision making (see chart for details).    Acute noncomplicated pansinusitis,empiric treatment with Augmentin twice daily for 7 days. Acute bronchitis treatment with prednisone 20 mg to reduce inflammation in lungs and chest.  Patient has no audible wheezing or rhonchi therefore bronchitis is likely evolving therefore treating with a lower dose of prednisone given history of anxiety.  Patient encouraged to continue Mucinex as she was previously prescribed by her PCP.  Continue to hydrate well with fluids.  Return precautions given if symptoms worsen or do not improve. Final Clinical Impressions(s) / UC Diagnoses   Final diagnoses:  Acute pansinusitis, recurrence not specified  Acute bronchitis, unspecified organism     Discharge Instructions      Resume Mucinex to help with cough symptoms.  Take antibiotics and prednisone as prescribed and complete entire course of medication.  Continue to hydrate well with fluids.  If symptoms worsen or do not improve return for evaluation.     ED Prescriptions     Medication Sig Dispense Auth. Provider    amoxicillin-clavulanate (AUGMENTIN) 875-125 MG tablet Take 1 tablet by mouth every 12 (twelve) hours. 14 tablet Bing Neighbors, NP   predniSONE (DELTASONE) 20 MG tablet Take 1 tablet (20 mg total) by mouth daily with breakfast for 5 days. 5 tablet Bing Neighbors, NP      PDMP not reviewed this encounter.   Bing Neighbors, NP 01/18/23 1119

## 2023-01-18 NOTE — ED Triage Notes (Signed)
Pt c/o cough and congestion x 2 weeks. Pt is using OTC medication with no relief.

## 2023-01-31 ENCOUNTER — Encounter: Payer: PPO | Admitting: Family Medicine

## 2023-02-08 DIAGNOSIS — H353124 Nonexudative age-related macular degeneration, left eye, advanced atrophic with subfoveal involvement: Secondary | ICD-10-CM | POA: Diagnosis not present

## 2023-02-08 DIAGNOSIS — H35373 Puckering of macula, bilateral: Secondary | ICD-10-CM | POA: Diagnosis not present

## 2023-02-08 DIAGNOSIS — H353111 Nonexudative age-related macular degeneration, right eye, early dry stage: Secondary | ICD-10-CM | POA: Diagnosis not present

## 2023-02-27 ENCOUNTER — Other Ambulatory Visit: Payer: Self-pay | Admitting: Family Medicine

## 2023-02-27 DIAGNOSIS — I10 Essential (primary) hypertension: Secondary | ICD-10-CM

## 2023-03-05 ENCOUNTER — Other Ambulatory Visit: Payer: Self-pay | Admitting: Family Medicine

## 2023-03-05 DIAGNOSIS — E785 Hyperlipidemia, unspecified: Secondary | ICD-10-CM

## 2023-04-01 ENCOUNTER — Other Ambulatory Visit: Payer: Self-pay | Admitting: Family Medicine

## 2023-04-01 DIAGNOSIS — I10 Essential (primary) hypertension: Secondary | ICD-10-CM

## 2023-04-01 DIAGNOSIS — E785 Hyperlipidemia, unspecified: Secondary | ICD-10-CM

## 2023-04-05 DIAGNOSIS — H43812 Vitreous degeneration, left eye: Secondary | ICD-10-CM | POA: Diagnosis not present

## 2023-04-05 DIAGNOSIS — H353124 Nonexudative age-related macular degeneration, left eye, advanced atrophic with subfoveal involvement: Secondary | ICD-10-CM | POA: Diagnosis not present

## 2023-04-05 DIAGNOSIS — H353111 Nonexudative age-related macular degeneration, right eye, early dry stage: Secondary | ICD-10-CM | POA: Diagnosis not present

## 2023-04-05 DIAGNOSIS — H35373 Puckering of macula, bilateral: Secondary | ICD-10-CM | POA: Diagnosis not present

## 2023-04-14 ENCOUNTER — Other Ambulatory Visit: Payer: Self-pay | Admitting: Family Medicine

## 2023-04-14 DIAGNOSIS — R0981 Nasal congestion: Secondary | ICD-10-CM

## 2023-04-14 DIAGNOSIS — J309 Allergic rhinitis, unspecified: Secondary | ICD-10-CM

## 2023-04-29 ENCOUNTER — Other Ambulatory Visit: Payer: Self-pay | Admitting: Family Medicine

## 2023-04-29 DIAGNOSIS — E785 Hyperlipidemia, unspecified: Secondary | ICD-10-CM

## 2023-04-30 ENCOUNTER — Other Ambulatory Visit: Payer: Self-pay | Admitting: Family Medicine

## 2023-04-30 DIAGNOSIS — I10 Essential (primary) hypertension: Secondary | ICD-10-CM

## 2023-05-28 ENCOUNTER — Other Ambulatory Visit: Payer: Self-pay | Admitting: Family Medicine

## 2023-05-28 DIAGNOSIS — E785 Hyperlipidemia, unspecified: Secondary | ICD-10-CM

## 2023-05-28 DIAGNOSIS — I10 Essential (primary) hypertension: Secondary | ICD-10-CM

## 2023-06-27 ENCOUNTER — Other Ambulatory Visit: Payer: Self-pay | Admitting: Family Medicine

## 2023-06-27 DIAGNOSIS — Z1231 Encounter for screening mammogram for malignant neoplasm of breast: Secondary | ICD-10-CM

## 2023-06-27 DIAGNOSIS — I10 Essential (primary) hypertension: Secondary | ICD-10-CM

## 2023-07-04 DIAGNOSIS — L308 Other specified dermatitis: Secondary | ICD-10-CM | POA: Diagnosis not present

## 2023-07-11 ENCOUNTER — Ambulatory Visit
Admission: RE | Admit: 2023-07-11 | Discharge: 2023-07-11 | Disposition: A | Discharge status: Home / Self Care | Source: Ambulatory Visit | Attending: Family Medicine | Admitting: Family Medicine

## 2023-07-11 DIAGNOSIS — Z1231 Encounter for screening mammogram for malignant neoplasm of breast: Secondary | ICD-10-CM

## 2023-07-11 DIAGNOSIS — R399 Unspecified symptoms and signs involving the genitourinary system: Secondary | ICD-10-CM | POA: Diagnosis not present

## 2023-07-14 ENCOUNTER — Other Ambulatory Visit: Payer: Self-pay | Admitting: Family Medicine

## 2023-07-14 DIAGNOSIS — R0981 Nasal congestion: Secondary | ICD-10-CM

## 2023-07-14 DIAGNOSIS — J309 Allergic rhinitis, unspecified: Secondary | ICD-10-CM

## 2023-07-18 ENCOUNTER — Other Ambulatory Visit: Payer: Self-pay | Admitting: Family Medicine

## 2023-07-18 DIAGNOSIS — I10 Essential (primary) hypertension: Secondary | ICD-10-CM

## 2023-07-18 DIAGNOSIS — E785 Hyperlipidemia, unspecified: Secondary | ICD-10-CM

## 2023-07-26 DIAGNOSIS — H353111 Nonexudative age-related macular degeneration, right eye, early dry stage: Secondary | ICD-10-CM | POA: Diagnosis not present

## 2023-07-26 DIAGNOSIS — H35373 Puckering of macula, bilateral: Secondary | ICD-10-CM | POA: Diagnosis not present

## 2023-07-26 DIAGNOSIS — H353124 Nonexudative age-related macular degeneration, left eye, advanced atrophic with subfoveal involvement: Secondary | ICD-10-CM | POA: Diagnosis not present

## 2023-07-26 DIAGNOSIS — H43812 Vitreous degeneration, left eye: Secondary | ICD-10-CM | POA: Diagnosis not present

## 2023-08-05 ENCOUNTER — Ambulatory Visit: Payer: Self-pay

## 2023-08-05 NOTE — Telephone Encounter (Signed)
  Chief Complaint: Dizziness/lightheadedness Symptoms: dizziness, leg muscle cramps.  Frequency: couple of weeks Pertinent Negatives: Patient denies CP, SOB Disposition: [] ED /[] Urgent Care (no appt availability in office) / [x] Appointment(In office/virtual)/ []  [facility]/ [] Home Care/ [] Refused Recommended Disposition /[] [facility]/ []  Follow-up with PCP Additional Notes: patient called with concerns for dizziness/lightheadedness along with muscle cramps. Patient states both have been happening for awhile. Patient states the muscle cramps have been happening more regularly. Per protocol, patient is recommended to be seen in the next three days. Patient scheduled for an appointment with PCP on Aug 07, 2023 at 2:00 PM. Patient verbalized understanding of the plan and all questions answered.    Copied from CRM [id number]. Topic: Clinical - Red Word Triage >> Aug 05, 2023  3:12 PM Kita Perish H wrote: [facility] that prompted transfer to Nurse Triage: Dizziness increasing, fell 2 weeks ago backwards but son caught head but had pain in legs, getting Charlie horses on and off at night in legs feet and thighs getting worse. Reason for Disposition  [1] MILD dizziness (e.g., walking normally) AND [2] has NOT been evaluated by doctor (or NP/PA) for this  (Exception: Dizziness caused by heat exposure, sudden standing, or poor fluid intake.)  Answer Assessment - Initial Assessment Questions 1. DESCRIPTION: "Describe your dizziness."     Patient reports episodes of dizziness-last one was 2 weeks ago where she had a wave of dizziness and fell.  2. LIGHTHEADED: "Do you feel lightheaded?" (e.g., somewhat faint, woozy, weak upon standing)     yes 3. VERTIGO: "Do you feel like either you or the room is spinning or tilting?" (i.e. vertigo)     no 4. SEVERITY: "How bad is it?"  "Do you feel like you are going to faint?" "Can you stand and walk?"   - MILD: Feels slightly dizzy, but walking  normally.   - MODERATE: Feels unsteady when walking, but not falling; interferes with normal activities (e.g., school, work).   - SEVERE: Unable to walk without falling, or requires assistance to walk without falling; feels like passing out now.      Mild 5. ONSET:  "When did the dizziness begin?"     Has been happening on and off for a while 6. AGGRAVATING FACTORS: "Does anything make it worse?" (e.g., standing, change in head position)     From a bending to standing position 7. HEART RATE: "Can you tell me your heart rate?" "How many beats in 15 seconds?"  (Note: not all patients can do this)       N/A 8. CAUSE: "What do you think is causing the dizziness?"     unsure 9. RECURRENT SYMPTOM: "Have you had dizziness before?" If Yes, ask: "When was the last time?" "What happened that time?"     yes 10. OTHER SYMPTOMS: "Do you have any other symptoms?" (e.g., fever, chest pain, vomiting, diarrhea, bleeding)       Muscle cramps to bilateral legs,  Protocols used: Dizziness - Lightheadedness-A-AH

## 2023-08-07 ENCOUNTER — Ambulatory Visit (INDEPENDENT_AMBULATORY_CARE_PROVIDER_SITE_OTHER): Admitting: Family Medicine

## 2023-08-07 VITALS — BP 136/68 | HR 82 | Temp 98.0°F | Ht 60.0 in | Wt 148.2 lb

## 2023-08-07 DIAGNOSIS — R252 Cramp and spasm: Secondary | ICD-10-CM

## 2023-08-07 DIAGNOSIS — E785 Hyperlipidemia, unspecified: Secondary | ICD-10-CM | POA: Diagnosis not present

## 2023-08-07 DIAGNOSIS — R42 Dizziness and giddiness: Secondary | ICD-10-CM

## 2023-08-07 DIAGNOSIS — H9190 Unspecified hearing loss, unspecified ear: Secondary | ICD-10-CM

## 2023-08-07 DIAGNOSIS — M79661 Pain in right lower leg: Secondary | ICD-10-CM

## 2023-08-07 DIAGNOSIS — I10 Essential (primary) hypertension: Secondary | ICD-10-CM

## 2023-08-07 LAB — POCT URINALYSIS DIPSTICK
Bilirubin, UA: NEGATIVE
Blood, UA: NEGATIVE
Glucose, UA: NEGATIVE
Ketones, UA: NEGATIVE
Leukocytes, UA: NEGATIVE
Nitrite, UA: NEGATIVE
Protein, UA: NEGATIVE
Spec Grav, UA: 1.015 (ref 1.010–1.025)
Urobilinogen, UA: 0.2 U/dL
pH, UA: 7 (ref 5.0–8.0)

## 2023-08-07 NOTE — Patient Instructions (Addendum)
 Track your amount of water you consume in ounces per day and let me know.  I will check some labs for your muscle cramps, but stop the cholesterol medicine temporarily. If cramps are improving off that med, we may change meds or how often you take it.  Return to the clinic or go to the nearest emergency room if any of your symptoms worsen or new symptoms occur.  I will check some labs for lightheadedness, and I have placed an x-ray order for your leg to be performed at the [facility] location.  If any concerns on x-ray or labs I will let you know, but lets follow-up in 2 weeks and see how things are going.  Additionally I referred you back to ear nose and throat to discuss the difficulty with hearing.  Hang in there.   Return to the clinic or go to the nearest emergency room if any of your symptoms worsen or new symptoms occur.  [facility] or xray: Walk in 8:30-4:30 during weekdays, no appointment needed [address]  [place], [place] [address]    Fall Prevention in the Home, Adult Falls can cause injuries and affect people of all ages. There are many simple things that you can do to make your home safe and to help prevent falls. If you need it, ask for help making these changes. What actions can I take to prevent falls? General information Use good lighting in all rooms. Make sure to: Replace any light bulbs that burn out. Turn on lights if it is dark and use night-lights. Keep items that you use often in easy-to-reach places. Lower the shelves around your home if needed. Move furniture so that there are clear paths around it. Do not keep throw rugs or other things on the floor that can make you trip. If any of your floors are uneven, fix them. Add color or contrast paint or tape to clearly mark and help you see: Grab bars or handrails. First and last steps of staircases. Where the edge of each step is. If you use a ladder or stepladder: Make sure that it is fully opened. Do  not climb a closed ladder. Make sure the sides of the ladder are locked in place. Have someone hold the ladder while you use it. Know where your pets are as you move through your home. What can I do in the bathroom?     Keep the floor dry. Clean up any water that is on the floor right away. Remove soap buildup in the bathtub or shower. Buildup makes bathtubs and showers slippery. Use non-skid mats or decals on the floor of the bathtub or shower. Attach bath mats securely with double-sided, non-slip rug tape. If you need to sit down while you are in the shower, use a non-slip stool. Install grab bars by the toilet and in the bathtub and shower. Do not use towel bars as grab bars. What can I do in the bedroom? Make sure that you have a light by your bed that is easy to reach. Do not use any sheets or blankets on your bed that hang to the floor. Have a firm bench or chair with side arms that you can use for support when you get dressed. What can I do in the kitchen? Clean up any spills right away. If you need to reach something above you, use a sturdy step stool that has a grab bar. Keep electrical cables out of the way. Do not  use floor polish or wax that makes floors slippery. What can I do with my stairs? Do not leave anything on the stairs. Make sure that you have a light switch at the top and the bottom of the stairs. Have them installed if you do not have them. Make sure that there are handrails on both sides of the stairs. Fix handrails that are broken or loose. Make sure that handrails are as long as the staircases. Install non-slip stair treads on all stairs in your home if they do not have carpet. Avoid having throw rugs at the top or bottom of stairs, or secure the rugs with carpet tape to prevent them from moving. Choose a carpet design that does not hide the edge of steps on the stairs. Make sure that carpet is firmly attached to the stairs. Fix any carpet that is loose or  worn. What can I do on the outside of my home? Use bright outdoor lighting. Repair the edges of walkways and driveways and fix any cracks. Clear paths of anything that can make you trip, such as tools or rocks. Add color or contrast paint or tape to clearly mark and help you see high doorway thresholds. Trim any bushes or trees on the main path into your home. Check that handrails are securely fastened and in good repair. Both sides of all steps should have handrails. Install guardrails along the edges of any raised decks or porches. Have leaves, snow, and ice cleared regularly. Use sand, salt, or ice melt on walkways during winter months if you live where there is ice and snow. In the garage, clean up any spills right away, including grease or oil spills. What other actions can I take? Review your medicines with your health care provider. Some medicines can make you confused or feel dizzy. This can increase your chance of falling. Wear closed-toe shoes that fit well and support your feet. Wear shoes that have rubber soles and low heels. Use a cane, walker, scooter, or crutches that help you move around if needed. Talk with your provider about other ways that you can decrease your risk of falls. This may include seeing a physical therapist to learn to do exercises to improve movement and strength. Where to find more information Centers for Disease Control and Prevention, [employer]: TonerPromos.no [employer] on Aging: BaseRingTones.pl National Institute on Aging: BaseRingTones.pl Contact a health care provider if: You are afraid of falling at home. You feel weak, drowsy, or dizzy at home. You fall at home. Get help right away if you: Lose consciousness or have trouble moving after a fall. Have a fall that causes a head injury. These symptoms may be an emergency. Get help right away. Call 911. Do not wait to see if the symptoms will go away. Do not drive yourself to the hospital. This information is  not intended to replace advice given to you by your health care provider. Make sure you discuss any questions you have with your health care provider. Document Revised: 11/20/2021 Document Reviewed: 11/20/2021 Elsevier Patient Education  2024 [employer].

## 2023-08-07 NOTE — Progress Notes (Signed)
 Subjective:  Patient ID: Jessica Stephens, female    DOB: 18-Apr-1950  Age: 73 y.o. MRN: [id number]  CC:  Chief Complaint  Patient presents with   Leg Problem    Pt notes bilateral leg cramping notes intense charlie horse feeling, tried walking off but as soon as she rests again she notes it immediatly returns, has been having these on and off for several months but last month or so has been a lot more frequent. Notes she drinks water but unsure if she should intake more    Dizziness    Notes continued dizziness since her Physical and noted very intermittent no known trigger     HPI [name] presents for  Concerns as above.   Bilateral leg pain/muscle cramps Off-and-on past few months but more frequent past month.  Has been trying to stay hydrated as above - does not track amounts.  Noted at night, not during the day, wakes up from sleep at times. Notes more on days that she has worked No new supplements other than otc potassium for past week - once per day.  No change in cramps.  She is on Lipitor 20 mg daily, without recent labs.  Labs were ordered at her physical in October, not done.  Lab Results  Component Value Date   CHOL 142 01/01/2022   HDL 56.40 01/01/2022   LDLCALC 66 01/01/2022   TRIG 97.0 01/01/2022   CHOLHDL 3 01/01/2022   Lab Results  Component Value Date   NA 140 01/01/2022   K 3.9 01/01/2022   CL 104 01/01/2022   CO2 29 01/01/2022    Dizziness: Intermittently, reports symptoms have been present since October at her physical.  As above, have labs ordered but not completed.  She is on amlodipine  10 mg daily olmesartan  20 mg daily for hypertension.  Elevated glucose of 108 in 2021 but normal in 2023. Has taken Flonase  for allergies, and zyrtec  daily.  No syncope/near syncope.  Heart races sometimes when she has to move quickly or in a hurry, not when feeling dizzy. No chest pains, no new headaches.   Fall few weeks ago - sore in R lower leg at times  since that fall.    Some difficulty with hearing - asked that I speak up today.  Has had ears cleaned in past, has seen ENT in past - few years ago- no recent hearing test.   History Patient Active Problem List   Diagnosis Date Noted   Sinus headache 07/25/2021   Ulcerative colitis (HCC) 11/04/2020   History of colonic polyps 11/04/2020   Age-related osteoporosis without current pathological fracture 03/11/2020   Pelvic prolapse 03/08/2020   Bilateral impacted cerumen 08/04/2019   Dermatitis of ear canal, bilateral 08/04/2019   Left ear hearing loss 08/04/2019   Nasal congestion 08/04/2019   Lower urinary tract symptoms (LUTS) 12/05/2016   Acute UTI 12/05/2016   Flank pain, acute 12/05/2016   Prolonged Q-T interval on ECG 08/21/2014   Lobar pneumonia (HCC) 08/21/2014   CAP (community acquired pneumonia) 08/19/2014   Community acquired pneumonia 08/19/2014   Sepsis (HCC) 08/19/2014   Hypokalemia 08/19/2014   HYPERLIPIDEMIA 05/29/2010   DYSPNEA 05/29/2010   CHEST PAIN, ATYPICAL 05/02/2010   ESOPHAGEAL STRICTURE 04/07/2010   Diaphragmatic hernia 04/07/2010   ULCERATIVE PROCTITIS 04/07/2010   Arthropathy 04/07/2010   LUQ PAIN 04/07/2010   TRANSAMINASES, SERUM, ELEVATED 04/07/2010   VITAMIN B12 DEFICIENCY 01/30/2008   ANXIETY, CHRONIC 01/29/2008   Essential hypertension  01/29/2008   CHRONIC MAXILLARY SINUSITIS 04/24/2007   G E R D 04/24/2007   UNSPECIFIED OSTEOPOROSIS 04/24/2007   ULCERATIVE COLITIS, LEFT SIDED 02/25/2007   Past Medical History:  Diagnosis Date   Abnormal transaminases    Anxiety    Arthritis    Esophageal stricture    GERD (gastroesophageal reflux disease)    Hiatal hernia    Hyperlipidemia    Hypertension    Maxillary sinusitis    OAB (overactive bladder)    Proctitis    Ulcerative colitis (HCC)    left sided   Vitamin B12 deficiency    Past Surgical History:  Procedure Laterality Date   CATARACT EXTRACTION     CESAREAN SECTION     nose  surg     NOSE SURGERY     Allergies  Allergen Reactions   Alendronate Sodium    Other Other (See Comments)    "messes up blood"    Sulfonamide Derivatives    Prior to Admission medications   Medication Sig Start Date End Date Taking? Authorizing Provider  albuterol  (VENTOLIN  HFA) 108 (90 Base) MCG/ACT inhaler Inhale 2 puffs into the lungs every 4 (four) hours as needed for wheezing or shortness of breath. 03/23/22  Yes Ann Keto, MD  amLODipine  (NORVASC ) 10 MG tablet TAKE 1 TABLET BY MOUTH EVERY DAY 07/18/23  Yes Benjiman Bras, MD  atorvastatin  (LIPITOR) 20 MG tablet TAKE 1 TABLET BY MOUTH EVERY DAY 07/22/23  Yes Benjiman Bras, MD  cetirizine  (ZYRTEC  ALLERGY) 10 MG tablet Take 1 tablet (10 mg total) by mouth daily. 12/23/20  Yes Ulyess Gammons, NP  fluticasone  (FLONASE ) 50 MCG/ACT nasal spray SPRAY 2 SPRAYS INTO EACH NOSTRIL EVERY DAY 07/16/23  Yes Benjiman Bras, MD  mesalamine  (LIALDA ) 1.2 g EC tablet Take 1-2 tablets (1.2-2.4 g total) by mouth 2 (two) times daily. 12/23/20  Yes Ulyess Gammons, NP  methocarbamol  (ROBAXIN ) 500 MG tablet Take 1 tablet (500 mg total) by mouth every 12 (twelve) hours as needed for muscle spasms. 12/10/22  Yes Levora Reas A, NP  Multiple Vitamins-Minerals (VISION VITAMINS) TABS Take 1 tablet by mouth daily.   Yes [provider]  olmesartan  (BENICAR ) 20 MG tablet Take 1 tablet (20 mg total) by mouth daily. 01/10/23  Yes Benjiman Bras, MD  Potassium 99 MG TABS Take by mouth.   Yes [provider]  amoxicillin -clavulanate (AUGMENTIN ) 875-125 MG tablet Take 1 tablet by mouth every 12 (twelve) hours. Patient not taking: Reported on 08/07/2023 01/18/23   Buena Carmine, NP   Social History   Socioeconomic History   Marital status: Married    Spouse name: Not on file   Number of children: Not on file   Years of education: Not on file   Highest education level: Not on file  Occupational History   Not on file   Tobacco Use   Smoking status: Never   Smokeless tobacco: Never  Vaping Use   Vaping status: Never Used  Substance and Sexual Activity   Alcohol use: Yes    Comment: occasionally   Drug use: No   Sexual activity: Not Currently  Other Topics Concern   Not on file  Social History Narrative   Not on file   Social Drivers of Health   Financial Resource Strain: Low Risk  (01/03/2023)   Overall Financial Resource Strain (CARDIA)    Difficulty of Paying Living Expenses: Not hard at all  Food Insecurity: No Food Insecurity (01/03/2023)  Hunger Vital Sign    Worried About Running Out of Food in the Last Year: Never true    Ran Out of Food in the Last Year: Never true  Transportation Needs: No Transportation Needs (01/03/2023)   PRAPARE - Administrator, Civil Service (Medical): No    Lack of Transportation (Non-Medical): No  Physical Activity: Sufficiently Active (01/03/2023)   Exercise Vital Sign    Days of Exercise per Week: 4 days    Minutes of Exercise per Session: 50 min  Stress: No Stress Concern Present (01/03/2023)   [employer] of Occupational Health - Occupational Stress Questionnaire    Feeling of Stress : Not at all  Social Connections: Moderately Integrated (01/03/2023)   Social Connection and Isolation Panel [NHANES]    Frequency of Communication with Friends and Family: More than three times a week    Frequency of Social Gatherings with Friends and Family: More than three times a week    Attends Religious Services: Never    Database administrator or Organizations: Yes    Attends Engineer, structural: More than 4 times per year    Marital Status: Married  Catering manager Violence: Not At Risk (01/03/2023)   Humiliation, Afraid, Rape, and Kick questionnaire    Fear of Current or Ex-Partner: No    Emotionally Abused: No    Physically Abused: No    Sexually Abused: No    Review of Systems Per HPI.   Objective:   Vitals:   08/07/23 1338   BP: 136/68  Pulse: 82  Temp: 98 F (36.7 C)  TempSrc: Temporal  SpO2: 98%  Weight: 148 lb 3.2 oz (67.2 kg)  Height: 5' (1.524 m)    Physical Exam Vitals reviewed.  Constitutional:      Appearance: Normal appearance. She is well-developed.  HENT:     Head: Normocephalic and atraumatic.     Right Ear: Tympanic membrane, ear canal and external ear normal. There is no impacted cerumen.     Left Ear: Tympanic membrane, ear canal and external ear normal. There is no impacted cerumen.  Eyes:     Conjunctiva/sclera: Conjunctivae normal.     Pupils: Pupils are equal, round, and reactive to light.  Neck:     Vascular: No carotid bruit.  Cardiovascular:     Rate and Rhythm: Normal rate and regular rhythm.     Heart sounds: Normal heart sounds.  Pulmonary:     Effort: Pulmonary effort is normal.     Breath sounds: Normal breath sounds.  Abdominal:     Palpations: Abdomen is soft. There is no pulsatile mass.     Tenderness: There is no abdominal tenderness.  Musculoskeletal:     Right lower leg: No edema.     Left lower leg: No edema.  Skin:    General: Skin is warm and dry.  Neurological:     Mental Status: She is alert and oriented to person, place, and time.  Psychiatric:        Mood and Affect: Mood normal.        Behavior: Behavior normal.    EKG, sinus rhythm, PR 146, QTc 414, no apparent acute findings.  Rate 62.  Compared to EKG on 06/27/2020, no apparent acute findings.  No significant changes.  Results for orders placed or performed in visit on 08/07/23  POCT urinalysis dipstick   Collection Time: 08/07/23  2:00 PM  Result Value Ref Range   Color, UA Pale  Yellow    Clarity, UA clear    Glucose, UA Negative Negative   Bilirubin, UA Negative    Ketones, UA Negative    Spec Grav, UA 1.015 1.010 - 1.025   Blood, UA Negative    pH, UA 7.0 5.0 - 8.0   Protein, UA Negative Negative   Urobilinogen, UA 0.2 0.2 or 1.0 E.U./dL   Nitrite, UA Negative    Leukocytes, UA  Negative Negative   Appearance     Odor       Assessment & Plan:  TRINYTI IREDALE is a 73 y.o. female . Leg cramping - Plan: POCT urinalysis dipstick Muscle cramps - Plan: Lipid panel, CK  - Check labs including CPK, hold statin temporarily although less likely cause.  She does notice symptoms primarily on workdays, possible overuse component.  Recheck 2 weeks.  RTC precautions if worse sooner.  Dizziness - Plan: CBC, EKG 12-Lead  - Increase fluid intake discussed, single episode of tachycardia, reassuring in office testing.  If return of symptoms, recommended evaluation with ER precautions.  Will continue to monitor with recheck in 2 weeks.  Labs as above.  Hearing difficulty, unspecified laterality - Plan: Ambulatory referral to ENT  - Has seen ENT prior, may need to have evaluation for hearing loss and treatment options.  Hyperlipidemia, unspecified hyperlipidemia type - Plan: Lipid panel  - As above will hold statin temporarily, check lipids and close follow-up in 2 weeks to discuss plan further  Essential hypertension - Plan: Comprehensive metabolic panel with GFR  - Stable with current regimen, no changes.  Unlikely cause of lightheadedness.  Stable in office, continue to monitor.  Check labs.  Initially with her lower leg pain on the right, ordered tib-fib x-ray to rule out bony injury/fracture but unlikely.  RTC precautions, close follow-up as above.   No orders of the defined types were placed in this encounter.  Patient Instructions  Track your amount of water you consume in ounces per day and let me know.  I will check some labs for your muscle cramps, but stop the cholesterol medicine temporarily. If cramps are improving off that med, we may change meds or how often you take it.  Return to the clinic or go to the nearest emergency room if any of your symptoms worsen or new symptoms occur.  I will check some labs for lightheadedness, and I have placed an x-ray order for  your leg to be performed at the [facility] location.  If any concerns on x-ray or labs I will let you know, but lets follow-up in 2 weeks and see how things are going.  Additionally I referred you back to ear nose and throat to discuss the difficulty with hearing.  Hang in there.    Return to the clinic or go to the nearest emergency room if any of your symptoms worsen or new symptoms occur.  Fall Prevention in the Home, Adult Falls can cause injuries and affect people of all ages. There are many simple things that you can do to make your home safe and to help prevent falls. If you need it, ask for help making these changes. What actions can I take to prevent falls? General information Use good lighting in all rooms. Make sure to: Replace any light bulbs that burn out. Turn on lights if it is dark and use night-lights. Keep items that you use often in easy-to-reach places. Lower the shelves around your home if needed. Move furniture so  that there are clear paths around it. Do not keep throw rugs or other things on the floor that can make you trip. If any of your floors are uneven, fix them. Add color or contrast paint or tape to clearly mark and help you see: Grab bars or handrails. First and last steps of staircases. Where the edge of each step is. If you use a ladder or stepladder: Make sure that it is fully opened. Do not climb a closed ladder. Make sure the sides of the ladder are locked in place. Have someone hold the ladder while you use it. Know where your pets are as you move through your home. What can I do in the bathroom?     Keep the floor dry. Clean up any water that is on the floor right away. Remove soap buildup in the bathtub or shower. Buildup makes bathtubs and showers slippery. Use non-skid mats or decals on the floor of the bathtub or shower. Attach bath mats securely with double-sided, non-slip rug tape. If you need to sit down while you are in the shower,  use a non-slip stool. Install grab bars by the toilet and in the bathtub and shower. Do not use towel bars as grab bars. What can I do in the bedroom? Make sure that you have a light by your bed that is easy to reach. Do not use any sheets or blankets on your bed that hang to the floor. Have a firm bench or chair with side arms that you can use for support when you get dressed. What can I do in the kitchen? Clean up any spills right away. If you need to reach something above you, use a sturdy step stool that has a grab bar. Keep electrical cables out of the way. Do not use floor polish or wax that makes floors slippery. What can I do with my stairs? Do not leave anything on the stairs. Make sure that you have a light switch at the top and the bottom of the stairs. Have them installed if you do not have them. Make sure that there are handrails on both sides of the stairs. Fix handrails that are broken or loose. Make sure that handrails are as long as the staircases. Install non-slip stair treads on all stairs in your home if they do not have carpet. Avoid having throw rugs at the top or bottom of stairs, or secure the rugs with carpet tape to prevent them from moving. Choose a carpet design that does not hide the edge of steps on the stairs. Make sure that carpet is firmly attached to the stairs. Fix any carpet that is loose or worn. What can I do on the outside of my home? Use bright outdoor lighting. Repair the edges of walkways and driveways and fix any cracks. Clear paths of anything that can make you trip, such as tools or rocks. Add color or contrast paint or tape to clearly mark and help you see high doorway thresholds. Trim any bushes or trees on the main path into your home. Check that handrails are securely fastened and in good repair. Both sides of all steps should have handrails. Install guardrails along the edges of any raised decks or porches. Have leaves, snow, and ice cleared  regularly. Use sand, salt, or ice melt on walkways during winter months if you live where there is ice and snow. In the garage, clean up any spills right away, including grease or oil spills. What other  actions can I take? Review your medicines with your health care provider. Some medicines can make you confused or feel dizzy. This can increase your chance of falling. Wear closed-toe shoes that fit well and support your feet. Wear shoes that have rubber soles and low heels. Use a cane, walker, scooter, or crutches that help you move around if needed. Talk with your provider about other ways that you can decrease your risk of falls. This may include seeing a physical therapist to learn to do exercises to improve movement and strength. Where to find more information Centers for Disease Control and Prevention, STEAD[employer]: TonerPromos.no [employer] on Aging: BaseRingTones.pl National Institute on Aging: BaseRingTones.pl Contact a health care provider if: You are afraid of falling at home. You feel weak, drowsy, or dizzy at home. You fall at home. Get help right away if you: Lose consciousness or have trouble moving after a fall. Have a fall that causes a head injury. These symptoms may be an emergency. Get help right away. Call 911. Do not wait to see if the symptoms will go away. Do not drive yourself to the hospital. This information is not intended to replace advice given to you by your health care provider. Make sure you discuss any questions you have with your health care provider. Document Revised: 11/20/2021 Document Reviewed: 11/20/2021 Elsevier Patient Education  2024 Elsevier Inc.     Signed,   Caro Christmas, MD [facility], [facility] 08/07/23 2:40 PM

## 2023-08-08 LAB — COMPREHENSIVE METABOLIC PANEL WITH GFR
ALT: 32 U/L (ref 0–35)
AST: 24 U/L (ref 0–37)
Albumin: 4.6 g/dL (ref 3.5–5.2)
Alkaline Phosphatase: 111 U/L (ref 39–117)
BUN: 16 mg/dL (ref 6–23)
CO2: 30 meq/L (ref 19–32)
Calcium: 9.7 mg/dL (ref 8.4–10.5)
Chloride: 101 meq/L (ref 96–112)
Creatinine, Ser: 0.76 mg/dL (ref 0.40–1.20)
GFR: 79.21 mL/min (ref >60.00)
Glucose, Bld: 83 mg/dL (ref 70–99)
Potassium: 4.1 meq/L (ref 3.5–5.1)
Sodium: 140 meq/L (ref 135–145)
Total Bilirubin: 0.5 mg/dL (ref 0.2–1.2)
Total Protein: 7.3 g/dL (ref 6.0–8.3)

## 2023-08-08 LAB — LIPID PANEL
Cholesterol: 203 mg/dL — ABNORMAL HIGH (ref 0–200)
HDL: 78.5 mg/dL (ref >39.00)
LDL Cholesterol: 106 mg/dL — ABNORMAL HIGH (ref 0–99)
NonHDL: 124.81
Total CHOL/HDL Ratio: 3
Triglycerides: 94 mg/dL (ref 0.0–149.0)
VLDL: 18.8 mg/dL (ref 0.0–40.0)

## 2023-08-08 LAB — CBC
HCT: 43.3 % (ref 36.0–46.0)
Hemoglobin: 14.5 g/dL (ref 12.0–15.0)
MCHC: 33.5 g/dL (ref 30.0–36.0)
MCV: 89.5 fl (ref 78.0–100.0)
Platelets: 387 10*3/uL (ref 150.0–400.0)
RBC: 4.85 Mil/uL (ref 3.87–5.11)
RDW: 14 % (ref 11.5–15.5)
WBC: 9 10*3/uL (ref 4.0–10.5)

## 2023-08-08 LAB — CK: Total CK: 250 U/L — ABNORMAL HIGH (ref 7–177)

## 2023-08-11 ENCOUNTER — Encounter: Payer: Self-pay | Admitting: Family Medicine

## 2023-08-12 ENCOUNTER — Telehealth: Payer: Self-pay

## 2023-08-12 NOTE — Telephone Encounter (Signed)
-----   Message from Benjiman Bras sent at 08/11/2023  3:20 PM EDT ----- Please call patient.  Cholesterol levels were a few points elevated but we can discuss that further at follow-up as we will need to decide if cholesterol medication could be the cause of her muscle cramps.  Muscle enzyme test was slightly elevated but not concerning at that level, again can discuss this at her follow-up visit.  Urine test, electrolytes, kidney, liver test and blood counts looked okay.  Let me know if there are questions

## 2023-08-12 NOTE — Telephone Encounter (Signed)
 Pt has been notified  Pt will discuss at F/U

## 2023-08-13 ENCOUNTER — Ambulatory Visit: Payer: Self-pay | Admitting: Family Medicine

## 2023-08-13 ENCOUNTER — Ambulatory Visit (INDEPENDENT_AMBULATORY_CARE_PROVIDER_SITE_OTHER)
Admission: RE | Admit: 2023-08-13 | Discharge: 2023-08-13 | Disposition: A | Discharge status: Home / Self Care | Source: Ambulatory Visit | Attending: Family Medicine | Admitting: Family Medicine

## 2023-08-13 DIAGNOSIS — S8991XA Unspecified injury of right lower leg, initial encounter: Secondary | ICD-10-CM | POA: Diagnosis not present

## 2023-08-13 DIAGNOSIS — M79661 Pain in right lower leg: Secondary | ICD-10-CM

## 2023-08-13 DIAGNOSIS — M1711 Unilateral primary osteoarthritis, right knee: Secondary | ICD-10-CM | POA: Diagnosis not present

## 2023-08-13 DIAGNOSIS — M85861 Other specified disorders of bone density and structure, right lower leg: Secondary | ICD-10-CM | POA: Diagnosis not present

## 2023-08-18 ENCOUNTER — Other Ambulatory Visit: Payer: Self-pay | Admitting: Family Medicine

## 2023-08-18 DIAGNOSIS — E785 Hyperlipidemia, unspecified: Secondary | ICD-10-CM

## 2023-08-22 ENCOUNTER — Other Ambulatory Visit: Payer: Self-pay | Admitting: Family Medicine

## 2023-08-22 DIAGNOSIS — I10 Essential (primary) hypertension: Secondary | ICD-10-CM

## 2023-08-29 ENCOUNTER — Encounter: Payer: Self-pay | Admitting: Family Medicine

## 2023-08-29 ENCOUNTER — Ambulatory Visit: Admitting: Family Medicine

## 2023-08-29 VITALS — BP 142/74 | HR 82 | Temp 98.5°F | Ht 60.0 in | Wt 148.4 lb

## 2023-08-29 DIAGNOSIS — Z8669 Personal history of other diseases of the nervous system and sense organs: Secondary | ICD-10-CM

## 2023-08-29 DIAGNOSIS — R252 Cramp and spasm: Secondary | ICD-10-CM

## 2023-08-29 DIAGNOSIS — R270 Ataxia, unspecified: Secondary | ICD-10-CM

## 2023-08-29 DIAGNOSIS — R42 Dizziness and giddiness: Secondary | ICD-10-CM | POA: Diagnosis not present

## 2023-08-29 NOTE — Progress Notes (Signed)
 Subjective:  Patient ID: Jessica Stephens, female    DOB: March 14, 1951  Age: 73 y.o. MRN: [id number]  CC:  Chief Complaint  Patient presents with   Follow-up    Still having charlie horses, feels in the thigh area and her feet. 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 Feels unsteady, has to hold on to things to keep her balance, does not feel super steady without holding on to things    HPI Jessica Stephens presents for   Follow-up from 08/07/23.   Leg cramps Reported intermittent symptoms previous few months but increased frequency on the prior month at her last visit.  Reported hydrating sufficiently but did not track amounts.  Cramps were more noticeable on days she had worked, thought to be a possible overuse component.  She is on a statin with Lipitor 20 mg daily.  Recommended she hold statin temporarily to see if that may be contributing CPK level was mildly elevated at 250.  Previously 156. Leg cramps have improved some.  Improve when standing up - pain resolves.  Off the statin since last visit.  Some cramps in feet since last visit - this is new since last visit- better with walking.  On feet 8 hours per day for work.   Dizziness Also discussed last visit, intermittent symptoms since October at her last physical.  Had not yet had labs completed.  History of allergies treated with Flonase  and Zyrtec , hypertension treated with amlodipine  and olmesartan .  Had reported some intermittent heart racing when she was moving quickly or in a hurry but not necessarily when she was feeling dizzy and denied chest pain or new headaches.  Did report a fall a few weeks prior, with soreness in the right lower leg at times since that fall.  Blood pressure stable last visit. CBC was normal.  CMP was normal, urinalysis normal with specific gravity 1.015 on May 7.  Today she reports difficulty with balance, feeling unsteady and having to hold onto objects at work.    Still feeling dizzy during the day. No falls since last visit.  No focal weakness, slurred speech or headaches.  Feels off balance.   Record of diet brought to the office today.  And getting a little 16 ounces of water, 8 ounce cup of coffee, 4 ounces juice, 16 ounces of water on most days, some days with 8 ounces of tea.  Some days with 2 coffees.  Tea is decaf.   Saw optho in April - possible visual cause - receives injections for macular degeneration.    Out of town next week on vacation.  History Patient Active Problem List   Diagnosis Date Noted   Sinus headache 07/25/2021   Ulcerative colitis (HCC) 11/04/2020   History of colonic polyps 11/04/2020   Age-related osteoporosis without current pathological fracture 03/11/2020   Pelvic prolapse 03/08/2020   Bilateral impacted cerumen 08/04/2019   Dermatitis of ear canal, bilateral 08/04/2019   Left ear hearing loss 08/04/2019   Nasal congestion 08/04/2019   Lower urinary tract symptoms (LUTS) 12/05/2016   Acute UTI 12/05/2016   Flank pain, acute 12/05/2016   Prolonged Q-T interval on ECG 08/21/2014   Lobar pneumonia (HCC) 08/21/2014   CAP (community acquired pneumonia) 08/19/2014   Community acquired pneumonia 08/19/2014   Sepsis (HCC) 08/19/2014   Hypokalemia 08/19/2014   HYPERLIPIDEMIA 05/29/2010   DYSPNEA 05/29/2010   CHEST PAIN, ATYPICAL 05/02/2010   ESOPHAGEAL STRICTURE 04/07/2010   Diaphragmatic  hernia 04/07/2010   ULCERATIVE PROCTITIS 04/07/2010   Arthropathy 04/07/2010   LUQ PAIN 04/07/2010   TRANSAMINASES, SERUM, ELEVATED 04/07/2010   VITAMIN B12 DEFICIENCY 01/30/2008   ANXIETY, CHRONIC 01/29/2008   Essential hypertension 01/29/2008   CHRONIC MAXILLARY SINUSITIS 04/24/2007   G E R D 04/24/2007   UNSPECIFIED OSTEOPOROSIS 04/24/2007   ULCERATIVE COLITIS, LEFT SIDED 02/25/2007   Past Medical History:  Diagnosis Date   Abnormal transaminases    Anxiety    Arthritis    Esophageal stricture    GERD  (gastroesophageal reflux disease)    Hiatal hernia    Hyperlipidemia    Hypertension    Maxillary sinusitis    OAB (overactive bladder)    Proctitis    Ulcerative colitis (HCC)    left sided   Vitamin B12 deficiency    Past Surgical History:  Procedure Laterality Date   CATARACT EXTRACTION     CESAREAN SECTION     nose surg     NOSE SURGERY     Allergies  Allergen Reactions   Alendronate Sodium    Other Other (See Comments)    "messes up blood"    Sulfonamide Derivatives    Prior to Admission medications   Medication Sig Start Date End Date Taking? Authorizing Provider  albuterol  (VENTOLIN  HFA) 108 (90 Base) MCG/ACT inhaler Inhale 2 puffs into the lungs every 4 (four) hours as needed for wheezing or shortness of breath. 03/23/22  Yes Ann Keto, MD  amLODipine  (NORVASC ) 10 MG tablet TAKE 1 TABLET BY MOUTH EVERY DAY 08/22/23  Yes Benjiman Bras, MD  atorvastatin  (LIPITOR) 20 MG tablet TAKE 1 TABLET BY MOUTH EVERY DAY 08/19/23  Yes Benjiman Bras, MD  cetirizine  (ZYRTEC  ALLERGY) 10 MG tablet Take 1 tablet (10 mg total) by mouth daily. 12/23/20  Yes Ulyess Gammons, NP  fluticasone  (FLONASE ) 50 MCG/ACT nasal spray SPRAY 2 SPRAYS INTO EACH NOSTRIL EVERY DAY 07/16/23  Yes Benjiman Bras, MD  mesalamine  (LIALDA ) 1.2 g EC tablet Take 1-2 tablets (1.2-2.4 g total) by mouth 2 (two) times daily. 12/23/20  Yes Ulyess Gammons, NP  methocarbamol  (ROBAXIN ) 500 MG tablet Take 1 tablet (500 mg total) by mouth every 12 (twelve) hours as needed for muscle spasms. 12/10/22  Yes Levora Reas A, NP  Multiple Vitamins-Minerals (VISION VITAMINS) TABS Take 1 tablet by mouth daily.   Yes [provider]  olmesartan  (BENICAR ) 20 MG tablet Take 1 tablet (20 mg total) by mouth daily. 01/10/23  Yes Benjiman Bras, MD  Potassium 99 MG TABS Take by mouth.   Yes [provider]  amoxicillin -clavulanate (AUGMENTIN ) 875-125 MG tablet Take 1 tablet by mouth every 12 (twelve)  hours. Patient not taking: Reported on 08/29/2023 01/18/23   Buena Carmine, NP   Social History   Socioeconomic History   Marital status: Married    Spouse name: Not on file   Number of children: Not on file   Years of education: Not on file   Highest education level: Not on file  Occupational History   Not on file  Tobacco Use   Smoking status: Never   Smokeless tobacco: Never  Vaping Use   Vaping status: Never Used  Substance and Sexual Activity   Alcohol use: Yes    Comment: occasionally   Drug use: No   Sexual activity: Not Currently  Other Topics Concern   Not on file  Social History Narrative   Not on file   Social Drivers of  Health   Financial Resource Strain: Low Risk  (01/03/2023)   Overall Financial Resource Strain (CARDIA)    Difficulty of Paying Living Expenses: Not hard at all  Food Insecurity: No Food Insecurity (01/03/2023)   Hunger Vital Sign    Worried About Running Out of Food in the Last Year: Never true    Ran Out of Food in the Last Year: Never true  Transportation Needs: No Transportation Needs (01/03/2023)   PRAPARE - Administrator, Civil Service (Medical): No    Lack of Transportation (Non-Medical): No  Physical Activity: Sufficiently Active (01/03/2023)   Exercise Vital Sign    Days of Exercise per Week: 4 days    Minutes of Exercise per Session: 50 min  Stress: No Stress Concern Present (01/03/2023)   [employer] of Occupational Health - Occupational Stress Questionnaire    Feeling of Stress : Not at all  Social Connections: Moderately Integrated (01/03/2023)   Social Connection and Isolation Panel [NHANES]    Frequency of Communication with Friends and Family: More than three times a week    Frequency of Social Gatherings with Friends and Family: More than three times a week    Attends Religious Services: Never    Database administrator or Organizations: Yes    Attends Engineer, structural: More than 4 times per  year    Marital Status: Married  Catering manager Violence: Not At Risk (01/03/2023)   Humiliation, Afraid, Rape, and Kick questionnaire    Fear of Current or Ex-Partner: No    Emotionally Abused: No    Physically Abused: No    Sexually Abused: No    Review of Systems   Objective:   Vitals:   08/29/23 1606  BP: (!) 142/74  Pulse: 82  Temp: 98.5 F (36.9 C)  TempSrc: Temporal  SpO2: 95%  Weight: 148 lb 6.4 oz (67.3 kg)  Height: 5' (1.524 m)     Physical Exam Vitals reviewed.  Constitutional:      Appearance: Normal appearance. She is well-developed.  HENT:     Head: Normocephalic and atraumatic.  Eyes:     Conjunctiva/sclera: Conjunctivae normal.     Pupils: Pupils are equal, round, and reactive to light.     Comments: 1-2 beats horizontal nystagmus left and right with minimal dizziness on quick look to the left.  Neck:     Vascular: No carotid bruit.  Cardiovascular:     Rate and Rhythm: Normal rate and regular rhythm.     Heart sounds: Normal heart sounds.  Pulmonary:     Effort: Pulmonary effort is normal.     Breath sounds: Normal breath sounds.  Abdominal:     Palpations: Abdomen is soft. There is no pulsatile mass.     Tenderness: There is no abdominal tenderness.  Musculoskeletal:     Right lower leg: No edema.     Left lower leg: No edema.  Skin:    General: Skin is warm and dry.  Neurological:     Mental Status: She is alert and oriented to person, place, and time.     GCS: GCS eye subscore is 4. GCS verbal subscore is 5. GCS motor subscore is 6.     Cranial Nerves: No cranial nerve deficit, dysarthria or facial asymmetry.     Motor: No weakness, tremor, atrophy or pronator drift.     Coordination: Romberg sign negative. Finger-Nose-Finger Test and Heel to [facility] Test normal. Rapid alternating movements normal.  Gait: Gait abnormal (Antalgic, unsteady gait with leaning to the left or right during gait, cautious.).  Psychiatric:        Mood and  Affect: Mood normal.        Behavior: Behavior normal.    61 minutes spent during visit, including chart review, review of labs from last visit, discussion of progression of foot cramps, balance difficulties, counseling and assimilation of information, exam, discussion of plan, and chart completion.   Assessment & Plan:  Jessica Stephens is a 73 y.o. female . Leg cramping - Plan: CK, Magnesium, CANCELED: CK, CANCELED: Magnesium  - Some improvement with lower leg cramps on stopping statin temporarily.  Does have some cramps into the feet but lower leg cramps have improved.  Will hold on restarting statin for now.  Mildly elevated CPK last visit, repeat testing along with magnesium.  Fluid intake reviewed on her summary, some room for improvement, 40 to 64 ounces of water per day recommended.  Dizziness - Plan: MR Brain W Wo Contrast, Ambulatory referral to Neurology, TSH, CANCELED: TSH Ataxia - Plan: MR Brain W Wo Contrast, Ambulatory referral to Neurology, B12, TSH,CANCELED: B12, CANCELED: TSH History of macular degeneration  - Intermittent dizziness previously, but does report of the past 6 months or so she has had increasing dizziness, unsteadiness holding onto objects for balance without recent falls.  Unsteady on gait in office without focal weakness appreciated.  This could be in part related to her macular degeneration, possible middle ear component with few beats horizontal nystagmus.  However with progressive symptoms, underlying hypertension, hyperlipidemia has CVA risk factors will check MRI brain, refer to neurology.  Labs as above including B12, TSH.  Fall precautions discussed, recommended assistive device, such as cane to minimize risk of falling.  Recheck 1 month with ER precautions given.  No orders of the defined types were placed in this encounter.  Patient Instructions  Please have labs at the [facility] location tomorrow if possible.  I am rechecking the muscle enzyme test  and a couple other test for your muscle cramps and dizziness.  [facility] or xray: Walk in 8:30-4:30 during weekdays, no appointment needed [address]  [place], [place] [address]   If you feel that the muscle cramps are better off of the cholesterol medicine okay to stop taking it for now.  Will discuss this further at your next visit.  I did order an MRI of your brain for the persistent dizziness and balance difficulties just to make sure we do not see anything of concern but have also referred you to neurology.  I do recommend using a cane or other assistive device when you are walking to lessen chance of falling.  See other information below and fall prevention.  Make sure you are drinking plenty of fluids during the day, primarily water with a goal of 48 to 64 ounces of water per day.    Fall Prevention in the Home, Adult Falls can cause injuries and affect people of all ages. There are many simple things that you can do to make your home safe and to help prevent falls. If you need it, ask for help making these changes. What actions can I take to prevent falls? General information Use good lighting in all rooms. Make sure to: Replace any light bulbs that burn out. Turn on lights if it is dark and use night-lights. Keep items that you use often in easy-to-reach places. Lower the shelves around your  home if needed. Move furniture so that there are clear paths around it. Do not keep throw rugs or other things on the floor that can make you trip. If any of your floors are uneven, fix them. Add color or contrast paint or tape to clearly mark and help you see: Grab bars or handrails. First and last steps of staircases. Where the edge of each step is. If you use a ladder or stepladder: Make sure that it is fully opened. Do not climb a closed ladder. Make sure the sides of the ladder are locked in place. Have someone hold the ladder while you use it. Know where your pets are as you  move through your home. What can I do in the bathroom?     Keep the floor dry. Clean up any water that is on the floor right away. Remove soap buildup in the bathtub or shower. Buildup makes bathtubs and showers slippery. Use non-skid mats or decals on the floor of the bathtub or shower. Attach bath mats securely with double-sided, non-slip rug tape. If you need to sit down while you are in the shower, use a non-slip stool. Install grab bars by the toilet and in the bathtub and shower. Do not use towel bars as grab bars. What can I do in the bedroom? Make sure that you have a light by your bed that is easy to reach. Do not use any sheets or blankets on your bed that hang to the floor. Have a firm bench or chair with side arms that you can use for support when you get dressed. What can I do in the kitchen? Clean up any spills right away. If you need to reach something above you, use a sturdy step stool that has a grab bar. Keep electrical cables out of the way. Do not use floor polish or wax that makes floors slippery. What can I do with my stairs? Do not leave anything on the stairs. Make sure that you have a light switch at the top and the bottom of the stairs. Have them installed if you do not have them. Make sure that there are handrails on both sides of the stairs. Fix handrails that are broken or loose. Make sure that handrails are as long as the staircases. Install non-slip stair treads on all stairs in your home if they do not have carpet. Avoid having throw rugs at the top or bottom of stairs, or secure the rugs with carpet tape to prevent them from moving. Choose a carpet design that does not hide the edge of steps on the stairs. Make sure that carpet is firmly attached to the stairs. Fix any carpet that is loose or worn. What can I do on the outside of my home? Use bright outdoor lighting. Repair the edges of walkways and driveways and fix any cracks. Clear paths of anything  that can make you trip, such as tools or rocks. Add color or contrast paint or tape to clearly mark and help you see high doorway thresholds. Trim any bushes or trees on the main path into your home. Check that handrails are securely fastened and in good repair. Both sides of all steps should have handrails. Install guardrails along the edges of any raised decks or porches. Have leaves, snow, and ice cleared regularly. Use sand, salt, or ice melt on walkways during winter months if you live where there is ice and snow. In the garage, clean up any spills right away, including  grease or oil spills. What other actions can I take? Review your medicines with your health care provider. Some medicines can make you confused or feel dizzy. This can increase your chance of falling. Wear closed-toe shoes that fit well and support your feet. Wear shoes that have rubber soles and low heels. Use a cane, walker, scooter, or crutches that help you move around if needed. Talk with your provider about other ways that you can decrease your risk of falls. This may include seeing a physical therapist to learn to do exercises to improve movement and strength. Where to find more information Centers for Disease Control and Prevention, STEADI: TonerPromos.no [employer] on Aging: BaseRingTones.pl National Institute on Aging: BaseRingTones.pl Contact a health care provider if: You are afraid of falling at home. You feel weak, drowsy, or dizzy at home. You fall at home. Get help right away if you: Lose consciousness or have trouble moving after a fall. Have a fall that causes a head injury. These symptoms may be an emergency. Get help right away. Call 911. Do not wait to see if the symptoms will go away. Do not drive yourself to the hospital. This information is not intended to replace advice given to you by your health care provider. Make sure you discuss any questions you have with your health care provider. Document  Revised: 11/20/2021 Document Reviewed: 11/20/2021 Elsevier Patient Education  2024 Elsevier Inc.  Dizziness Dizziness is a common problem. It makes you feel unsteady or light-headed. You may feel like you're about to faint. Dizziness can lead to getting hurt if you stumble or fall. It's more common to feel dizzy if you're an older adult. Many things can cause you to feel dizzy. These include: Medicines. Dehydration. This is when there's not enough water in your body. Illness. Follow these instructions at home: Eating and drinking  Drink enough fluid to keep your pee (urine) pale yellow. This helps keep you from getting dehydrated. Try to drink more clear fluids, such as water. Do not drink alcohol. Try to limit how much caffeine you take in. Try to limit how much salt, also called sodium, you take in. Activity Try not to make quick movements. Stand up slowly from sitting in a chair. Steady yourself until you feel okay. In the morning, first sit up on the side of the bed. When you feel okay, hold onto something and slowly stand up. Do this until you know that your balance is okay. If you need to stand in one place for a long time, move your legs often. Tighten and relax the muscles in your legs while you're standing. Do not drive or use machines if you feel dizzy. Avoid bending down if you feel dizzy. Place items in your home so you can reach them without leaning over. Lifestyle Do not smoke, vape, or use products with nicotine or tobacco in them. If you need help quitting, talk with your health care provider. Try to lower your stress level. You can do this by using methods like yoga or meditation. Talk with your provider if you need help. General instructions Watch your dizziness for any changes. Take your medicines only as told by your provider. Talk with your provider if you think you're dizzy because of a medicine you're taking. Tell a friend or a family member that you're feeling  dizzy. If they spot any changes in your behavior, have them call your provider. Contact a health care provider if: Your dizziness doesn't go away, or  you have new symptoms. Your dizziness gets worse. You feel like you may vomit. You have trouble hearing. You have a fever. You have neck pain or a stiff neck. You fall or get hurt. Get help right away if: You vomit each time you eat or drink. You have watery poop and can't eat or drink. You have trouble talking, walking, swallowing, or using your arms, hands, or legs. You feel very weak. You're bleeding. You're not thinking clearly, or you have trouble forming sentences. A friend or family member may spot this. Your vision changes, or you get a very bad headache. These symptoms may be an emergency. Call 911 right away. Do not wait to see if the symptoms will go away. Do not drive yourself to the hospital. This information is not intended to replace advice given to you by your health care provider. Make sure you discuss any questions you have with your health care provider. Document Revised: 12/20/2022 Document Reviewed: 05/03/2022 Elsevier Patient Education  2024 Elsevier Inc.      Signed,   Caro Christmas, MD [facility], [facility] 08/29/23 5:10 PM

## 2023-08-29 NOTE — Patient Instructions (Addendum)
 Please have labs at the [facility] location tomorrow if possible.  I am rechecking the muscle enzyme test and a couple other test for your muscle cramps and dizziness.  [facility] or xray: Walk in 8:30-4:30 during weekdays, no appointment needed [address]  [place], [place] [address]   If you feel that the muscle cramps are better off of the cholesterol medicine okay to stop taking it for now.  Will discuss this further at your next visit.  I did order an MRI of your brain for the persistent dizziness and balance difficulties just to make sure we do not see anything of concern but have also referred you to neurology.  I do recommend using a cane or other assistive device when you are walking to lessen chance of falling.  See other information below and fall prevention.  Make sure you are drinking plenty of fluids during the day, primarily water with a goal of 48 to 64 ounces of water per day.    Fall Prevention in the Home, Adult Falls can cause injuries and affect people of all ages. There are many simple things that you can do to make your home safe and to help prevent falls. If you need it, ask for help making these changes. What actions can I take to prevent falls? General information Use good lighting in all rooms. Make sure to: Replace any light bulbs that burn out. Turn on lights if it is dark and use night-lights. Keep items that you use often in easy-to-reach places. Lower the shelves around your home if needed. Move furniture so that there are clear paths around it. Do not keep throw rugs or other things on the floor that can make you trip. If any of your floors are uneven, fix them. Add color or contrast paint or tape to clearly mark and help you see: Grab bars or handrails. First and last steps of staircases. Where the edge of each step is. If you use a ladder or stepladder: Make sure that it is fully opened. Do not climb a closed ladder. Make sure the sides of the  ladder are locked in place. Have someone hold the ladder while you use it. Know where your pets are as you move through your home. What can I do in the bathroom?     Keep the floor dry. Clean up any water that is on the floor right away. Remove soap buildup in the bathtub or shower. Buildup makes bathtubs and showers slippery. Use non-skid mats or decals on the floor of the bathtub or shower. Attach bath mats securely with double-sided, non-slip rug tape. If you need to sit down while you are in the shower, use a non-slip stool. Install grab bars by the toilet and in the bathtub and shower. Do not use towel bars as grab bars. What can I do in the bedroom? Make sure that you have a light by your bed that is easy to reach. Do not use any sheets or blankets on your bed that hang to the floor. Have a firm bench or chair with side arms that you can use for support when you get dressed. What can I do in the kitchen? Clean up any spills right away. If you need to reach something above you, use a sturdy step stool that has a grab bar. Keep electrical cables out of the way. Do not use floor polish or wax that makes floors slippery. What can I do with my  stairs? Do not leave anything on the stairs. Make sure that you have a light switch at the top and the bottom of the stairs. Have them installed if you do not have them. Make sure that there are handrails on both sides of the stairs. Fix handrails that are broken or loose. Make sure that handrails are as long as the staircases. Install non-slip stair treads on all stairs in your home if they do not have carpet. Avoid having throw rugs at the top or bottom of stairs, or secure the rugs with carpet tape to prevent them from moving. Choose a carpet design that does not hide the edge of steps on the stairs. Make sure that carpet is firmly attached to the stairs. Fix any carpet that is loose or worn. What can I do on the outside of my home? Use bright  outdoor lighting. Repair the edges of walkways and driveways and fix any cracks. Clear paths of anything that can make you trip, such as tools or rocks. Add color or contrast paint or tape to clearly mark and help you see high doorway thresholds. Trim any bushes or trees on the main path into your home. Check that handrails are securely fastened and in good repair. Both sides of all steps should have handrails. Install guardrails along the edges of any raised decks or porches. Have leaves, snow, and ice cleared regularly. Use sand, salt, or ice melt on walkways during winter months if you live where there is ice and snow. In the garage, clean up any spills right away, including grease or oil spills. What other actions can I take? Review your medicines with your health care provider. Some medicines can make you confused or feel dizzy. This can increase your chance of falling. Wear closed-toe shoes that fit well and support your feet. Wear shoes that have rubber soles and low heels. Use a cane, walker, scooter, or crutches that help you move around if needed. Talk with your provider about other ways that you can decrease your risk of falls. This may include seeing a physical therapist to learn to do exercises to improve movement and strength. Where to find more information Centers for Disease Control and Prevention, [employer]: TonerPromos.no [employer] on Aging: BaseRingTones.pl National Institute on Aging: BaseRingTones.pl Contact a health care provider if: You are afraid of falling at home. You feel weak, drowsy, or dizzy at home. You fall at home. Get help right away if you: Lose consciousness or have trouble moving after a fall. Have a fall that causes a head injury. These symptoms may be an emergency. Get help right away. Call 911. Do not wait to see if the symptoms will go away. Do not drive yourself to the hospital. This information is not intended to replace advice given to you by your health  care provider. Make sure you discuss any questions you have with your health care provider. Document Revised: 11/20/2021 Document Reviewed: 11/20/2021 Elsevier Patient Education  2024 Elsevier Inc.  Dizziness Dizziness is a common problem. It makes you feel unsteady or light-headed. You may feel like you're about to faint. Dizziness can lead to getting hurt if you stumble or fall. It's more common to feel dizzy if you're an older adult. Many things can cause you to feel dizzy. These include: Medicines. Dehydration. This is when there's not enough water in your body. Illness. Follow these instructions at home: Eating and drinking  Drink enough fluid to keep your pee (urine) pale yellow. This  helps keep you from getting dehydrated. Try to drink more clear fluids, such as water. Do not drink alcohol. Try to limit how much caffeine you take in. Try to limit how much salt, also called sodium, you take in. Activity Try not to make quick movements. Stand up slowly from sitting in a chair. Steady yourself until you feel okay. In the morning, first sit up on the side of the bed. When you feel okay, hold onto something and slowly stand up. Do this until you know that your balance is okay. If you need to stand in one place for a long time, move your legs often. Tighten and relax the muscles in your legs while you're standing. Do not drive or use machines if you feel dizzy. Avoid bending down if you feel dizzy. Place items in your home so you can reach them without leaning over. Lifestyle Do not smoke, vape, or use products with nicotine or tobacco in them. If you need help quitting, talk with your health care provider. Try to lower your stress level. You can do this by using methods like yoga or meditation. Talk with your provider if you need help. General instructions Watch your dizziness for any changes. Take your medicines only as told by your provider. Talk with your provider if you think  you're dizzy because of a medicine you're taking. Tell a friend or a family member that you're feeling dizzy. If they spot any changes in your behavior, have them call your provider. Contact a health care provider if: Your dizziness doesn't go away, or you have new symptoms. Your dizziness gets worse. You feel like you may vomit. You have trouble hearing. You have a fever. You have neck pain or a stiff neck. You fall or get hurt. Get help right away if: You vomit each time you eat or drink. You have watery poop and can't eat or drink. You have trouble talking, walking, swallowing, or using your arms, hands, or legs. You feel very weak. You're bleeding. You're not thinking clearly, or you have trouble forming sentences. A friend or family member may spot this. Your vision changes, or you get a very bad headache. These symptoms may be an emergency. Call 911 right away. Do not wait to see if the symptoms will go away. Do not drive yourself to the hospital. This information is not intended to replace advice given to you by your health care provider. Make sure you discuss any questions you have with your health care provider. Document Revised: 12/20/2022 Document Reviewed: 05/03/2022 Elsevier Patient Education  2024 [employer].

## 2023-08-30 ENCOUNTER — Other Ambulatory Visit

## 2023-08-30 DIAGNOSIS — R270 Ataxia, unspecified: Secondary | ICD-10-CM | POA: Diagnosis not present

## 2023-08-30 DIAGNOSIS — R42 Dizziness and giddiness: Secondary | ICD-10-CM | POA: Diagnosis not present

## 2023-08-30 DIAGNOSIS — R252 Cramp and spasm: Secondary | ICD-10-CM | POA: Diagnosis not present

## 2023-08-30 LAB — TSH: TSH: 1.13 u[IU]/mL (ref 0.35–5.50)

## 2023-08-30 LAB — CK: Total CK: 94 U/L (ref 7–177)

## 2023-08-30 LAB — VITAMIN B12: Vitamin B-12: 357 pg/mL (ref 211–911)

## 2023-08-30 LAB — MAGNESIUM: Magnesium: 2.1 mg/dL (ref 1.5–2.5)

## 2023-09-04 ENCOUNTER — Ambulatory Visit: Payer: Self-pay | Admitting: Family Medicine

## 2023-09-19 ENCOUNTER — Other Ambulatory Visit: Payer: Self-pay | Admitting: Family Medicine

## 2023-09-19 DIAGNOSIS — I10 Essential (primary) hypertension: Secondary | ICD-10-CM

## 2023-09-26 DIAGNOSIS — H903 Sensorineural hearing loss, bilateral: Secondary | ICD-10-CM | POA: Diagnosis not present

## 2023-09-26 DIAGNOSIS — H906 Mixed conductive and sensorineural hearing loss, bilateral: Secondary | ICD-10-CM | POA: Diagnosis not present

## 2023-09-26 DIAGNOSIS — R42 Dizziness and giddiness: Secondary | ICD-10-CM | POA: Diagnosis not present

## 2023-09-27 ENCOUNTER — Ambulatory Visit: Admitting: Family Medicine

## 2023-10-11 DIAGNOSIS — H353124 Nonexudative age-related macular degeneration, left eye, advanced atrophic with subfoveal involvement: Secondary | ICD-10-CM | POA: Diagnosis not present

## 2023-10-11 DIAGNOSIS — H35373 Puckering of macula, bilateral: Secondary | ICD-10-CM | POA: Diagnosis not present

## 2023-10-11 DIAGNOSIS — H353111 Nonexudative age-related macular degeneration, right eye, early dry stage: Secondary | ICD-10-CM | POA: Diagnosis not present

## 2023-10-11 DIAGNOSIS — H43812 Vitreous degeneration, left eye: Secondary | ICD-10-CM | POA: Diagnosis not present

## 2023-10-17 ENCOUNTER — Other Ambulatory Visit: Payer: Self-pay | Admitting: Family Medicine

## 2023-10-17 DIAGNOSIS — I10 Essential (primary) hypertension: Secondary | ICD-10-CM

## 2023-10-29 ENCOUNTER — Encounter (HOSPITAL_COMMUNITY): Payer: Self-pay | Admitting: *Deleted

## 2023-10-29 ENCOUNTER — Other Ambulatory Visit: Payer: Self-pay

## 2023-10-29 ENCOUNTER — Ambulatory Visit (HOSPITAL_COMMUNITY)
Admission: EM | Admit: 2023-10-29 | Discharge: 2023-10-29 | Disposition: A | Discharge status: Home / Self Care | Attending: Emergency Medicine | Admitting: Emergency Medicine

## 2023-10-29 DIAGNOSIS — N3001 Acute cystitis with hematuria: Secondary | ICD-10-CM | POA: Diagnosis not present

## 2023-10-29 LAB — POCT URINALYSIS DIP (MANUAL ENTRY)
Bilirubin, UA: NEGATIVE
Glucose, UA: NEGATIVE mg/dL
Ketones, POC UA: NEGATIVE mg/dL
Nitrite, UA: NEGATIVE
Protein Ur, POC: NEGATIVE mg/dL
Spec Grav, UA: <=1.005 — AB (ref 1.010–1.025)
Urobilinogen, UA: 0.2 U/dL
pH, UA: 5.5 (ref 5.0–8.0)

## 2023-10-29 MED ORDER — CEPHALEXIN 500 MG PO CAPS: 500.0000 mg | ORAL_CAPSULE | Freq: Two times a day (BID) | ORAL | 0 refills | Status: AC

## 2023-10-29 NOTE — ED Triage Notes (Signed)
 PT reports urinary urgency that started yesterday. Sx's started to get worse . Pt developed chills and back pain. PT drank cranberry juice with out relief.

## 2023-10-29 NOTE — ED Provider Notes (Signed)
 [facility]    CSN: [id number] Arrival date & time: 10/29/23  1100      History   Chief Complaint Chief Complaint  Patient presents with   Urinary Frequency    HPI Jessica Stephens is a 73 y.o. female.   Patient presents with urinary frequency/urgency, and bladder pressure that began yesterday.  Patient states that upon waking this morning her symptoms worsened.  Patient states that she has been drinking cranberry juice and lots of water with minimal relief.  Patient states that at times she has had chills and some left lower back pain as well. Denies hematuria, fever, body aches, and flank pain.  The history is provided by the patient and medical records.  Urinary Frequency    Past Medical History:  Diagnosis Date   Abnormal transaminases    Anxiety    Arthritis    Esophageal stricture    GERD (gastroesophageal reflux disease)    Hiatal hernia    Hyperlipidemia    Hypertension    Maxillary sinusitis    OAB (overactive bladder)    Proctitis    Ulcerative colitis (HCC)    left sided   Vitamin B12 deficiency     Patient Active Problem List   Diagnosis Date Noted   Sinus headache 07/25/2021   Ulcerative colitis (HCC) 11/04/2020   History of colonic polyps 11/04/2020   Age-related osteoporosis without current pathological fracture 03/11/2020   Pelvic prolapse 03/08/2020   Bilateral impacted cerumen 08/04/2019   Dermatitis of ear canal, bilateral 08/04/2019   Left ear hearing loss 08/04/2019   Nasal congestion 08/04/2019   Lower urinary tract symptoms (LUTS) 12/05/2016   Acute UTI 12/05/2016   Flank pain, acute 12/05/2016   Prolonged Q-T interval on ECG 08/21/2014   Lobar pneumonia (HCC) 08/21/2014   CAP (community acquired pneumonia) 08/19/2014   Community acquired pneumonia 08/19/2014   Sepsis (HCC) 08/19/2014   Hypokalemia 08/19/2014   HYPERLIPIDEMIA 05/29/2010   DYSPNEA 05/29/2010   CHEST PAIN, ATYPICAL 05/02/2010   ESOPHAGEAL STRICTURE  04/07/2010   Diaphragmatic hernia 04/07/2010   ULCERATIVE PROCTITIS 04/07/2010   Arthropathy 04/07/2010   LUQ PAIN 04/07/2010   TRANSAMINASES, SERUM, ELEVATED 04/07/2010   VITAMIN B12 DEFICIENCY 01/30/2008   ANXIETY, CHRONIC 01/29/2008   Essential hypertension 01/29/2008   CHRONIC MAXILLARY SINUSITIS 04/24/2007   G E R D 04/24/2007   UNSPECIFIED OSTEOPOROSIS 04/24/2007   ULCERATIVE COLITIS, LEFT SIDED 02/25/2007    Past Surgical History:  Procedure Laterality Date   CATARACT EXTRACTION     CESAREAN SECTION     nose surg     NOSE SURGERY      OB History   No obstetric history on file.      Home Medications    Prior to Admission medications   Medication Sig Start Date End Date Taking? Authorizing Provider  albuterol  (VENTOLIN  HFA) 108 (90 Base) MCG/ACT inhaler Inhale 2 puffs into the lungs every 4 (four) hours as needed for wheezing or shortness of breath. 03/23/22  Yes Vonna Sharlet POUR, MD  amLODipine  (NORVASC ) 10 MG tablet TAKE 1 TABLET BY MOUTH EVERY DAY 10/17/23  Yes Levora Reyes SAUNDERS, MD  atorvastatin  (LIPITOR) 20 MG tablet TAKE 1 TABLET BY MOUTH EVERY DAY 08/19/23  Yes Levora Reyes SAUNDERS, MD  cephALEXin  (KEFLEX ) 500 MG capsule Take 1 capsule (500 mg total) by mouth 2 (two) times daily for 7 days. 10/29/23 11/05/23 Yes Latoyia Tecson A, NP  cetirizine  (ZYRTEC  ALLERGY) 10 MG tablet Take 1 tablet (  10 mg total) by mouth daily. 12/23/20  Yes Kip Ade, NP  fluticasone  (FLONASE ) 50 MCG/ACT nasal spray SPRAY 2 SPRAYS INTO EACH NOSTRIL EVERY DAY 07/16/23  Yes Levora Reyes SAUNDERS, MD  mesalamine  (LIALDA ) 1.2 g EC tablet Take 1-2 tablets (1.2-2.4 g total) by mouth 2 (two) times daily. 12/23/20  Yes Kip Ade, NP  methocarbamol  (ROBAXIN ) 500 MG tablet Take 1 tablet (500 mg total) by mouth every 12 (twelve) hours as needed for muscle spasms. 12/10/22  Yes Johnie Flaming A, NP  Multiple Vitamins-Minerals (VISION VITAMINS) TABS Take 1 tablet by mouth daily.   Yes [provider]  olmesartan  (BENICAR ) 20 MG tablet Take 1 tablet (20 mg total) by mouth daily. 01/10/23  Yes Levora Reyes SAUNDERS, MD  Potassium 99 MG TABS Take by mouth.   Yes [provider]    Family History Family History  Problem Relation Age of Onset   Arthritis Mother    Dementia Mother    Diabetes Father    Hyperlipidemia Father    Hypertension Father     Social History Social History   Tobacco Use   Smoking status: Never   Smokeless tobacco: Never  Vaping Use   Vaping status: Never Used  Substance Use Topics   Alcohol use: Yes    Comment: occasionally   Drug use: No     Allergies   Alendronate sodium, Other, and Sulfonamide derivatives   Review of Systems Review of Systems  Genitourinary:  Positive for frequency.   Per HPI  Physical Exam Triage Vital Signs ED Triage Vitals  Encounter Vitals Group     BP 10/29/23 1118 (!) 159/73     Girls Systolic BP Percentile --      Girls Diastolic BP Percentile --      Boys Systolic BP Percentile --      Boys Diastolic BP Percentile --      Pulse Rate 10/29/23 1118 68     Resp 10/29/23 1118 20     Temp --      Temp src --      SpO2 10/29/23 1118 98 %     Weight --      Height --      Head Circumference --      Peak Flow --      Pain Score 10/29/23 1115 6     Pain Loc --      Pain Education --      Exclude from Growth Chart --    No data found.  Updated Vital Signs BP (!) 159/73   Pulse 68   Temp 97.6 F (36.4 C) (Oral)   Resp 20   SpO2 98%   Visual Acuity Right Eye Distance:   Left Eye Distance:   Bilateral Distance:    Right Eye Near:   Left Eye Near:    Bilateral Near:     Physical Exam Vitals and nursing note reviewed.  Constitutional:      General: She is awake. She is not in acute distress.    Appearance: Normal appearance. She is well-developed and well-groomed. She is not ill-appearing.  Abdominal:     General: Abdomen is flat. Bowel sounds are normal.     Palpations:  Abdomen is soft.     Tenderness: There is abdominal tenderness in the suprapubic area. There is no right CVA tenderness or left CVA tenderness.  Skin:    General: Skin is warm and dry.  Neurological:  Mental Status: She is alert.  Psychiatric:        Behavior: Behavior is cooperative.      UC Treatments / Results  Labs (all labs ordered are listed, but only abnormal results are displayed) Labs Reviewed  POCT URINALYSIS DIP (MANUAL ENTRY) - Abnormal; Notable for the following components:      Result Value   Spec Grav, UA <=1.005 (*)    Blood, UA small (*)    Leukocytes, UA Small (1+) (*)    All other components within normal limits  URINE CULTURE    EKG   Radiology No results found.  Procedures Procedures (including critical care time)  Medications Ordered in UC Medications - No data to display  Initial Impression / Assessment and Plan / UC Course  I have reviewed the triage vital signs and the nursing notes.  Pertinent labs & imaging results that were available during my care of the patient were reviewed by me and considered in my medical decision making (see chart for details).     Patient is overall well-appearing.  Vitals are stable.  Mild suprapubic tenderness noted.  Without CVA tenderness.  Urinalysis reveals small leukocytes and small RBCs, will send culture to confirm.  Empirically treating with cephalexin  for acute cystitis.  Discussed follow-up, return, and strict ER precautions. Final Clinical Impressions(s) / UC Diagnoses   Final diagnoses:  Acute cystitis with hematuria     Discharge Instructions      Start taking cephalexin  twice daily for 7 days for urinary tract infection. Your urine culture results will return over the next few days and someone will call if results require an adjustment in treatment. Make sure you are drinking lots of water and getting some rest. Follow-up with your primary care provider or return here as needed. If you  develop persistent fever, flank pain, vomiting, blood in your urine, or weakness please seek immediate medical treatment in the emergency department.    ED Prescriptions     Medication Sig Dispense Auth. Provider   cephALEXin  (KEFLEX ) 500 MG capsule Take 1 capsule (500 mg total) by mouth 2 (two) times daily for 7 days. 14 capsule Johnie Flaming A, NP      PDMP not reviewed this encounter.   Johnie Flaming A, NP 10/29/23 [phone]

## 2023-10-29 NOTE — Discharge Instructions (Signed)
 Start taking cephalexin  twice daily for 7 days for urinary tract infection. Your urine culture results will return over the next few days and someone will call if results require an adjustment in treatment. Make sure you are drinking lots of water and getting some rest. Follow-up with your primary care provider or return here as needed. If you develop persistent fever, flank pain, vomiting, blood in your urine, or weakness please seek immediate medical treatment in the emergency department.

## 2023-10-31 ENCOUNTER — Ambulatory Visit (HOSPITAL_COMMUNITY): Payer: Self-pay

## 2023-10-31 ENCOUNTER — Encounter: Payer: Self-pay | Admitting: Pharmacist

## 2023-10-31 LAB — URINE CULTURE: Culture: 70000 — AB

## 2023-10-31 NOTE — Progress Notes (Signed)
 Pharmacy Quality Measure Review  This patient is appearing on a report for being at risk of failing the adherence measure for cholesterol (statin) medications this calendar year.   Medication: atorvastatin   Last fill date: 07/22/2023 for 30 day supply  Patient was having leg cramps and instructed to hold atorvastatin  in light of lightly elevated CK of 250. She was off statin for 2 weeks and CK returned to normal. 08/29/2023 - Dr Levora recommended she continue to hold statin.   No action needed at this time. Patient holding statin.   Madelin Ray, PharmD Clinical Pharmacist [facility]  Population Health [phone]

## 2023-11-14 ENCOUNTER — Other Ambulatory Visit: Payer: Self-pay | Admitting: Family Medicine

## 2023-11-14 DIAGNOSIS — I10 Essential (primary) hypertension: Secondary | ICD-10-CM

## 2023-12-06 DIAGNOSIS — H353111 Nonexudative age-related macular degeneration, right eye, early dry stage: Secondary | ICD-10-CM | POA: Diagnosis not present

## 2023-12-06 DIAGNOSIS — H43812 Vitreous degeneration, left eye: Secondary | ICD-10-CM | POA: Diagnosis not present

## 2023-12-06 DIAGNOSIS — H353124 Nonexudative age-related macular degeneration, left eye, advanced atrophic with subfoveal involvement: Secondary | ICD-10-CM | POA: Diagnosis not present

## 2023-12-06 DIAGNOSIS — H35373 Puckering of macula, bilateral: Secondary | ICD-10-CM | POA: Diagnosis not present

## 2023-12-06 DIAGNOSIS — H31091 Other chorioretinal scars, right eye: Secondary | ICD-10-CM | POA: Diagnosis not present

## 2023-12-12 ENCOUNTER — Other Ambulatory Visit: Payer: Self-pay | Admitting: Family Medicine

## 2023-12-12 DIAGNOSIS — I10 Essential (primary) hypertension: Secondary | ICD-10-CM

## 2023-12-26 DIAGNOSIS — H903 Sensorineural hearing loss, bilateral: Secondary | ICD-10-CM | POA: Diagnosis not present

## 2023-12-30 ENCOUNTER — Other Ambulatory Visit: Payer: Self-pay | Admitting: Family Medicine

## 2023-12-30 DIAGNOSIS — I10 Essential (primary) hypertension: Secondary | ICD-10-CM

## 2023-12-31 ENCOUNTER — Other Ambulatory Visit: Payer: Self-pay

## 2023-12-31 ENCOUNTER — Telehealth: Payer: Self-pay | Admitting: Family Medicine

## 2023-12-31 DIAGNOSIS — I10 Essential (primary) hypertension: Secondary | ICD-10-CM

## 2023-12-31 MED ORDER — AMLODIPINE BESYLATE 10 MG PO TABS: 10.0000 mg | ORAL_TABLET | Freq: Every day | ORAL | 1 refills | Status: AC

## 2023-12-31 NOTE — Telephone Encounter (Signed)
 Called patient and she was very frustrated that I called her at work. Patient said that she received a call from someone else. Unsure of who that was as it was not documented. I sent in a 90 day rx for patient and I was letting her know that.

## 2023-12-31 NOTE — Telephone Encounter (Signed)
 Encourage patient to contact the pharmacy for refills or they can request refills through [facility]  (Please schedule appointment if patient has not been seen in over a year)    WHAT PHARMACY WOULD THEY LIKE THIS SENT TO: [id number] - [place] - [address]   MEDICATION NAME & DOSE: amLODipine  (NORVASC ) 10 MG tablet   NOTES/COMMENTS FROM PATIENT: Patient is requesting 90-day supply at this time     Front office please notify patient: It takes 48-72 hours to process rx refill requests Ask patient to call pharmacy to ensure rx is ready before heading there.

## 2024-01-07 ENCOUNTER — Encounter

## 2024-01-09 DIAGNOSIS — E669 Obesity, unspecified: Secondary | ICD-10-CM | POA: Diagnosis not present

## 2024-01-09 DIAGNOSIS — I10 Essential (primary) hypertension: Secondary | ICD-10-CM | POA: Diagnosis not present

## 2024-01-09 DIAGNOSIS — Z1211 Encounter for screening for malignant neoplasm of colon: Secondary | ICD-10-CM | POA: Diagnosis not present

## 2024-01-09 DIAGNOSIS — E782 Mixed hyperlipidemia: Secondary | ICD-10-CM | POA: Diagnosis not present

## 2024-01-09 DIAGNOSIS — K5100 Ulcerative (chronic) pancolitis without complications: Secondary | ICD-10-CM | POA: Diagnosis not present

## 2024-01-09 DIAGNOSIS — Z860100 Personal history of colon polyps, unspecified: Secondary | ICD-10-CM | POA: Diagnosis not present

## 2024-01-12 ENCOUNTER — Other Ambulatory Visit: Payer: Self-pay | Admitting: Family Medicine

## 2024-01-12 DIAGNOSIS — I10 Essential (primary) hypertension: Secondary | ICD-10-CM

## 2024-01-13 NOTE — Telephone Encounter (Signed)
 Requested Prescriptions   Pending Prescriptions Disp Refills   olmesartan  (BENICAR ) 20 MG tablet [Pharmacy Med Name: OLMESARTAN  MEDOXOMIL 20 MG TAB] 90 tablet 3    Sig: TAKE 1 TABLET BY MOUTH EVERY DAY     Date of patient request: 01/12/2024 Last office visit: 08/29/2023 Upcoming visit: 03/05/2024 Date of last refill: 01/10/2023 Last refill amount: 90x3

## 2024-01-14 NOTE — Telephone Encounter (Signed)
 Labs noted from May, office visit May 29th.  Appointment scheduled December 4.  Refill ordered.

## 2024-01-29 NOTE — Progress Notes (Signed)
 Jessica Stephens                                          MRN: [id number]   01/29/2024   The [facility] Quality Team Specialist reviewed this patient medical record for the purposes of chart review for care gap closure. The following were reviewed: chart review for care gap closure-controlling blood pressure.    [facility] Quality Team

## 2024-02-06 ENCOUNTER — Encounter: Admitting: Family Medicine

## 2024-02-07 DIAGNOSIS — H43812 Vitreous degeneration, left eye: Secondary | ICD-10-CM | POA: Diagnosis not present

## 2024-02-07 DIAGNOSIS — H35373 Puckering of macula, bilateral: Secondary | ICD-10-CM | POA: Diagnosis not present

## 2024-02-07 DIAGNOSIS — H353124 Nonexudative age-related macular degeneration, left eye, advanced atrophic with subfoveal involvement: Secondary | ICD-10-CM | POA: Diagnosis not present

## 2024-02-07 DIAGNOSIS — H31091 Other chorioretinal scars, right eye: Secondary | ICD-10-CM | POA: Diagnosis not present

## 2024-02-07 DIAGNOSIS — H353111 Nonexudative age-related macular degeneration, right eye, early dry stage: Secondary | ICD-10-CM | POA: Diagnosis not present

## 2024-03-05 ENCOUNTER — Encounter: Payer: Self-pay | Admitting: Family Medicine

## 2024-03-05 ENCOUNTER — Other Ambulatory Visit: Payer: Self-pay | Admitting: Family Medicine

## 2024-03-05 ENCOUNTER — Ambulatory Visit: Admitting: Family Medicine

## 2024-03-05 VITALS — BP 128/62 | HR 87 | Temp 98.5°F | Resp 16 | Ht 60.0 in | Wt 148.6 lb

## 2024-03-05 DIAGNOSIS — I10 Essential (primary) hypertension: Secondary | ICD-10-CM

## 2024-03-05 DIAGNOSIS — R270 Ataxia, unspecified: Secondary | ICD-10-CM

## 2024-03-05 DIAGNOSIS — E785 Hyperlipidemia, unspecified: Secondary | ICD-10-CM

## 2024-03-05 DIAGNOSIS — Z0000 Encounter for general adult medical examination without abnormal findings: Secondary | ICD-10-CM

## 2024-03-05 DIAGNOSIS — K5190 Ulcerative colitis, unspecified, without complications: Secondary | ICD-10-CM

## 2024-03-05 DIAGNOSIS — J309 Allergic rhinitis, unspecified: Secondary | ICD-10-CM

## 2024-03-05 DIAGNOSIS — R42 Dizziness and giddiness: Secondary | ICD-10-CM

## 2024-03-05 DIAGNOSIS — R252 Cramp and spasm: Secondary | ICD-10-CM

## 2024-03-05 NOTE — Patient Instructions (Addendum)
 If cream from dermatology is not working, please follow up with dermatologist to decide on other treatments - call your dermatologist.   I will check cholesterol today and we will follow to discuss meds. Intermittent dosing may be an option (few doses per week) but we will discuss plan at follow up. We can also discuss knee/leg pain at that visit.   I have placed another referral to neurology.   Return to the clinic or go to the nearest emergency room if any of your symptoms worsen or new symptoms occur.  Preventive Care 55 Years and Older, Female Preventive care refers to lifestyle choices and visits with your health care provider that can promote health and wellness. Preventive care visits are also called wellness exams. What can I expect for my preventive care visit? Counseling Your health care provider may ask you questions about your: Medical history, including: Past medical problems. Family medical history. Pregnancy and menstrual history. History of falls. Current health, including: Memory and ability to understand (cognition). Emotional well-being. Home life and relationship well-being. Sexual activity and sexual health. Lifestyle, including: Alcohol, nicotine or tobacco, and drug use. Access to firearms. Diet, exercise, and sleep habits. Work and work astronomer. Sunscreen use. Safety issues such as seatbelt and bike helmet use. Physical exam Your health care provider will check your: Height and weight. These may be used to calculate your BMI (body mass index). BMI is a measurement that tells if you are at a healthy weight. Waist circumference. This measures the distance around your waistline. This measurement also tells if you are at a healthy weight and may help predict your risk of certain diseases, such as type 2 diabetes and high blood pressure. Heart rate and blood pressure. Body temperature. Skin for abnormal spots. What immunizations do I need?  Vaccines are  usually given at various ages, according to a schedule. Your health care provider will recommend vaccines for you based on your age, medical history, and lifestyle or other factors, such as travel or where you work. What tests do I need? Screening Your health care provider may recommend screening tests for certain conditions. This may include: Lipid and cholesterol levels. Hepatitis C test. Hepatitis B test. HIV (human immunodeficiency virus) test. STI (sexually transmitted infection) testing, if you are at risk. Lung cancer screening. Colorectal cancer screening. Diabetes screening. This is done by checking your blood sugar (glucose) after you have not eaten for a while (fasting). Mammogram. Talk with your health care provider about how often you should have regular mammograms. BRCA-related cancer screening. This may be done if you have a family history of breast, ovarian, tubal, or peritoneal cancers. Bone density scan. This is done to screen for osteoporosis. Talk with your health care provider about your test results, treatment options, and if necessary, the need for more tests. Follow these instructions at home: Eating and drinking  Eat a diet that includes fresh fruits and vegetables, whole grains, lean protein, and low-fat dairy products. Limit your intake of foods with high amounts of sugar, saturated fats, and salt. Take vitamin and mineral supplements as recommended by your health care provider. Do not drink alcohol if your health care provider tells you not to drink. If you drink alcohol: Limit how much you have to 0-1 drink a day. Know how much alcohol is in your drink. In the [place] one drink equals one 12 oz bottle of beer (355 mL), one 5 oz glass of wine (148 mL), or one 1 oz glass of  hard liquor (44 mL). Lifestyle Brush your teeth every morning and night with fluoride toothpaste. Floss one time each day. Exercise for at least 30 minutes 5 or more days each week. Do not use  any products that contain nicotine or tobacco. These products include cigarettes, chewing tobacco, and vaping devices, such as e-cigarettes. If you need help quitting, ask your health care provider. Do not use drugs. If you are sexually active, practice safe sex. Use a condom or other form of protection in order to prevent STIs. Take aspirin only as told by your health care provider. Make sure that you understand how much to take and what form to take. Work with your health care provider to find out whether it is safe and beneficial for you to take aspirin daily. Ask your health care provider if you need to take a cholesterol-lowering medicine (statin). Find healthy ways to manage stress, such as: Meditation, yoga, or listening to music. Journaling. Talking to a trusted person. Spending time with friends and family. Minimize exposure to UV radiation to reduce your risk of skin cancer. Safety Always wear your seat belt while driving or riding in a vehicle. Do not drive: If you have been drinking alcohol. Do not ride with someone who has been drinking. When you are tired or distracted. While texting. If you have been using any mind-altering substances or drugs. Wear a helmet and other protective equipment during sports activities. If you have firearms in your house, make sure you follow all gun safety procedures. What's next? Visit your health care provider once a year for an annual wellness visit. Ask your health care provider how often you should have your eyes and teeth checked. Stay up to date on all vaccines. This information is not intended to replace advice given to you by your health care provider. Make sure you discuss any questions you have with your health care provider. Document Revised: 09/14/2020 Document Reviewed: 09/14/2020 Elsevier Patient Education  2024 [employer].

## 2024-03-05 NOTE — Progress Notes (Unsigned)
 Subjective:  Patient ID: Jessica Stephens, female    DOB: 04/29/1950  Age: 73 y.o. MRN: [id number]  CC:  Chief Complaint  Patient presents with   Annual Exam    Patient would like to make an appointment after the holidays to discuss her knee pain.    HPI Jessica Stephens presents for Annual Exam PCP, me Ophthalmology, Dr. Jarold Gastroenterology, Dr. Kristie, ulcerative colitis and IBS, recent visit in October, uses [facility] .  ENT, Dr. Carlie, bilateral sensorineural hearing loss. Dermatology, previously seen by Dr. Shona, plans to see new dermatologist- rash in lower legs for a few years. Prior creams are not working - last visit in April. Told she has eczema.   Plans follow-up to discuss knee pain.  No acute injury. Thinks from being on feet all day.   Chronic sinus congestion, allergies. No recent need for albuterol . Stable with flonase  nasal spray.   No results found for: HGBA1C   Hyperlipidemia: Treated with Lipitor 20 mg daily.  Prior leg cramps, noted primarily with work.  Some improvement when discussed in May.  Had stopped the statin temporarily.  CK level mildly elevated and improved on recheck.  Has not restarted statin. No further charlie horses. Had been taking statin once per day prior. Wears compression stockings at work.  Lab Results  Component Value Date   CHOL 203 (H) 08/07/2023   HDL 78.50 08/07/2023   LDLCALC 106 (H) 08/07/2023   TRIG 94.0 08/07/2023   CHOLHDL 3 08/07/2023   Lab Results  Component Value Date   ALT 32 08/07/2023   AST 24 08/07/2023   ALKPHOS 111 08/07/2023   BILITOT 0.5 08/07/2023   Hypertension: Treated with amlodipine  10 mg daily.  Olmesartan  20 mg daily.  Intermittent dizziness, concerns with balance discussed at her May visit.  No falls.  No focal weakness.  Unsteady gait noted in office.  Possible middle ear component with nystagmus.  Referred to neurology, MRI brain, B12, TSH ordered.  Assistive device with cane recommended to  minimize risk of falling.  1 month follow-up planned. B12 TSH and magnesium were normal.  Did not have MRI brain performed.  Referred to neurology but unable to contact patient after multiple messages left.  I have not seen her since May visit.  June visit was canceled.  Did not have time to make appointment to see neurologist. Still feels off balance at times, but not at work. Notices at home - sometimes feels tipsy. No falls.  No focal weakness, slurred speech, no headaches. No CP/palpitations.  Would like to meet with neuro after the holidays.  About the same balance issues. Comes and goes.   No home readings.  BP Readings from Last 3 Encounters:  03/05/24 128/62  10/29/23 (!) 159/73  08/29/23 (!) 142/74   Lab Results  Component Value Date   CREATININE 0.76 08/07/2023        08/07/2023    1:35 PM 01/10/2023   12:59 PM 01/03/2023    1:49 PM 01/01/2022   12:54 PM 08/22/2021    9:02 AM  Depression screen PHQ 2/9  Decreased Interest 0 0 0 0 0  Down, Depressed, Hopeless 0 0 0 0 0  PHQ - 2 Score 0 0 0 0 0  Altered sleeping 1 1 2   0  Tired, decreased energy 1 1 2   0  Change in appetite 0 0 0  0  Feeling bad or failure about yourself  0 0 0  0  Trouble  concentrating 0 0 0  0  Moving slowly or fidgety/restless 0 0 0  0  Suicidal thoughts 0 0 0  0  PHQ-9 Score 2  2  4    0   Difficult doing work/chores   Not difficult at all  Not difficult at all     Data saved with a previous flowsheet row definition    Health Maintenance  Topic Date Due   Zoster Vaccines- Shingrix (2 of 2) 06/19/2021   COVID-19 Vaccine (6 - 2025-26 season) 03/21/2024 (Originally 12/02/2023)   Medicare Annual Wellness (AWV)  04/05/2024 (Originally 01/03/2024)   DTaP/Tdap/Td (2 - Td or Tdap) 02/07/2025   Mammogram  07/10/2025   Colonoscopy  04/20/2032   Pneumococcal Vaccine: 50+ Years  Completed   Influenza Vaccine  Completed   Bone Density Scan  Completed   Hepatitis C Screening  Completed   Meningococcal B  Vaccine  Aged Out  Colonoscopy in February.  Mammogram in April. Repeat 1 year.   Immunization History  Administered Date(s) Administered   Fluad Quad(high Dose 65+) 12/08/2018, 12/23/2020, 01/25/2022   INFLUENZA, HIGH DOSE SEASONAL PF 01/26/2018, 01/31/2023, 12/19/2023   Influenza Split 01/03/2012   Influenza Whole 01/30/2008   Influenza,inj,Quad PF,6+ Mos 01/22/2013, 01/27/2014, 02/08/2015, 01/20/2016   Influenza-Unspecified 02/15/2017, 12/28/2019   PFIZER(Purple Top)SARS-COV-2 Vaccination 05/17/2019, 06/09/2019, 01/16/2020, 08/16/2020, 03/01/2021   Pneumococcal Conjugate-13 01/27/2014   Pneumococcal Polysaccharide-23 12/22/2018   Tdap 02/08/2015   Zoster Recombinant(Shingrix) 04/24/2021   Zoster, Unspecified 04/24/2021  Shingrix at [facility]. Declines covid booster.   No results found. Optho visit in past few months.   Hearing better with use of hearing aids.   Dental: new dentist after the holidays, had appt 05/09/23.   Alcohol: rare - less than once per month.   Tobacco: none  Exercise: active with work.    History Patient Active Problem List   Diagnosis Date Noted   Sinus headache 07/25/2021   Ulcerative colitis (HCC) 11/04/2020   History of colonic polyps 11/04/2020   Age-related osteoporosis without current pathological fracture 03/11/2020   Pelvic prolapse 03/08/2020   Bilateral impacted cerumen 08/04/2019   Dermatitis of ear canal, bilateral 08/04/2019   Left ear hearing loss 08/04/2019   Nasal congestion 08/04/2019   Bilateral sensorineural hearing loss 08/04/2019   Lower urinary tract symptoms (LUTS) 12/05/2016   Acute UTI 12/05/2016   Flank pain, acute 12/05/2016   Prolonged Q-T interval on ECG 08/21/2014   Lobar pneumonia 08/21/2014   CAP (community acquired pneumonia) 08/19/2014   Community acquired pneumonia 08/19/2014   Sepsis (HCC) 08/19/2014   Hypokalemia 08/19/2014   HYPERLIPIDEMIA 05/29/2010   DYSPNEA 05/29/2010   CHEST PAIN, ATYPICAL 05/02/2010    ESOPHAGEAL STRICTURE 04/07/2010   Diaphragmatic hernia 04/07/2010   ULCERATIVE PROCTITIS 04/07/2010   Arthropathy 04/07/2010   LUQ PAIN 04/07/2010   TRANSAMINASES, SERUM, ELEVATED 04/07/2010   VITAMIN B12 DEFICIENCY 01/30/2008   ANXIETY, CHRONIC 01/29/2008   Essential hypertension 01/29/2008   CHRONIC MAXILLARY SINUSITIS 04/24/2007   G E R D 04/24/2007   UNSPECIFIED OSTEOPOROSIS 04/24/2007   ULCERATIVE COLITIS, LEFT SIDED 02/25/2007   Past Medical History:  Diagnosis Date   Abnormal transaminases    Anxiety    Arthritis    Esophageal stricture    GERD (gastroesophageal reflux disease)    Hiatal hernia    Hyperlipidemia    Hypertension    Maxillary sinusitis    OAB (overactive bladder)    Proctitis    Ulcerative colitis (HCC)    left sided  Vitamin B12 deficiency    Past Surgical History:  Procedure Laterality Date   CATARACT EXTRACTION     CESAREAN SECTION     nose surg     NOSE SURGERY     Allergies  Allergen Reactions   Alendronate Sodium    Other Other (See Comments)    messes up blood    Sulfonamide Derivatives    Prior to Admission medications   Medication Sig Start Date End Date Taking? Authorizing Provider  albuterol  (VENTOLIN  HFA) 108 (90 Base) MCG/ACT inhaler Inhale 2 puffs into the lungs every 4 (four) hours as needed for wheezing or shortness of breath. 03/23/22  Yes Vonna Sharlet POUR, MD  amLODipine  (NORVASC ) 10 MG tablet Take 1 tablet (10 mg total) by mouth daily. 12/31/23  Yes Levora Reyes SAUNDERS, MD  atorvastatin  (LIPITOR) 20 MG tablet TAKE 1 TABLET BY MOUTH EVERY DAY 08/19/23  Yes Levora Reyes SAUNDERS, MD  cetirizine  (ZYRTEC  ALLERGY) 10 MG tablet Take 1 tablet (10 mg total) by mouth daily. 12/23/20  Yes Kip Ade, NP  fluticasone  (FLONASE ) 50 MCG/ACT nasal spray SPRAY 2 SPRAYS INTO EACH NOSTRIL EVERY DAY 07/16/23  Yes Levora Reyes SAUNDERS, MD  mesalamine  (LIALDA ) 1.2 g EC tablet Take 1-2 tablets (1.2-2.4 g total) by mouth 2 (two) times daily.  12/23/20  Yes Kip Ade, NP  methocarbamol  (ROBAXIN ) 500 MG tablet Take 1 tablet (500 mg total) by mouth every 12 (twelve) hours as needed for muscle spasms. 12/10/22  Yes Johnie Flaming A, NP  Multiple Vitamins-Minerals (VISION VITAMINS) TABS Take 1 tablet by mouth daily.   Yes [provider]  olmesartan  (BENICAR ) 20 MG tablet TAKE 1 TABLET BY MOUTH EVERY DAY 01/14/24  Yes Levora Reyes SAUNDERS, MD  Potassium 99 MG TABS Take by mouth.   Yes [provider]   Social History   Socioeconomic History   Marital status: Married    Spouse name: Not on file   Number of children: Not on file   Years of education: Not on file   Highest education level: Not on file  Occupational History   Not on file  Tobacco Use   Smoking status: Never   Smokeless tobacco: Never  Vaping Use   Vaping status: Never Used  Substance and Sexual Activity   Alcohol use: Yes    Comment: occasionally   Drug use: No   Sexual activity: Not Currently  Other Topics Concern   Not on file  Social History Narrative   Not on file   Social Drivers of Health   Financial Resource Strain: Low Risk  (01/03/2023)   Overall Financial Resource Strain (CARDIA)    Difficulty of Paying Living Expenses: Not hard at all  Food Insecurity: No Food Insecurity (01/03/2023)   Hunger Vital Sign    Worried About Running Out of Food in the Last Year: Never true    Ran Out of Food in the Last Year: Never true  Transportation Needs: No Transportation Needs (01/03/2023)   PRAPARE - Administrator, Civil Service (Medical): No    Lack of Transportation (Non-Medical): No  Physical Activity: Sufficiently Active (01/03/2023)   Exercise Vital Sign    Days of Exercise per Week: 4 days    Minutes of Exercise per Session: 50 min  Stress: No Stress Concern Present (01/03/2023)   [employer] of Occupational Health - Occupational Stress Questionnaire    Feeling of Stress : Not at all  Social Connections:  Moderately Integrated (01/03/2023)  Social Advertising Account Executive    Frequency of Communication with Friends and Family: More than three times a week    Frequency of Social Gatherings with Friends and Family: More than three times a week    Attends Religious Services: Never    Database Administrator or Organizations: Yes    Attends Engineer, Structural: More than 4 times per year    Marital Status: Married  Catering Manager Violence: Not At Risk (01/03/2023)   Humiliation, Afraid, Rape, and Kick questionnaire    Fear of Current or Ex-Partner: No    Emotionally Abused: No    Physically Abused: No    Sexually Abused: No    Review of Systems 13 point review of systems per patient health survey noted.  Negative other than as indicated above or in HPI.    Objective:   Vitals:   03/05/24 1407  BP: 128/62  Pulse: 87  Resp: 16  Temp: 98.5 F (36.9 C)  TempSrc: Temporal  SpO2: 97%  Weight: 148 lb 9.6 oz (67.4 kg)  Height: 5' (1.524 m)   {Vitals History (Optional):23777}  Physical Exam Constitutional:      Appearance: She is well-developed.  HENT:     Head: Normocephalic and atraumatic.     Right Ear: External ear normal.     Left Ear: External ear normal.  Eyes:     Conjunctiva/sclera: Conjunctivae normal.     Pupils: Pupils are equal, round, and reactive to light.  Neck:     Thyroid : No thyromegaly.  Cardiovascular:     Rate and Rhythm: Normal rate and regular rhythm.     Heart sounds: Normal heart sounds. No murmur heard. Pulmonary:     Effort: Pulmonary effort is normal. No respiratory distress.     Breath sounds: Normal breath sounds. No wheezing.  Abdominal:     General: Bowel sounds are normal.     Palpations: Abdomen is soft.     Tenderness: There is no abdominal tenderness.  Musculoskeletal:        General: No tenderness. Normal range of motion.     Cervical back: Normal range of motion and neck supple.  Lymphadenopathy:     Cervical: No  cervical adenopathy.  Skin:    General: Skin is warm and dry.  Neurological:     Mental Status: She is alert and oriented to person, place, and time.  Psychiatric:        Behavior: Behavior normal.        Thought Content: Thought content normal.      Assessment & Plan:  Jessica Stephens is a 73 y.o. female . No diagnosis found.   No orders of the defined types were placed in this encounter.  There are no Patient Instructions on file for this visit.    Signed,   Reyes Pines, MD [facility], [facility] 03/05/24 2:45 PM

## 2024-03-06 LAB — LIPID PANEL
Cholesterol: 227 mg/dL — ABNORMAL HIGH (ref 0–200)
HDL: 63.1 mg/dL (ref >39.00)
LDL Cholesterol: 139 mg/dL — ABNORMAL HIGH (ref 0–99)
NonHDL: 163.9
Total CHOL/HDL Ratio: 4
Triglycerides: 124 mg/dL (ref 0.0–149.0)
VLDL: 24.8 mg/dL (ref 0.0–40.0)

## 2024-03-06 LAB — COMPREHENSIVE METABOLIC PANEL WITH GFR
ALT: 26 U/L (ref 0–35)
AST: 25 U/L (ref 0–37)
Albumin: 4.6 g/dL (ref 3.5–5.2)
Alkaline Phosphatase: 96 U/L (ref 39–117)
BUN: 17 mg/dL (ref 6–23)
CO2: 30 meq/L (ref 19–32)
Calcium: 9.4 mg/dL (ref 8.4–10.5)
Chloride: 102 meq/L (ref 96–112)
Creatinine, Ser: 0.64 mg/dL (ref 0.40–1.20)
GFR: 88.97 mL/min (ref >60.00)
Glucose, Bld: 89 mg/dL (ref 70–99)
Potassium: 3.8 meq/L (ref 3.5–5.1)
Sodium: 139 meq/L (ref 135–145)
Total Bilirubin: 0.4 mg/dL (ref 0.2–1.2)
Total Protein: 7.6 g/dL (ref 6.0–8.3)

## 2024-03-09 ENCOUNTER — Encounter: Payer: Self-pay | Admitting: Student in an Organized Health Care Education/Training Program

## 2024-03-09 ENCOUNTER — Ambulatory Visit: Admitting: Student in an Organized Health Care Education/Training Program

## 2024-03-09 VITALS — BP 157/91 | HR 77 | Temp 98.6°F | Wt 148.0 lb

## 2024-03-09 DIAGNOSIS — H6502 Acute serous otitis media, left ear: Secondary | ICD-10-CM

## 2024-03-09 DIAGNOSIS — J069 Acute upper respiratory infection, unspecified: Secondary | ICD-10-CM | POA: Insufficient documentation

## 2024-03-09 DIAGNOSIS — H6690 Otitis media, unspecified, unspecified ear: Secondary | ICD-10-CM | POA: Insufficient documentation

## 2024-03-09 LAB — POCT INFLUENZA A/B
Influenza A, POC: NEGATIVE
Influenza B, POC: NEGATIVE

## 2024-03-09 LAB — POC COVID19 BINAXNOW: SARS Coronavirus 2 Ag: NEGATIVE

## 2024-03-09 LAB — POCT RAPID STREP A (OFFICE): Rapid Strep A Screen: NEGATIVE

## 2024-03-09 MED ORDER — AMOXICILLIN-POT CLAVULANATE 875-125 MG PO TABS: 1.0000 | ORAL_TABLET | Freq: Two times a day (BID) | ORAL | 0 refills | Status: AC

## 2024-03-09 NOTE — Patient Instructions (Signed)
 Instructions for Symptomatic Care of a Viral Upper Respiratory Infection  Most viral upper respiratory infections (colds) resolve on their own within 1-2 weeks. Antibiotics are not helpful and may cause harm. The following treatments can help relieve symptoms:   Acetaminophen  (Tylenol ): Use for fever, headache, or body aches. Adults and children 12 years and older: [id number] mg every 4-6 hours as needed, not to exceed 4,000 mg in 24 hours. Do not use with other products containing acetaminophen . Avoid alcohol while taking acetaminophen . Stop and seek medical help if you develop skin rash, redness, or blisters.   Naproxen (Aleve): Use for pain or fever. Adults: 220 mg every 8-12 hours as needed, not to exceed 660 mg in 24 hours. Take with food to reduce stomach upset. Do not use with other NSAIDs. Avoid in pregnancy after 30 weeks. Stop if you develop stomach pain, black stools, or signs of bleeding.   Dextromethorphan (Robitussin, Delsym): Use for bothersome cough. Adults and children 12 years and older: 10-20 mg every 4 hours, or 30 mg every 6-8 hours, not to exceed 120 mg in 24 hours. Do not use if taking or recently stopped (within 2 weeks) a monoamine oxidase inhibitor (MAOI). Stop if cough lasts more than 7 days or is accompanied by fever, rash, or headache.   Fluticasone  Nasal Spray (Flonase ): Use for nasal congestion or runny nose. Adults and children 12 years and older: 2 sprays in each nostril once daily for up to 1 week, then 1-2 sprays daily as needed. Only use in the nose. Stop if you develop nosebleeds, persistent nasal pain, or symptoms do not improve in 7 days.  General Advice:  Rest, drink plenty of fluids, and use saline nasal sprays or rinses for comfort.  Symptoms may last up to 2 weeks. Seek medical attention if symptoms worsen, last longer than expected, or if you develop shortness of breath, chest pain, or high fever.

## 2024-03-09 NOTE — Progress Notes (Signed)
 Acute Office Visit  Patient ID: Jessica Stephens, female    DOB: [birth year], 73 y.o.   MRN: [id number]  PCP: Levora Reyes SAUNDERS, MD  Chief Complaint  Patient presents with   Nasal Congestion    Seen Dr.Greene last week for a physical but husband has been sick as well as her son. Husband was negative for flu can covid.Sinus pressure and congestion Friday but felt fine but come the weekend symptoms got progressively worse. Has taken OTC medication but does not seem to be helping. Ear pain, body aches, night sweats,chills,sneezing.    Subjective:     HPI  Discussed the use of AI scribe software for clinical note transcription with the patient, who gave verbal consent to proceed.  History of Present Illness Jessica Stephens is a 73 year old female who presents with ear pain and sinus symptoms.  She has been experiencing significant ear pain today, describing it as severe. She has a history of hearing issues and has been using hearing aids for the past two months, which have been beneficial. Recently, she noticed a decrease in hearing ability, particularly in one ear, and recalls that her audiologist cleaned and adjusted her hearing aid.  She describes a recent onset of sinus symptoms, including nasal drainage and sneezing, which began on Saturday. Her nose was 'oozing' and she experienced frequent sneezing. By Sunday, she continued to have these symptoms along with facial pain and ear discomfort. She also reports a sore throat and drainage, which she describes as yellow when she spits up.  No fever, with a temperature of 98.19F this morning, but she feels achy all over. She has been managing her symptoms with increased fluid intake, specifically orange juice and water, and took a Sudafed on Sunday night to help with sleep, although it did not significantly alleviate her symptoms.  She is concerned about potentially developing bronchitis, a condition she has experienced since having her second  child, and notes that she is not currently coughing. She works at [employer] in a busy environment, which she describes as 'warm' and 'hot with people,' and is concerned about missing work due to her symptoms.      Objective:    BP (!) 157/91   Pulse 77   Temp 98.6 F (37 C) (Oral)   Wt 148 lb (67.1 kg)   BMI 28.90 kg/m   Physical Exam  Gen: Tired appearing woman Ears: Left tympanic membrane is erythematous and edematous with a layering middle ear effusion and abnormal light reflex, right tympanic membrane is a little erythematous but no middle ear effusion Mouth: Bilateral swollen tonsils, erythema in the posterior oropharynx without exudate Neck: Moderate left sided tender cervical lymphadenopathy about 2 cm in size Heart: Regular, no murmur Lungs: Unlabored, clear throughout with no crackles or wheezing     Assessment & Plan:   Problem List Items Addressed This Visit       Unprioritized   URTI (acute upper respiratory infection) - Primary   Symptoms are most consistent with an acute viral upper respiratory tract infection.  Ongoing for about 3 days now.  Swabs for COVID and flu were negative in the office.  She had significant tonsil hypertrophy and a left sided tender adenopathy, so also swabbed her for strep throat which was negative.  The respiratory tract infection is complicated by acute otitis media of the left side which I am going to treat with an antibiotic.  For the rest of her symptoms I recommended  supportive care including intranasal irrigation, Flonase , safe use of Tylenol  and NSAIDs.  Recommended staying out of work for at least another 2 days.      Relevant Orders   POCT rapid strep A   POCT Influenza A/B   POC COVID-19 BinaxNow   Acute otitis media   Symptoms are most consistent with a left acute otitis media due to upper respiratory tract infection.  In her case this is quite significant because it is preventing her from wearing hearing aids.  She is very hard  of hearing and will not be able to work without the use of hearing aids. No signs of otitis externa.  I recommended Augmentin  twice daily for 7 days, can stop early if symptoms resolve.  We talked about sinus irrigation and intranasal steroids to help with drainage of the middle ear effusion.      Relevant Medications   amoxicillin -clavulanate (AUGMENTIN ) 875-125 MG tablet    Meds ordered this encounter  Medications   amoxicillin -clavulanate (AUGMENTIN ) 875-125 MG tablet    Sig: Take 1 tablet by mouth 2 (two) times daily.    Dispense:  20 tablet    Refill:  0    Return if symptoms worsen or fail to improve.  Cleatus Debby Specking, MD [facility]

## 2024-03-09 NOTE — Assessment & Plan Note (Signed)
 Symptoms are most consistent with a left acute otitis media due to upper respiratory tract infection.  In her case this is quite significant because it is preventing her from wearing hearing aids.  She is very hard of hearing and will not be able to work without the use of hearing aids. No signs of otitis externa.  I recommended Augmentin  twice daily for 7 days, can stop early if symptoms resolve.  We talked about sinus irrigation and intranasal steroids to help with drainage of the middle ear effusion.

## 2024-03-09 NOTE — Assessment & Plan Note (Signed)
 Symptoms are most consistent with an acute viral upper respiratory tract infection.  Ongoing for about 3 days now.  Swabs for COVID and flu were negative in the office.  She had significant tonsil hypertrophy and a left sided tender adenopathy, so also swabbed her for strep throat which was negative.  The respiratory tract infection is complicated by acute otitis media of the left side which I am going to treat with an antibiotic.  For the rest of her symptoms I recommended supportive care including intranasal irrigation, Flonase , safe use of Tylenol  and NSAIDs.  Recommended staying out of work for at least another 2 days.

## 2024-03-10 ENCOUNTER — Ambulatory Visit: Admitting: Family Medicine

## 2024-03-10 ENCOUNTER — Ambulatory Visit: Payer: Self-pay | Admitting: Family Medicine

## 2024-03-10 DIAGNOSIS — E785 Hyperlipidemia, unspecified: Secondary | ICD-10-CM

## 2024-03-10 MED ORDER — PRAVASTATIN SODIUM 20 MG PO TABS: 20.0000 mg | ORAL_TABLET | Freq: Every day | ORAL | 1 refills | Status: AC

## 2024-03-10 NOTE — Progress Notes (Signed)
 Called patient and she is okay with changing her cholesterol medication.

## 2024-03-10 NOTE — Telephone Encounter (Signed)
 Pravastatin  20 mg sent to pharmacy, can start intermittently, then increase to daily dosing as tolerated.  Should recheck levels in the next 6 weeks.  Lab visit only for CMP, lipid panel for hyperlipidemia.  Thanks

## 2024-03-10 NOTE — Progress Notes (Signed)
 Patient would like to wait until her visit on the 29th to check her blood levels. She understands that you sent in a statin for her to take. She is confused why the OTC chol medication is not working for her.SABRASABRA

## 2024-04-06 ENCOUNTER — Ambulatory Visit: Admitting: Family Medicine

## 2024-04-06 ENCOUNTER — Encounter: Payer: Self-pay | Admitting: Family Medicine

## 2024-04-06 VITALS — BP 134/58 | HR 80 | Temp 98.9°F | Resp 12 | Ht 60.0 in | Wt 148.4 lb

## 2024-04-06 DIAGNOSIS — J069 Acute upper respiratory infection, unspecified: Secondary | ICD-10-CM | POA: Diagnosis not present

## 2024-04-06 DIAGNOSIS — R0989 Other specified symptoms and signs involving the circulatory and respiratory systems: Secondary | ICD-10-CM | POA: Diagnosis not present

## 2024-04-06 DIAGNOSIS — J029 Acute pharyngitis, unspecified: Secondary | ICD-10-CM | POA: Diagnosis not present

## 2024-04-06 DIAGNOSIS — R051 Acute cough: Secondary | ICD-10-CM

## 2024-04-06 LAB — POCT RAPID STREP A (OFFICE): Rapid Strep A Screen: NEGATIVE

## 2024-04-06 LAB — POC COVID19 BINAXNOW: SARS Coronavirus 2 Ag: NEGATIVE

## 2024-04-06 LAB — POCT INFLUENZA A/B
Influenza A, POC: NEGATIVE
Influenza B, POC: NEGATIVE

## 2024-04-06 MED ORDER — BENZONATATE 100 MG PO CAPS: 100.0000 mg | ORAL_CAPSULE | Freq: Two times a day (BID) | ORAL | 0 refills | Status: AC | PRN

## 2024-04-06 MED ORDER — GUAIFENESIN-CODEINE 100-10 MG/5ML PO SOLN: 5.0000 mL | Freq: Every evening | ORAL | 0 refills | Status: AC | PRN

## 2024-04-06 NOTE — Patient Instructions (Addendum)
 Thank you for coming in today.  I am sorry you are sick, but I suspect you have a virus causing your symptoms.  The flu and COVID test were negative in the office as well as your strep test.  As we discussed early in the course of a flu or COVID infection sometimes that test can be false negative within the first day or so.  I do recommend repeat testing tomorrow morning and if you have a positive flu or COVID test at home, let me know.  There are some medications that are options, but not absolutely necessary.  We certainly can talk about those options if you do have a positive test.  Continue to drink plenty of fluids, rest, Tylenol  as needed.  Saline nasal spray can help with congestion, Flonase  short-term if needed is fine as well.  For sore throat, any sore throat lozenges fine but Cepacol is an option.  For cough, you can try either Mucinex  over-the-counter during the day or Tessalon  Perles which I sent to your pharmacy.  If you need something for cough at bedtime, I did send in some more codeine  cough syrup.  If you experience any shortness of breath, confusion, difficulty with drinking fluids or any acute worsening symptoms please be seen.  I do not expect this to occur.  Hang in there!  Upper Respiratory Infection, Adult An upper respiratory infection (URI) is a common viral infection of the nose, throat, and upper air passages that lead to the lungs. The most common type of URI is the common cold. URIs usually get better on their own, without medical treatment. What are the causes? A URI is caused by a virus. You may catch a virus by: Breathing in droplets from an infected person's cough or sneeze. Touching something that has been exposed to the virus (is contaminated) and then touching your mouth, nose, or eyes. What increases the risk? You are more likely to get a URI if: You are very young or very old. You have close contact with others, such as at work, school, or a health care  facility. You smoke. You have long-term (chronic) heart or lung disease. You have a weakened disease-fighting system (immune system). You have nasal allergies or asthma. You are experiencing a lot of stress. You have poor nutrition. What are the signs or symptoms? A URI usually involves some of the following symptoms: Runny or stuffy (congested) nose. Cough. Sneezing. Sore throat. Headache. Fatigue. Fever. Loss of appetite. Pain in your forehead, behind your eyes, and over your cheekbones (sinus pain). Muscle aches. Redness or irritation of the eyes. Pressure in the ears or face. How is this diagnosed? This condition may be diagnosed based on your medical history and symptoms, and a physical exam. Your health care provider may use a swab to take a mucus sample from your nose (nasal swab). This sample can be tested to determine what virus is causing the illness. How is this treated? URIs usually get better on their own within 7-10 days. Medicines cannot cure URIs, but your health care provider may recommend certain medicines to help relieve symptoms, such as: Over-the-counter cold medicines. Cough suppressants. Coughing is a type of defense against infection that helps to clear the respiratory system, so take these medicines only as recommended by your health care provider. Fever-reducing medicines. Follow these instructions at home: Activity Rest as needed. If you have a fever, stay home from work or school until your fever is gone or until your health  care provider says your URI cannot spread to other people (is no longer contagious). Your health care provider may have you wear a face mask to prevent your infection from spreading. Relieving symptoms Gargle with a mixture of salt and water 3-4 times a day or as needed. To make salt water, completely dissolve -1 tsp (3-6 g) of salt in 1 cup (237 mL) of warm water. Use a cool-mist humidifier to add moisture to the air. This can help  you breathe more easily. Eating and drinking  Drink enough fluid to keep your urine pale yellow. Eat soups and other clear broths. General instructions  Take over-the-counter and prescription medicines only as told by your health care provider. These include cold medicines, fever reducers, and cough suppressants. Do not use any products that contain nicotine or tobacco. These products include cigarettes, chewing tobacco, and vaping devices, such as e-cigarettes. If you need help quitting, ask your health care provider. Stay away from secondhand smoke. Stay up to date on all immunizations, including the yearly (annual) flu vaccine. Keep all follow-up visits. This is important. How to prevent the spread of infection to others URIs can be contagious. To prevent the infection from spreading: Wash your hands with soap and water for at least 20 seconds. If soap and water are not available, use hand sanitizer. Avoid touching your mouth, face, eyes, or nose. Cough or sneeze into a tissue or your sleeve or elbow instead of into your hand or into the air.  Contact a health care provider if: You are getting worse instead of better. You have a fever or chills. Your mucus is brown or red. You have yellow or brown discharge coming from your nose. You have pain in your face, especially when you bend forward. You have swollen neck glands. You have pain while swallowing. You have white areas in the back of your throat. Get help right away if: You have shortness of breath that gets worse. You have severe or persistent: Headache. Ear pain. Sinus pain. Chest pain. You have chronic lung disease along with any of the following: Making high-pitched whistling sounds when you breathe, most often when you breathe out (wheezing). Prolonged cough (more than 14 days). Coughing up blood. A change in your usual mucus. You have a stiff neck. You have changes in  your: Vision. Hearing. Thinking. Mood. These symptoms may be an emergency. Get help right away. Call 911. Do not wait to see if the symptoms will go away. Do not drive yourself to the hospital. Summary An upper respiratory infection (URI) is a common infection of the nose, throat, and upper air passages that lead to the lungs. A URI is caused by a virus. URIs usually get better on their own within 7-10 days. Medicines cannot cure URIs, but your health care provider may recommend certain medicines to help relieve symptoms. This information is not intended to replace advice given to you by your health care provider. Make sure you discuss any questions you have with your health care provider. Document Revised: 10/19/2020 Document Reviewed: 10/19/2020 Elsevier Patient Education  2024 [employer].

## 2024-04-06 NOTE — Progress Notes (Signed)
 "  Subjective:  Patient ID: Jessica Stephens, female    DOB: [birth year]  Age: 74 y.o. MRN: [id number]  CC:  Chief Complaint  Patient presents with   Acute Visit    Sx started yesterday. Cough. Having a hard time swallowing. ST and going into her ears. Facial pain.     HPI Jessica Stephens presents for   Cough, sore throat Symptoms started yesterday morning, headache, sinus congestion, body aches. Sore throat with radiation to her ears, some face pain.  Pain with swallowing. Throat soreness is worst symptom. Left ear is sore. No drainage. More cough since last night.   Sick contacts: multiple sick contacts - husband, son with illness recently.  Attempted treatments: sudafed, tylenol  once yesterday.  Has codeine  cough syrup but it is from 2023.  Tolerated that medicine previously.  History Patient Active Problem List   Diagnosis Date Noted   URTI (acute upper respiratory infection) 03/09/2024   Acute otitis media 03/09/2024   Sinus headache 07/25/2021   Ulcerative colitis (HCC) 11/04/2020   History of colonic polyps 11/04/2020   Age-related osteoporosis without current pathological fracture 03/11/2020   Pelvic prolapse 03/08/2020   Bilateral impacted cerumen 08/04/2019   Dermatitis of ear canal, bilateral 08/04/2019   Left ear hearing loss 08/04/2019   Nasal congestion 08/04/2019   Bilateral sensorineural hearing loss 08/04/2019   Lower urinary tract symptoms (LUTS) 12/05/2016   Acute UTI 12/05/2016   Flank pain, acute 12/05/2016   Prolonged Q-T interval on ECG 08/21/2014   Lobar pneumonia 08/21/2014   CAP (community acquired pneumonia) 08/19/2014   Community acquired pneumonia 08/19/2014   Sepsis (HCC) 08/19/2014   Hypokalemia 08/19/2014   HYPERLIPIDEMIA 05/29/2010   DYSPNEA 05/29/2010   CHEST PAIN, ATYPICAL 05/02/2010   ESOPHAGEAL STRICTURE 04/07/2010   Diaphragmatic hernia 04/07/2010   ULCERATIVE PROCTITIS 04/07/2010   Arthropathy 04/07/2010   LUQ PAIN 04/07/2010    TRANSAMINASES, SERUM, ELEVATED 04/07/2010   VITAMIN B12 DEFICIENCY 01/30/2008   ANXIETY, CHRONIC 01/29/2008   Essential hypertension 01/29/2008   CHRONIC MAXILLARY SINUSITIS 04/24/2007   G E R D 04/24/2007   UNSPECIFIED OSTEOPOROSIS 04/24/2007   ULCERATIVE COLITIS, LEFT SIDED 02/25/2007   Past Medical History:  Diagnosis Date   Abnormal transaminases    Anxiety    Arthritis    Esophageal stricture    GERD (gastroesophageal reflux disease)    Hiatal hernia    Hyperlipidemia    Hypertension    Maxillary sinusitis    OAB (overactive bladder)    Proctitis    Ulcerative colitis (HCC)    left sided   Vitamin B12 deficiency    Past Surgical History:  Procedure Laterality Date   CATARACT EXTRACTION     CESAREAN SECTION     nose surg     NOSE SURGERY     Allergies[1] Prior to Admission medications  Medication Sig Start Date End Date Taking? Authorizing Provider  albuterol  (VENTOLIN  HFA) 108 (90 Base) MCG/ACT inhaler Inhale 2 puffs into the lungs every 4 (four) hours as needed for wheezing or shortness of breath. 03/23/22  Yes Vonna Sharlet POUR, MD  amLODipine  (NORVASC ) 10 MG tablet Take 1 tablet (10 mg total) by mouth daily. 12/31/23  Yes Levora Reyes SAUNDERS, MD  cetirizine  (ZYRTEC  ALLERGY) 10 MG tablet Take 1 tablet (10 mg total) by mouth daily. 12/23/20  Yes Kip Ade, NP  fluticasone  (FLONASE ) 50 MCG/ACT nasal spray SPRAY 2 SPRAYS INTO EACH NOSTRIL EVERY DAY 07/16/23  Yes Levora Reyes SAUNDERS, MD  mesalamine  (LIALDA ) 1.2 g EC tablet Take 1-2 tablets (1.2-2.4 g total) by mouth 2 (two) times daily. 12/23/20  Yes Kip Ade, NP  methocarbamol  (ROBAXIN ) 500 MG tablet Take 1 tablet (500 mg total) by mouth every 12 (twelve) hours as needed for muscle spasms. 12/10/22  Yes Johnie Flaming A, NP  Multiple Vitamins-Minerals (VISION VITAMINS) TABS Take 1 tablet by mouth daily.   Yes [provider]  olmesartan  (BENICAR ) 20 MG tablet TAKE 1 TABLET BY MOUTH EVERY DAY 03/06/24  Yes  Levora Reyes SAUNDERS, MD  Potassium 99 MG TABS Take by mouth.   Yes [provider]  pravastatin  (PRAVACHOL ) 20 MG tablet Take 1 tablet (20 mg total) by mouth daily. Can start once per week and increase to daily as tolerated. 03/10/24  Yes Levora Reyes SAUNDERS, MD  amoxicillin -clavulanate (AUGMENTIN ) 875-125 MG tablet Take 1 tablet by mouth 2 (two) times daily. Patient not taking: Reported on 04/06/2024 03/09/24   Jerrell Cleatus Ned, MD   Social History   Socioeconomic History   Marital status: Married    Spouse name: Not on file   Number of children: Not on file   Years of education: Not on file   Highest education level: Not on file  Occupational History   Not on file  Tobacco Use   Smoking status: Never   Smokeless tobacco: Never  Vaping Use   Vaping status: Never Used  Substance and Sexual Activity   Alcohol use: Yes    Comment: occasionally   Drug use: No   Sexual activity: Not Currently  Other Topics Concern   Not on file  Social History Narrative   Not on file   Social Drivers of Health   Tobacco Use: Low Risk (04/06/2024)   Patient History    Smoking Tobacco Use: Never    Smokeless Tobacco Use: Never    Passive Exposure: Not on file  Financial Resource Strain: Low Risk (01/03/2023)   Overall Financial Resource Strain (CARDIA)    Difficulty of Paying Living Expenses: Not hard at all  Food Insecurity: No Food Insecurity (01/03/2023)   Hunger Vital Sign    Worried About Running Out of Food in the Last Year: Never true    Ran Out of Food in the Last Year: Never true  Transportation Needs: No Transportation Needs (01/03/2023)   PRAPARE - Administrator, Civil Service (Medical): No    Lack of Transportation (Non-Medical): No  Physical Activity: Sufficiently Active (01/03/2023)   Exercise Vital Sign    Days of Exercise per Week: 4 days    Minutes of Exercise per Session: 50 min  Stress: No Stress Concern Present (01/03/2023)   [employer] of  Occupational Health - Occupational Stress Questionnaire    Feeling of Stress : Not at all  Social Connections: Moderately Integrated (01/03/2023)   Social Connection and Isolation Panel    Frequency of Communication with Friends and Family: More than three times a week    Frequency of Social Gatherings with Friends and Family: More than three times a week    Attends Religious Services: Never    Database Administrator or Organizations: Yes    Attends Engineer, Structural: More than 4 times per year    Marital Status: Married  Catering Manager Violence: Not At Risk (01/03/2023)   Humiliation, Afraid, Rape, and Kick questionnaire    Fear of Current or Ex-Partner: No    Emotionally Abused: No    Physically Abused: No  Sexually Abused: No  Depression (PHQ2-9): Low Risk (08/07/2023)   Depression (PHQ2-9)    PHQ-2 Score: 2  Alcohol Screen: Low Risk (01/03/2023)   Alcohol Screen    Last Alcohol Screening Score (AUDIT): 1  Housing: Low Risk (01/03/2023)   Housing    Last Housing Risk Score: 0  Utilities: Not At Risk (01/03/2023)   [facility] Utilities    Threatened with loss of utilities: No  Health Literacy: Adequate Health Literacy (01/03/2023)   B1300 Health Literacy    Frequency of need for help with medical instructions: Never    Review of Systems Per HPI.   Objective:   Vitals:   04/06/24 1330  BP: (!) 134/58  Pulse: 80  Resp: 12  Temp: 98.9 F (37.2 C)  TempSrc: Temporal  SpO2: 98%  Weight: 148 lb 6.4 oz (67.3 kg)  Height: 5' (1.524 m)     Physical Exam Vitals reviewed.  Constitutional:      General: She is not in acute distress.    Appearance: She is well-developed.  HENT:     Head: Normocephalic and atraumatic.     Right Ear: Hearing, tympanic membrane, ear canal and external ear normal. There is no impacted cerumen.     Left Ear: Hearing, tympanic membrane, ear canal and external ear normal. There is no impacted cerumen.     Nose: Nose normal.      Mouth/Throat:     Mouth: Mucous membranes are moist.     Pharynx: No oropharyngeal exudate or posterior oropharyngeal erythema.  Eyes:     Conjunctiva/sclera: Conjunctivae normal.     Pupils: Pupils are equal, round, and reactive to light.  Neck:     Comments: Tender left side without appreciable enlarged nodes . Cardiovascular:     Rate and Rhythm: Normal rate and regular rhythm.     Heart sounds: Normal heart sounds. No murmur heard. Pulmonary:     Effort: Pulmonary effort is normal. No respiratory distress.     Breath sounds: Normal breath sounds. No stridor. No wheezing, rhonchi or rales.  Skin:    General: Skin is warm and dry.     Findings: No rash.  Neurological:     Mental Status: She is alert and oriented to person, place, and time.  Psychiatric:        Mood and Affect: Mood normal.        Behavior: Behavior normal.      Results for orders placed or performed in visit on 04/06/24  POCT Influenza A/B   Collection Time: 04/06/24  1:49 PM  Result Value Ref Range   Influenza A, POC Negative Negative   Influenza B, POC Negative Negative  POC COVID-19 BinaxNow   Collection Time: 04/06/24  1:49 PM  Result Value Ref Range   SARS Coronavirus 2 Ag Negative Negative  POCT rapid strep A   Collection Time: 04/06/24  1:55 PM  Result Value Ref Range   Rapid Strep A Screen Negative Negative     Assessment & Plan:  Jessica Stephens is a 74 y.o. female . Sore throat - Plan: POC COVID-19 BinaxNow  Chest congestion - Plan: POCT Influenza A/B, POCT rapid strep A  Upper respiratory tract infection, unspecified type  Acute cough - Plan: benzonatate  (TESSALON ) 100 MG capsule, guaiFENesin -codeine  100-10 MG/5ML syrup  1 day history of sore throat, cough, nasal congestion, sinus congestion.  Reassuring exam.  I do not appreciate any posterior oropharyngeal erythema or exudate.  Strep testing was negative.  Some discomfort over the left lateral neck but I do not appreciate any  swelling or significant lymphadenopathy.  She is clearing secretions normally, speaking normally.  No stridor.  No sign of effusion or erythema/injection of TMs, likely referred pain from throat or sinuses.  Lungs were clear.  Discussed likely viral illness.  Initial flu and COVID test negative but with symptoms starting yesterday, option of repeat testing at home tomorrow and to call if positive to discuss medication options.   Symptomatic care discussed with fluids, rest, saline nasal spray, Flonase  nasal spray if needed, sore throat lozenges, Cepacol, Tessalon , and codeine  cough syrup if needed at night.  Potential side effects of meds discussed.  RTC precautions given with all questions answered.  Understanding of plan expressed.  Meds ordered this encounter  Medications   benzonatate  (TESSALON ) 100 MG capsule    Sig: Take 1 capsule (100 mg total) by mouth 2 (two) times daily as needed for cough.    Dispense:  20 capsule    Refill:  0   guaiFENesin -codeine  100-10 MG/5ML syrup    Sig: Take 5-10 mLs by mouth at bedtime as needed.    Dispense:  120 mL    Refill:  0   Patient Instructions  Thank you for coming in today.  I am sorry you are sick, but I suspect you have a virus causing your symptoms.  The flu and COVID test were negative in the office as well as your strep test.  As we discussed early in the course of a flu or COVID infection sometimes that test can be false negative within the first day or so.  I do recommend repeat testing tomorrow morning and if you have a positive flu or COVID test at home, let me know.  There are some medications that are options, but not absolutely necessary.  We certainly can talk about those options if you do have a positive test.  Continue to drink plenty of fluids, rest, Tylenol  as needed.  Saline nasal spray can help with congestion, Flonase  short-term if needed is fine as well.  For sore throat, any sore throat lozenges fine but Cepacol is an option.  For  cough, you can try either Mucinex  over-the-counter during the day or Tessalon  Perles which I sent to your pharmacy.  If you need something for cough at bedtime, I did send in some more codeine  cough syrup.  If you experience any shortness of breath, confusion, difficulty with drinking fluids or any acute worsening symptoms please be seen.  I do not expect this to occur.  Hang in there!  Upper Respiratory Infection, Adult An upper respiratory infection (URI) is a common viral infection of the nose, throat, and upper air passages that lead to the lungs. The most common type of URI is the common cold. URIs usually get better on their own, without medical treatment. What are the causes? A URI is caused by a virus. You may catch a virus by: Breathing in droplets from an infected person's cough or sneeze. Touching something that has been exposed to the virus (is contaminated) and then touching your mouth, nose, or eyes. What increases the risk? You are more likely to get a URI if: You are very young or very old. You have close contact with others, such as at work, school, or a health care facility. You smoke. You have long-term (chronic) heart or lung disease. You have a weakened disease-fighting system (immune system). You have nasal allergies or asthma.  You are experiencing a lot of stress. You have poor nutrition. What are the signs or symptoms? A URI usually involves some of the following symptoms: Runny or stuffy (congested) nose. Cough. Sneezing. Sore throat. Headache. Fatigue. Fever. Loss of appetite. Pain in your forehead, behind your eyes, and over your cheekbones (sinus pain). Muscle aches. Redness or irritation of the eyes. Pressure in the ears or face. How is this diagnosed? This condition may be diagnosed based on your medical history and symptoms, and a physical exam. Your health care provider may use a swab to take a mucus sample from your nose (nasal swab). This sample can  be tested to determine what virus is causing the illness. How is this treated? URIs usually get better on their own within 7-10 days. Medicines cannot cure URIs, but your health care provider may recommend certain medicines to help relieve symptoms, such as: Over-the-counter cold medicines. Cough suppressants. Coughing is a type of defense against infection that helps to clear the respiratory system, so take these medicines only as recommended by your health care provider. Fever-reducing medicines. Follow these instructions at home: Activity Rest as needed. If you have a fever, stay home from work or school until your fever is gone or until your health care provider says your URI cannot spread to other people (is no longer contagious). Your health care provider may have you wear a face mask to prevent your infection from spreading. Relieving symptoms Gargle with a mixture of salt and water 3-4 times a day or as needed. To make salt water, completely dissolve -1 tsp (3-6 g) of salt in 1 cup (237 mL) of warm water. Use a cool-mist humidifier to add moisture to the air. This can help you breathe more easily. Eating and drinking  Drink enough fluid to keep your urine pale yellow. Eat soups and other clear broths. General instructions  Take over-the-counter and prescription medicines only as told by your health care provider. These include cold medicines, fever reducers, and cough suppressants. Do not use any products that contain nicotine or tobacco. These products include cigarettes, chewing tobacco, and vaping devices, such as e-cigarettes. If you need help quitting, ask your health care provider. Stay away from secondhand smoke. Stay up to date on all immunizations, including the yearly (annual) flu vaccine. Keep all follow-up visits. This is important. How to prevent the spread of infection to others URIs can be contagious. To prevent the infection from spreading: Wash your hands with soap  and water for at least 20 seconds. If soap and water are not available, use hand sanitizer. Avoid touching your mouth, face, eyes, or nose. Cough or sneeze into a tissue or your sleeve or elbow instead of into your hand or into the air.  Contact a health care provider if: You are getting worse instead of better. You have a fever or chills. Your mucus is brown or red. You have yellow or brown discharge coming from your nose. You have pain in your face, especially when you bend forward. You have swollen neck glands. You have pain while swallowing. You have white areas in the back of your throat. Get help right away if: You have shortness of breath that gets worse. You have severe or persistent: Headache. Ear pain. Sinus pain. Chest pain. You have chronic lung disease along with any of the following: Making high-pitched whistling sounds when you breathe, most often when you breathe out (wheezing). Prolonged cough (more than 14 days). Coughing up blood. A change  in your usual mucus. You have a stiff neck. You have changes in your: Vision. Hearing. Thinking. Mood. These symptoms may be an emergency. Get help right away. Call 911. Do not wait to see if the symptoms will go away. Do not drive yourself to the hospital. Summary An upper respiratory infection (URI) is a common infection of the nose, throat, and upper air passages that lead to the lungs. A URI is caused by a virus. URIs usually get better on their own within 7-10 days. Medicines cannot cure URIs, but your health care provider may recommend certain medicines to help relieve symptoms. This information is not intended to replace advice given to you by your health care provider. Make sure you discuss any questions you have with your health care provider. Document Revised: 10/19/2020 Document Reviewed: 10/19/2020 Elsevier Patient Education  2024 [employer].    Signed,   Reyes Pines, MD [facility],  [facility] 04/06/2024 2:38 PM       [1]  Allergies Allergen Reactions   Alendronate Sodium    Other Other (See Comments)    messes up blood    Sulfonamide Derivatives    "

## 2024-04-08 ENCOUNTER — Ambulatory Visit (HOSPITAL_COMMUNITY)
Admission: EM | Admit: 2024-04-08 | Discharge: 2024-04-08 | Disposition: A | Discharge status: Home / Self Care | Attending: Family Medicine | Admitting: Family Medicine

## 2024-04-08 ENCOUNTER — Other Ambulatory Visit: Payer: Self-pay

## 2024-04-08 NOTE — Progress Notes (Signed)
 Jessica Stephens                                          MRN: [id number]   04/08/2024   The [facility] Quality Team Specialist reviewed this patient medical record for the purposes of chart review for care gap closure. The following were reviewed: chart review for care gap closure-controlling blood pressure.    [facility] Quality Team

## 2024-04-08 NOTE — ED Notes (Signed)
 Removed inaccurate documentation from chart.  This patient was never located to perform intake duties.  Any documentation from 04/08/2024 with exception of not being able to locate patient is not current

## 2024-04-08 NOTE — ED Notes (Signed)
 No answer in lobby  No answer to phone number

## 2024-04-30 ENCOUNTER — Ambulatory Visit: Admitting: Family Medicine

## 2024-07-16 ENCOUNTER — Ambulatory Visit: Admitting: Diagnostic Neuroimaging
